# Patient Record
Sex: Female | Born: 1945
Health system: Southern US, Community
[De-identification: ages and names within clinical notes are randomized; demographics above are authoritative.]

## PROBLEM LIST (undated history)

## (undated) DIAGNOSIS — T7840XA Allergy, unspecified, initial encounter: Secondary | ICD-10-CM

## (undated) DIAGNOSIS — E785 Hyperlipidemia, unspecified: Secondary | ICD-10-CM

## (undated) DIAGNOSIS — I493 Ventricular premature depolarization: Secondary | ICD-10-CM

## (undated) DIAGNOSIS — E669 Obesity, unspecified: Secondary | ICD-10-CM

## (undated) DIAGNOSIS — I10 Essential (primary) hypertension: Secondary | ICD-10-CM

## (undated) DIAGNOSIS — M8580 Other specified disorders of bone density and structure, unspecified site: Secondary | ICD-10-CM

## (undated) HISTORY — PX: TMJ ARTHROPLASTY: SHX1066

## (undated) HISTORY — DX: Allergy, unspecified, initial encounter: T78.40XA

## (undated) HISTORY — DX: Other specified disorders of bone density and structure, unspecified site: M85.80

## (undated) HISTORY — DX: Essential (primary) hypertension: I10

## (undated) HISTORY — DX: Hyperlipidemia, unspecified: E78.5

## (undated) HISTORY — DX: Ventricular premature depolarization: I49.3

## (undated) HISTORY — PX: ABDOMINAL HYSTERECTOMY: SHX81

## (undated) HISTORY — PX: TEMPOROMANDIBULAR JOINT SURGERY: SHX35

## (undated) HISTORY — PX: FOOT SURGERY: SHX648

## (undated) HISTORY — DX: Obesity, unspecified: E66.9

## (undated) HISTORY — PX: APPENDECTOMY: SHX54

---

## 1998-10-03 ENCOUNTER — Other Ambulatory Visit: Admission: RE | Admit: 1998-10-03 | Discharge: 1998-10-03 | Payer: Self-pay | Admitting: Family Medicine

## 2002-12-11 ENCOUNTER — Other Ambulatory Visit: Admission: RE | Admit: 2002-12-11 | Discharge: 2002-12-11 | Payer: Self-pay | Admitting: Family Medicine

## 2012-02-25 LAB — HM DEXA SCAN: HM Dexa Scan: NORMAL

## 2012-10-28 ENCOUNTER — Ambulatory Visit (INDEPENDENT_AMBULATORY_CARE_PROVIDER_SITE_OTHER): Payer: Medicare HMO | Admitting: Nurse Practitioner

## 2012-10-28 ENCOUNTER — Encounter: Payer: Self-pay | Admitting: Nurse Practitioner

## 2012-10-28 VITALS — BP 130/61 | HR 62 | Temp 98.4°F | Ht 65.0 in | Wt 173.0 lb

## 2012-10-28 DIAGNOSIS — J309 Allergic rhinitis, unspecified: Secondary | ICD-10-CM | POA: Insufficient documentation

## 2012-10-28 DIAGNOSIS — E785 Hyperlipidemia, unspecified: Secondary | ICD-10-CM | POA: Insufficient documentation

## 2012-10-28 DIAGNOSIS — I10 Essential (primary) hypertension: Secondary | ICD-10-CM | POA: Insufficient documentation

## 2012-10-28 NOTE — Progress Notes (Signed)
Subjective:    Patient ID: Amanda Park, female    DOB: 1946-06-10, 67 y.o.   MRN: [id number]  HPI    Review of Systems     Objective:   Physical Exam        Assessment & Plan:   Subjective:     Amanda Park is a 67 y.o. female.  Chief Complaint: Dyslipidemia Patient presents for evaluation of lipids. Compliance with treatment thus far has been excellent. A repeat fasting lipid profile was done. The patient does use medications that may worsen dyslipidemias (corticosteroids, progestins, anabolic steroids, diuretics, beta-blockers, amiodarone, cyclosporine, olanzapine). The patient exercises frequently.  The patient is not known to have coexisting coronary artery disease.   Cardiac Risk Factors Age > 45-female, > 55-female:  YES  +1  Smoking:   NO  Sig. family hx of CHD*:  YES  +1  Hypertension:   YES  +1  Diabetes:   NO  HDL < 35:   NO  HDL > 59:   NO  Total: 3  *- Significant family history of Coronary Heart Disease per National Cholesterol Education Program = Myocardial Infarction or sudden death at less than 77 years old in  father or other 1st-degree female relative, or less than 34 years old in mother or  other 1st-degree female relative.  Hypertension Patient is here for follow-up of elevated blood pressure. She is exercising and is adherent to a low-salt diet. Blood pressure is well controlled at home. Cardiac symptoms: irregular heart beat. Patient denies chest pain, dyspnea, lower extremity edema, near-syncope and syncope. Cardiovascular risk factors: dyslipidemia and family history of premature cardiovascular disease. Use of agents associated with hypertension: none. History of target organ damage: none. Additional Complaints: Arrhythmia Patient presents for evaluation of fast heart rate. Onset was 3 years ago, and patient reports symptoms have stabilized since that time. Patient also complains of none. The patient denies chest pain, dizziness, fatigue, leg  swelling and palpitations. The patient has a past history of CAD. The patient denies a past history of atrial fibrillation.  The following portions of the patient's history were reviewed and updated as appropriate: allergies, current medications, past family history, past medical history, past social history, past surgical history and problem list.  Review of Systems Pertinent items are noted in HPI.    Objective:    BP 130/61  Pulse 62  Temp(Src) 98.4 F (36.9 C) (Oral)  Ht 5\' 5"  (1.651 m)  Wt 173 lb (78.472 kg)  BMI 28.79 kg/m2  General Appearance:    Alert, cooperative, no distress, appears stated age  Head:    Normocephalic, without obvious abnormality, atraumatic  Eyes:    PERRL, conjunctiva/corneas clear, EOM's intact, fundi    benign, both eyes  Ears:    Normal TM's and external ear canals, both ears  Nose:   Nares normal, septum midline, mucosa normal, no drainage    or sinus tenderness  Throat:   Lips, mucosa, and tongue normal; teeth and gums normal  Neck:   Supple, symmetrical, trachea midline, no adenopathy;    thyroid:  no enlargement/tenderness/nodules; no carotid   bruit or JVD  Back:     Symmetric, no curvature, ROM normal, no CVA tenderness  Lungs:     Clear to auscultation bilaterally, respirations unlabored  Chest Wall:    No tenderness or deformity   Heart:    Regular rate and rhythm, S1 and S2 normal, no murmur, rub   or gallop  Breast Exam:  No tenderness, masses, or nipple abnormality  Abdomen:     Soft, non-tender, bowel sounds active all four quadrants,    no masses, no organomegaly  Genitalia:    Normal female without lesion, discharge or tenderness  Rectal:    Normal tone, normal prostate, no masses or tenderness;   guaiac negative stool  Extremities:   Extremities normal, atraumatic, no cyanosis or edema  Pulses:   2+ and symmetric all extremities  Skin:   Skin color, texture, turgor normal, no rashes or lesions  Lymph nodes:   Cervical,  supraclavicular, and axillary nodes normal  Neurologic:   CNII-XII intact, normal strength, sensation and reflexes    throughout      Assessment:     1. Essential hypertension, benign   2. Other and unspecified hyperlipidemia   3. Allergic rhinitis    4. Sinus Tachycardia       Plan:    CONTINUE ALL MED    Reviewed labs Diet and exercise encouraged  Mary-Margaret Daphine Deutscher, FNP

## 2012-10-28 NOTE — Patient Instructions (Signed)

## 2012-12-29 ENCOUNTER — Other Ambulatory Visit: Payer: Self-pay | Admitting: Family Medicine

## 2013-01-02 MED ORDER — SIMVASTATIN 40 MG PO TABS: ORAL_TABLET | ORAL | Status: DC

## 2013-01-02 MED ORDER — PRAMIPEXOLE DIHYDROCHLORIDE 0.25 MG PO TABS: ORAL_TABLET | ORAL | Status: DC

## 2013-01-02 MED ORDER — ATENOLOL 25 MG PO TABS: 25.0000 mg | ORAL_TABLET | Freq: Every day | ORAL | Status: DC

## 2013-01-02 MED ORDER — OMEPRAZOLE 40 MG PO CPDR: 40.0000 mg | DELAYED_RELEASE_CAPSULE | Freq: Every day | ORAL | Status: DC

## 2013-01-02 NOTE — Telephone Encounter (Signed)
LAST OV 4/14. MAIL ORDER AND WE CAN SEND IN ELECTRONICALLY NOW.  CALLED PT AND VERIFIED MEDS WITH PATIENT. LAST LABS 3/14.

## 2013-03-09 ENCOUNTER — Telehealth: Payer: Self-pay | Admitting: Nurse Practitioner

## 2013-03-09 ENCOUNTER — Ambulatory Visit (INDEPENDENT_AMBULATORY_CARE_PROVIDER_SITE_OTHER): Payer: Medicare HMO | Admitting: Nurse Practitioner

## 2013-03-09 ENCOUNTER — Encounter: Payer: Self-pay | Admitting: Nurse Practitioner

## 2013-03-09 VITALS — BP 149/70 | HR 59 | Temp 97.4°F | Ht 65.0 in | Wt 173.0 lb

## 2013-03-09 DIAGNOSIS — L255 Unspecified contact dermatitis due to plants, except food: Secondary | ICD-10-CM

## 2013-03-09 DIAGNOSIS — E785 Hyperlipidemia, unspecified: Secondary | ICD-10-CM

## 2013-03-09 DIAGNOSIS — I10 Essential (primary) hypertension: Secondary | ICD-10-CM

## 2013-03-09 DIAGNOSIS — L237 Allergic contact dermatitis due to plants, except food: Secondary | ICD-10-CM

## 2013-03-09 MED ORDER — METHYLPREDNISOLONE ACETATE 80 MG/ML IJ SUSP
80.0000 mg | Freq: Once | INTRAMUSCULAR | Status: AC
  Administered 2013-03-09: 80 mg via INTRAMUSCULAR

## 2013-03-09 MED ORDER — PREDNISONE 20 MG PO TABS: ORAL_TABLET | ORAL | Status: DC

## 2013-03-09 NOTE — Telephone Encounter (Signed)
Appointment given for today at 11:45 with Amanda Park

## 2013-03-09 NOTE — Progress Notes (Signed)
  Subjective:    Patient ID: Amanda Park, female    DOB: Feb 05, 1946, 68 y.o.   MRN: [id number]  HPI  [place] in with a rash that started last Wednesday- She has been doing yard work but doesn't recall getting into anything- Rash is spreading and itching.    Review of Systems  All other systems reviewed and are negative.       Objective:   Physical Exam  Constitutional: She appears well-developed and well-nourished.  Cardiovascular: Normal rate and normal heart sounds.   Pulmonary/Chest: Effort normal and breath sounds normal.  Skin:  Erythematous maculopapular lesions in linear pattern on forearms and patchy areas on anterior and posterior trunk     BP 149/70  Pulse 59  Temp(Src) 97.4 F (36.3 C) (Oral)  Ht 5\' 5"  (1.651 m)  Wt 173 lb (78.472 kg)  BMI 28.79 kg/m2      Assessment & Plan:  1. Contact dermatitis due to poison oak Avoid scratching Calamine lotion if helps Cool compresses if helps RTO prn - methylPREDNISolone acetate (DEPO-MEDROL) injection 80 mg; Inject 1 mL (80 mg total) into the muscle once.   Mary-Margaret Daphine Deutscher, FNP  - predniSONE (DELTASONE) 20 MG tablet; 2 PO at same time daily for 5 days.-Do not start until 03/10/13  Dispense: 10 tablet; Refill: 0

## 2013-03-09 NOTE — Patient Instructions (Signed)
Poison Ivy Poison ivy is a inflammation of the skin (contact dermatitis) caused by touching the allergens on the leaves of the ivy plant following previous exposure to the plant. The rash usually appears 48 hours after exposure. The rash is usually bumps (papules) or blisters (vesicles) in a linear pattern. Depending on your own sensitivity, the rash may simply cause redness and itching, or it may also progress to blisters which may break open. These must be well cared for to prevent secondary bacterial (germ) infection, followed by scarring. Keep any open areas dry, clean, dressed, and covered with an antibacterial ointment if needed. The eyes may also get puffy. The puffiness is worst in the morning and gets better as the day progresses. This dermatitis usually heals without scarring, within 2 to 3 weeks without treatment. HOME CARE INSTRUCTIONS  Thoroughly wash with soap and water as soon as you have been exposed to poison ivy. You have about one half hour to remove the plant resin before it will cause the rash. This washing will destroy the oil or antigen on the skin that is causing, or will cause, the rash. Be sure to wash under your fingernails as any plant resin there will continue to spread the rash. Do not rub skin vigorously when washing affected area. Poison ivy cannot spread if no oil from the plant remains on your body. A rash that has progressed to weeping sores will not spread the rash unless you have not washed thoroughly. It is also important to wash any clothes you have been wearing as these may carry active allergens. The rash will return if you wear the unwashed clothing, even several days later. Avoidance of the plant in the future is the best measure. Poison ivy plant can be recognized by the number of leaves. Generally, poison ivy has three leaves with flowering branches on a single stem. Diphenhydramine may be purchased over the counter and used as needed for itching. Do not drive with  this medication if it makes you drowsy.Ask your caregiver about medication for children. SEEK MEDICAL CARE IF:  Open sores develop.  Redness spreads beyond area of rash.  You notice purulent (pus-like) discharge.  You have increased pain.  Other signs of infection develop (such as fever). Document Released: 07/10/2000 Document Revised: 10/05/2011 Document Reviewed: 05/29/2009 Exit[employer] Patient Information 2014 [employer].  

## 2013-03-10 LAB — NMR, LIPOPROFILE
HDL Particle Number: 39.5 umol/L (ref >=30.5)
LDLC SERPL CALC-MCNC: 83 mg/dL (ref <100)
LP-IR Score: 68 — ABNORMAL HIGH (ref <=45)
Small LDL Particle Number: 652 nmol/L — ABNORMAL HIGH (ref <=527)

## 2013-03-10 LAB — CMP14+EGFR
ALT: 15 IU/L (ref 0–32)
AST: 15 IU/L (ref 0–40)
Alkaline Phosphatase: 69 IU/L (ref 39–117)
BUN/Creatinine Ratio: 15 (ref 11–26)
CO2: 22 mmol/L (ref 18–29)
Creatinine, Ser: 0.99 mg/dL (ref 0.57–1.00)
Globulin, Total: 1.7 g/dL (ref 1.5–4.5)
Potassium: 4.4 mmol/L (ref 3.5–5.2)
Sodium: 142 mmol/L (ref 134–144)

## 2013-03-14 ENCOUNTER — Other Ambulatory Visit: Payer: Self-pay

## 2013-03-14 NOTE — Telephone Encounter (Signed)
Last seen 03/09/13 and last labs 03/09/13  MMM   If approved print and have nurse call patient to pick up for mail order

## 2013-03-15 MED ORDER — ATENOLOL 25 MG PO TABS: 25.0000 mg | ORAL_TABLET | Freq: Every day | ORAL | Status: DC

## 2013-03-15 MED ORDER — OMEPRAZOLE 40 MG PO CPDR: 40.0000 mg | DELAYED_RELEASE_CAPSULE | Freq: Every day | ORAL | Status: DC

## 2013-03-15 MED ORDER — SIMVASTATIN 40 MG PO TABS: ORAL_TABLET | ORAL | Status: DC

## 2013-03-15 MED ORDER — PRAMIPEXOLE DIHYDROCHLORIDE 0.25 MG PO TABS: ORAL_TABLET | ORAL | Status: DC

## 2013-03-21 ENCOUNTER — Encounter: Payer: Self-pay | Admitting: Nurse Practitioner

## 2013-03-21 ENCOUNTER — Ambulatory Visit (INDEPENDENT_AMBULATORY_CARE_PROVIDER_SITE_OTHER): Payer: Medicare HMO | Admitting: Nurse Practitioner

## 2013-03-21 VITALS — BP 140/68 | HR 61 | Temp 97.3°F | Ht 65.0 in | Wt 166.0 lb

## 2013-03-21 DIAGNOSIS — I10 Essential (primary) hypertension: Secondary | ICD-10-CM

## 2013-03-21 DIAGNOSIS — Z0000 Encounter for general adult medical examination without abnormal findings: Secondary | ICD-10-CM

## 2013-03-21 DIAGNOSIS — R300 Dysuria: Secondary | ICD-10-CM

## 2013-03-21 DIAGNOSIS — E785 Hyperlipidemia, unspecified: Secondary | ICD-10-CM

## 2013-03-21 LAB — POCT URINALYSIS DIPSTICK
Blood, UA: NEGATIVE
Glucose, UA: NEGATIVE
Leukocytes, UA: NEGATIVE
Nitrite, UA: NEGATIVE
Urobilinogen, UA: NEGATIVE
pH, UA: 5

## 2013-03-21 LAB — POCT UA - MICROSCOPIC ONLY: Crystals, Ur, HPF, POC: NEGATIVE

## 2013-03-21 NOTE — Patient Instructions (Signed)

## 2013-03-21 NOTE — Progress Notes (Signed)
Subjective:    Patient ID: Amanda Park, female    DOB: [birth year], 67 y.o.   MRN: [id number]  Urinary Tract Infection  This is a new problem. The current episode started in the past 7 days. The problem occurs intermittently. The problem has been resolved. The quality of the pain is described as aching. The pain is at a severity of 3/10. The pain is mild. There has been no fever. She is not sexually active. There is no history of pyelonephritis. Associated symptoms include urgency. She has tried nothing for the symptoms.  Hypertension This is a chronic problem. The current episode started more than 1 year ago. The problem is unchanged. The problem is controlled. Pertinent negatives include no chest pain, headaches, malaise/fatigue, palpitations, peripheral edema or shortness of breath. There are no associated agents to hypertension. Risk factors for coronary artery disease include dyslipidemia, family history and post-menopausal state. Past treatments include beta blockers. The current treatment provides moderate improvement. There are no compliance problems.   Hyperlipidemia This is a chronic problem. The current episode started more than 1 year ago. The problem is controlled. Recent lipid tests were reviewed and are normal. She has no history of diabetes, hypothyroidism, liver disease or obesity. Pertinent negatives include no chest pain or shortness of breath. Current antihyperlipidemic treatment includes statins. The current treatment provides moderate improvement of lipids. There are no compliance problems.  Risk factors for coronary artery disease include family history and hypertension.  GERD Omeprazole daily keeps symptoms under control RLS mirapex works great- If she forgets to take it she has to get up in the middle of the night and walk the floor.  Review of Systems  Constitutional: Negative for malaise/fatigue.  Respiratory: Negative for shortness of breath.   Cardiovascular: Negative  for chest pain and palpitations.  Genitourinary: Positive for urgency.  Neurological: Negative for headaches.  All other systems reviewed and are negative.       Objective:   Physical Exam  Constitutional: She is oriented to person, place, and time. She appears well-developed and well-nourished.  HENT:  Nose: Nose normal.  Mouth/Throat: Oropharynx is clear and moist.  Eyes: EOM are normal.  Neck: Trachea normal, normal range of motion and full passive range of motion without pain. Neck supple. No JVD present. Carotid bruit is not present. No thyromegaly present.  Cardiovascular: Normal rate, regular rhythm, normal heart sounds and intact distal pulses.  Exam reveals no gallop and no friction rub.   No murmur heard. Pulmonary/Chest: Effort normal and breath sounds normal.  Abdominal: Soft. Bowel sounds are normal. She exhibits no distension and no mass. There is no tenderness.  Musculoskeletal: Normal range of motion.  Lymphadenopathy:    She has no cervical adenopathy.  Neurological: She is alert and oriented to person, place, and time. She has normal reflexes.  Skin: Skin is warm and dry.  Psychiatric: She has a normal mood and affect. Her behavior is normal. Judgment and thought content normal.   BP 140/68  Pulse 61  Temp(Src) 97.3 F (36.3 C) (Oral)  Ht 5\' 5"  (1.651 m)  Wt 166 lb (75.297 kg)  BMI 27.62 kg/m2  Results for orders placed in visit on 03/21/13  POCT URINALYSIS DIPSTICK      Result Value Range   Color, UA yellow     Clarity, UA clear     Glucose, UA neg     Bilirubin, UA neg     Ketones, UA neg     Spec Grav,  UA 1.015     Blood, UA neg     pH, UA 5.0     Protein, UA neg     Urobilinogen, UA negative     Nitrite, UA neg     Leukocytes, UA Negative    POCT UA - MICROSCOPIC ONLY      Result Value Range   WBC, Ur, HPF, POC occ     RBC, urine, microscopic 2-10     Bacteria, U Microscopic few     Mucus, UA trace     Epithelial cells, urine per micros  occ     Crystals, Ur, HPF, POC neg     Casts, Ur, LPF, POC occ     Yeast, UA neg           Assessment & Plan:  1. Annual physical exam Discussed labs with patient at appointment Health maintenance reviewed  2. Dysuria Urine clear Force fluids - POCT urinalysis dipstick - POCT UA - Microscopic Only  3. Hypertension Low NA+ diet  4. Hyperlipidemia Low fat diet and exercise  Mary-Margaret Daphine Deutscher, FNP

## 2013-06-19 ENCOUNTER — Other Ambulatory Visit (INDEPENDENT_AMBULATORY_CARE_PROVIDER_SITE_OTHER): Payer: Medicare HMO

## 2013-06-19 DIAGNOSIS — I10 Essential (primary) hypertension: Secondary | ICD-10-CM

## 2013-06-19 DIAGNOSIS — E785 Hyperlipidemia, unspecified: Secondary | ICD-10-CM

## 2013-06-20 LAB — CMP14+EGFR
Albumin: 4.3 g/dL (ref 3.6–4.8)
BUN: 15 mg/dL (ref 8–27)
CO2: 23 mmol/L (ref 18–29)
Chloride: 107 mmol/L (ref 97–108)
Glucose: 105 mg/dL — ABNORMAL HIGH (ref 65–99)
Total Protein: 6 g/dL (ref 6.0–8.5)

## 2013-06-20 LAB — NMR, LIPOPROFILE
Cholesterol: 151 mg/dL (ref <200)
HDL Cholesterol by NMR: 46 mg/dL (ref >=40)
LDL Particle Number: 1242 nmol/L — ABNORMAL HIGH (ref <1000)
LDLC SERPL CALC-MCNC: 85 mg/dL (ref <100)
Triglycerides by NMR: 100 mg/dL (ref <150)

## 2013-06-26 ENCOUNTER — Other Ambulatory Visit: Payer: Self-pay | Admitting: Nurse Practitioner

## 2013-06-27 ENCOUNTER — Telehealth: Payer: Self-pay | Admitting: *Deleted

## 2013-06-27 MED ORDER — OMEPRAZOLE 40 MG PO CPDR: 40.0000 mg | DELAYED_RELEASE_CAPSULE | Freq: Every day | ORAL | Status: DC

## 2013-06-27 MED ORDER — SIMVASTATIN 40 MG PO TABS: ORAL_TABLET | ORAL | Status: DC

## 2013-06-27 MED ORDER — PRAMIPEXOLE DIHYDROCHLORIDE 0.25 MG PO TABS: ORAL_TABLET | ORAL | Status: DC

## 2013-06-27 MED ORDER — ATENOLOL 25 MG PO TABS: 25.0000 mg | ORAL_TABLET | Freq: Every day | ORAL | Status: DC

## 2013-06-27 NOTE — Telephone Encounter (Signed)
rx ready to pick up

## 2013-06-27 NOTE — Telephone Encounter (Signed)
Wants these printed for mail order, route to nurse to call her when ready

## 2013-06-27 NOTE — Telephone Encounter (Signed)
Message copied by Baltazar Apo on Tue Jun 27, 2013 11:44 AM ------      Message from: Bennie Pierini      Created: Wed Jun 21, 2013  2:19 PM       All labs look good- continue all meds and recheck in 3 months ------

## 2013-06-27 NOTE — Telephone Encounter (Signed)
Patient aware to pick up 

## 2013-06-27 NOTE — Telephone Encounter (Signed)
Pt notified of labs

## 2013-06-28 ENCOUNTER — Other Ambulatory Visit: Payer: Self-pay | Admitting: Nurse Practitioner

## 2013-09-18 ENCOUNTER — Other Ambulatory Visit: Payer: Self-pay | Admitting: Nurse Practitioner

## 2013-09-22 ENCOUNTER — Telehealth: Payer: Self-pay | Admitting: Nurse Practitioner

## 2013-12-13 ENCOUNTER — Ambulatory Visit (INDEPENDENT_AMBULATORY_CARE_PROVIDER_SITE_OTHER): Payer: Medicare HMO | Admitting: Nurse Practitioner

## 2013-12-13 ENCOUNTER — Encounter: Payer: Self-pay | Admitting: Nurse Practitioner

## 2013-12-13 VITALS — BP 136/72 | HR 61 | Temp 97.8°F | Ht 65.0 in | Wt 169.0 lb

## 2013-12-13 DIAGNOSIS — K219 Gastro-esophageal reflux disease without esophagitis: Secondary | ICD-10-CM | POA: Insufficient documentation

## 2013-12-13 DIAGNOSIS — J309 Allergic rhinitis, unspecified: Secondary | ICD-10-CM

## 2013-12-13 DIAGNOSIS — I10 Essential (primary) hypertension: Secondary | ICD-10-CM

## 2013-12-13 DIAGNOSIS — G2581 Restless legs syndrome: Secondary | ICD-10-CM

## 2013-12-13 DIAGNOSIS — E785 Hyperlipidemia, unspecified: Secondary | ICD-10-CM

## 2013-12-13 MED ORDER — ATENOLOL 25 MG PO TABS: 25.0000 mg | ORAL_TABLET | Freq: Every day | ORAL | Status: DC

## 2013-12-13 MED ORDER — OMEPRAZOLE 40 MG PO CPDR: DELAYED_RELEASE_CAPSULE | ORAL | Status: DC

## 2013-12-13 MED ORDER — CETIRIZINE HCL 10 MG PO TABS: 10.0000 mg | ORAL_TABLET | Freq: Every day | ORAL | Status: DC

## 2013-12-13 MED ORDER — PRAMIPEXOLE DIHYDROCHLORIDE 0.25 MG PO TABS: ORAL_TABLET | ORAL | Status: DC

## 2013-12-13 MED ORDER — SIMVASTATIN 40 MG PO TABS: ORAL_TABLET | ORAL | Status: DC

## 2013-12-13 NOTE — Progress Notes (Signed)
Subjective:    Patient ID: Amanda Park, female    DOB: 02/08/1946, 68 y.o.   MRN: [id number]  Hypertension This is a chronic problem. The current episode started more than 1 year ago. The problem has been resolved since onset. The problem is controlled. Pertinent negatives include no chest pain, headaches, neck pain, palpitations, peripheral edema or shortness of breath. There are no associated agents to hypertension. Risk factors for coronary artery disease include dyslipidemia, post-menopausal state and sedentary lifestyle. Past treatments include beta blockers. The current treatment provides mild improvement. Compliance problems include diet and exercise.   Hyperlipidemia This is a chronic problem. The current episode started more than 1 year ago. The problem is uncontrolled. Recent lipid tests were reviewed and are high. She has no history of diabetes, hypothyroidism or obesity. There are no known factors aggravating her hyperlipidemia. Pertinent negatives include no chest pain or shortness of breath. Current antihyperlipidemic treatment includes statins. The current treatment provides mild improvement of lipids. Compliance problems include adherence to diet and adherence to exercise.  Risk factors for coronary artery disease include dyslipidemia, hypertension and post-menopausal.  RLS Mirapex daily helps with symptoms GERD Omeprazole keeps symtoms under control  Review of Systems  Respiratory: Negative for shortness of breath.   Cardiovascular: Negative for chest pain and palpitations.  Musculoskeletal: Negative for neck pain.  Neurological: Negative for headaches.       Objective:   Physical Exam  Constitutional: She is oriented to person, place, and time. She appears well-developed and well-nourished.  HENT:  Nose: Nose normal.  Mouth/Throat: Oropharynx is clear and moist.  Eyes: EOM are normal.  Neck: Trachea normal, normal range of motion and full passive range of motion  without pain. Neck supple. No JVD present. Carotid bruit is not present. No thyromegaly present.  Cardiovascular: Normal rate, regular rhythm, normal heart sounds and intact distal pulses.  Exam reveals no gallop and no friction rub.   No murmur heard. Pulmonary/Chest: Effort normal and breath sounds normal.  Abdominal: Soft. Bowel sounds are normal. She exhibits no distension and no mass. There is no tenderness.  Musculoskeletal: Normal range of motion.  Lymphadenopathy:    She has no cervical adenopathy.  Neurological: She is alert and oriented to person, place, and time. She has normal reflexes.  Skin: Skin is warm and dry.  Psychiatric: She has a normal mood and affect. Her behavior is normal. Judgment and thought content normal.   BP 136/72  Pulse 61  Temp(Src) 97.8 F (36.6 C) (Oral)  Ht 5\' 5"  (1.651 m)  Wt 169 lb (76.658 kg)  BMI 28.12 kg/m2        Assessment & Plan:  1. Hyperlipidemia LDL goal < 100 Low fat diet and exercise - simvastatin (ZOCOR) 40 MG tablet; TAKE ONE DAILY  Dispense: 90 tablet; Refill: 1  2. Essential hypertension, benign Low NA+ diet - atenolol (TENORMIN) 25 MG tablet; Take 1 tablet (25 mg total) by mouth daily.  Dispense: 90 tablet; Refill: 1  3. RLS (restless legs syndrome) Keep legs warm at night - pramipexole (MIRAPEX) 0.25 MG tablet; TAKE ONE QHS  Dispense: 90 tablet; Refill: 1  4. GERD (gastroesophageal reflux disease) Avoid spicy and fatty foods Do not eat 2 hours prior to bedtime - omeprazole (PRILOSEC) 40 MG capsule; TAKE 1 CAPSULE EVERY DAY  Dispense: 90 capsule; Refill: 1  5. Allergic rhinitis Avoid allergens - cetirizine (ZYRTEC) 10 MG tablet; Take 1 tablet (10 mg total) by mouth daily.  Dispense:  90 tablet; Refill: 1  Health maintenance reviewed Labs discussed at appointment- had drawn at work Follow up in 3 months  [place], [place]

## 2013-12-13 NOTE — Patient Instructions (Signed)

## 2014-01-08 ENCOUNTER — Telehealth: Payer: Self-pay | Admitting: Nurse Practitioner

## 2014-01-08 ENCOUNTER — Encounter: Payer: Self-pay | Admitting: Nurse Practitioner

## 2014-01-08 ENCOUNTER — Ambulatory Visit (INDEPENDENT_AMBULATORY_CARE_PROVIDER_SITE_OTHER): Payer: Medicare HMO | Admitting: Nurse Practitioner

## 2014-01-08 VITALS — BP 133/62 | HR 64 | Temp 97.6°F | Ht 65.0 in | Wt 169.0 lb

## 2014-01-08 DIAGNOSIS — L500 Allergic urticaria: Secondary | ICD-10-CM

## 2014-01-08 MED ORDER — METHYLPREDNISOLONE ACETATE 80 MG/ML IJ SUSP
80.0000 mg | Freq: Once | INTRAMUSCULAR | Status: AC
  Administered 2014-01-08: 80 mg via INTRAMUSCULAR

## 2014-01-08 MED ORDER — PREDNISONE 20 MG PO TABS: ORAL_TABLET | ORAL | Status: DC

## 2014-01-08 NOTE — Progress Notes (Signed)
   Subjective:    Patient ID: Amanda Park, female    DOB: 09/25/1945, 68 y.o.   MRN: [id number]  HPI Patient in today with a fine red rash all over body that she noticed this morning. Only area itching is face. She says that face feels flushed. She had shell fiSh Saturday night. Can't think of anything else she has had other then some cherries that she has never been allergic to in the past. She took some zyrtec this morning which has helped.    Review of Systems  Constitutional: Negative.   Respiratory: Negative.   Cardiovascular: Negative.   Genitourinary: Negative.   Skin: Positive for rash.  All other systems reviewed and are negative.      Objective:   Physical Exam  Constitutional: She is oriented to person, place, and time. She appears well-developed and well-nourished.  Cardiovascular: Normal rate and normal heart sounds.   Pulmonary/Chest: Effort normal and breath sounds normal.  Neurological: She is alert and oriented to person, place, and time.  Skin: Skin is warm.  Fine small maculopapular rash all over body    BP 133/62  Pulse 64  Temp(Src) 97.6 F (36.4 C) (Oral)  Ht 5\' 5"  (1.651 m)  Wt 169 lb (76.658 kg)  BMI 28.12 kg/m2       Assessment & Plan:  1. Allergic urticaria Meds ordered this encounter  Medications  . methylPREDNISolone acetate (DEPO-MEDROL) injection 80 mg    Sig:   . predniSONE (DELTASONE) 20 MG tablet    Sig: 2 po qd X5 days- start tomorrow    Dispense:  10 tablet    Refill:  0    Order Specific Question:  Supervising Provider    Answer:  Chipper Herb [[id number]]   Avoid scratching Avoid Cherry RTO prn Zyrtec if needed - methylPREDNISolone acetate (DEPO-MEDROL) injection 80 mg; Inject 1 mL (80 mg total) into the muscle once.  Mary-Margaret Hassell Done, FNP

## 2014-01-08 NOTE — Patient Instructions (Signed)

## 2014-01-09 NOTE — Telephone Encounter (Signed)
Tried to call patient at work and at home. Her work says she has gone on vacation. Chart review shows that prednisone rx was received by [facility] 01/08/14.

## 2014-03-22 ENCOUNTER — Other Ambulatory Visit (INDEPENDENT_AMBULATORY_CARE_PROVIDER_SITE_OTHER): Payer: Medicare HMO

## 2014-03-22 ENCOUNTER — Other Ambulatory Visit: Payer: Self-pay | Admitting: *Deleted

## 2014-03-22 DIAGNOSIS — E785 Hyperlipidemia, unspecified: Secondary | ICD-10-CM

## 2014-03-22 DIAGNOSIS — I10 Essential (primary) hypertension: Secondary | ICD-10-CM

## 2014-03-23 LAB — CMP14+EGFR
ALK PHOS: 55 IU/L (ref 39–117)
ALT: 21 IU/L (ref 0–32)
AST: 18 IU/L (ref 0–40)
Albumin/Globulin Ratio: 2.2 (ref 1.1–2.5)
Albumin: 4.2 g/dL (ref 3.6–4.8)
BUN / CREAT RATIO: 16 (ref 11–26)
BUN: 16 mg/dL (ref 8–27)
CO2: 22 mmol/L (ref 18–29)
CREATININE: 1.01 mg/dL — AB (ref 0.57–1.00)
Calcium: 9.1 mg/dL (ref 8.7–10.3)
Chloride: 105 mmol/L (ref 97–108)
GFR calc Af Amer: 66 mL/min/{1.73_m2} (ref >59)
GFR calc non Af Amer: 57 mL/min/{1.73_m2} — ABNORMAL LOW (ref >59)
GLOBULIN, TOTAL: 1.9 g/dL (ref 1.5–4.5)
Glucose: 99 mg/dL (ref 65–99)
Potassium: 4.5 mmol/L (ref 3.5–5.2)
Sodium: 144 mmol/L (ref 134–144)
Total Bilirubin: 0.3 mg/dL (ref 0.0–1.2)
Total Protein: 6.1 g/dL (ref 6.0–8.5)

## 2014-03-23 LAB — NMR, LIPOPROFILE
Cholesterol: 163 mg/dL (ref 100–199)
HDL CHOLESTEROL BY NMR: 46 mg/dL (ref >39)
HDL PARTICLE NUMBER: 35.2 umol/L (ref >=30.5)
LDL Particle Number: 1016 nmol/L — ABNORMAL HIGH (ref <1000)
LDL Size: 20.8 nm (ref >20.5)
LDLC SERPL CALC-MCNC: 96 mg/dL (ref 0–99)
LP-IR Score: 69 — ABNORMAL HIGH (ref <=45)
Small LDL Particle Number: 296 nmol/L (ref <=527)
TRIGLYCERIDES BY NMR: 107 mg/dL (ref 0–149)

## 2014-03-26 ENCOUNTER — Ambulatory Visit (INDEPENDENT_AMBULATORY_CARE_PROVIDER_SITE_OTHER): Payer: Medicare HMO | Admitting: Nurse Practitioner

## 2014-03-26 ENCOUNTER — Encounter (INDEPENDENT_AMBULATORY_CARE_PROVIDER_SITE_OTHER): Payer: Self-pay

## 2014-03-26 ENCOUNTER — Encounter: Payer: Self-pay | Admitting: Nurse Practitioner

## 2014-03-26 VITALS — BP 123/69 | HR 72 | Temp 97.1°F | Ht 65.0 in | Wt 168.0 lb

## 2014-03-26 DIAGNOSIS — G2581 Restless legs syndrome: Secondary | ICD-10-CM

## 2014-03-26 DIAGNOSIS — I10 Essential (primary) hypertension: Secondary | ICD-10-CM

## 2014-03-26 DIAGNOSIS — E785 Hyperlipidemia, unspecified: Secondary | ICD-10-CM

## 2014-03-26 DIAGNOSIS — J302 Other seasonal allergic rhinitis: Secondary | ICD-10-CM

## 2014-03-26 DIAGNOSIS — Z0000 Encounter for general adult medical examination without abnormal findings: Secondary | ICD-10-CM

## 2014-03-26 DIAGNOSIS — K219 Gastro-esophageal reflux disease without esophagitis: Secondary | ICD-10-CM

## 2014-03-26 MED ORDER — SIMVASTATIN 40 MG PO TABS: ORAL_TABLET | ORAL | Status: DC

## 2014-03-26 MED ORDER — OMEPRAZOLE 40 MG PO CPDR: DELAYED_RELEASE_CAPSULE | ORAL | Status: DC

## 2014-03-26 MED ORDER — ATENOLOL 25 MG PO TABS: 25.0000 mg | ORAL_TABLET | Freq: Every day | ORAL | Status: DC

## 2014-03-26 NOTE — Addendum Note (Signed)
Addended by: [name] on: 03/26/2014 12:22 PM   Modules accepted: Level of Service

## 2014-03-26 NOTE — Patient Instructions (Signed)

## 2014-03-26 NOTE — Progress Notes (Signed)
Subjective:    Patient ID: Amanda Park, female    DOB: 1945/12/03, 68 y.o.   MRN: [id number]  HPI Patient for complete physical without pap.  Having no current issues.  [place] and hit head in July and went to ER at [facility] where she had a CT of head that was normal.  Is having periodic headaches in the frontal taking Aleve or Naproxyn with relief.  Denies any vision changes. Hypertension This is a chronic problem. The current episode started more than 1 year ago. The problem has been resolved since onset. The problem is controlled. Pertinent negatives include no chest pain, headaches, neck pain, palpitations, peripheral edema or shortness of breath. There are no associated agents to hypertension. Risk factors for coronary artery disease include dyslipidemia, post-menopausal state and sedentary lifestyle. Past treatments include beta blockers. The current treatment provides mild improvement. Compliance problems include diet and exercise.   Hyperlipidemia This is a chronic problem. The current episode started more than 1 year ago. The problem is uncontrolled. Recent lipid tests were reviewed and are high. She has no history of diabetes, hypothyroidism or obesity. There are no known factors aggravating her hyperlipidemia. Pertinent negatives include no chest pain or shortness of breath. Current antihyperlipidemic treatment includes statins. The current treatment provides mild improvement of lipids. Compliance problems include adherence to diet and adherence to exercise.  Risk factors for coronary artery disease include dyslipidemia, hypertension and post-menopausal.  RLS Mirapex daily helps with symptoms GERD Omeprazole keeps symtoms under control  Review of Systems  Respiratory: Negative for shortness of breath.   Cardiovascular: Negative for chest pain and palpitations.  Musculoskeletal: Negative for neck pain.  Neurological: Negative for headaches.       Objective:   Physical Exam    Constitutional: She is oriented to person, place, and time. She appears well-developed and well-nourished.  HENT:  Head: Normocephalic.  Right Ear: Hearing, tympanic membrane, external ear and ear canal normal.  Left Ear: Hearing, tympanic membrane, external ear and ear canal normal.  Nose: Nose normal.  Mouth/Throat: Uvula is midline, oropharynx is clear and moist and mucous membranes are normal.  Eyes: Conjunctivae and EOM are normal. Pupils are equal, round, and reactive to light.  Neck: Trachea normal, normal range of motion and full passive range of motion without pain. Neck supple. No JVD present. Carotid bruit is not present. No thyromegaly present.  Cardiovascular: Normal rate, regular rhythm, normal heart sounds and intact distal pulses.  Exam reveals no gallop and no friction rub.   No murmur heard. Pulmonary/Chest: Effort normal and breath sounds normal. She has no wheezes. She has no rales. Right breast exhibits no inverted nipple, no mass, no nipple discharge, no skin change and no tenderness. Left breast exhibits no inverted nipple, no mass, no nipple discharge, no skin change and no tenderness.  Abdominal: Soft. Bowel sounds are normal. She exhibits no distension and no mass. There is no tenderness.  Musculoskeletal: Normal range of motion.  Lymphadenopathy:    She has no cervical adenopathy.  Neurological: She is alert and oriented to person, place, and time. She has normal reflexes.  Skin: Skin is warm and dry.  Psychiatric: She has a normal mood and affect. Her behavior is normal. Judgment and thought content normal.   BP 123/69  Pulse 72  Temp(Src) 97.1 F (36.2 C) (Oral)  Ht 5\' 5"  (1.651 m)  Wt 168 lb (76.204 kg)  BMI 27.96 kg/m2        Assessment &  Plan:   1. RLS (restless legs syndrome)   2. Hyperlipidemia with target LDL less than 100   3. Gastroesophageal reflux disease without esophagitis   4. Essential hypertension, benign   5. Other seasonal allergic  rhinitis    No orders of the defined types were placed in this encounter.   Meds ordered this encounter  Medications  . atenolol (TENORMIN) 25 MG tablet    Sig: Take 1 tablet (25 mg total) by mouth daily.    Dispense:  90 tablet    Refill:  1    Order Specific Question:  Supervising Provider    Answer:  Chipper Herb [[id number]]  . simvastatin (ZOCOR) 40 MG tablet    Sig: TAKE ONE DAILY    Dispense:  90 tablet    Refill:  1    Order Specific Question:  Supervising Provider    Answer:  Chipper Herb [[id number]]  . omeprazole (PRILOSEC) 40 MG capsule    Sig: TAKE 1 CAPSULE EVERY DAY    Dispense:  90 capsule    Refill:  1    Order Specific Question:  Supervising Provider    Answer:  Chipper Herb [[id number]]    Labs discussed at appointment Health maintenance reviewed Diet and exercise encouraged Continue all meds Follow up  In 3 moanths   [place], FNP

## 2014-03-28 ENCOUNTER — Telehealth: Payer: Self-pay | Admitting: Family Medicine

## 2014-03-28 NOTE — Telephone Encounter (Signed)
Message copied by Waverly Ferrari on Wed Mar 28, 2014 11:41 AM ------      Message from: [name]      Created: Fri Mar 23, 2014  2:50 PM       Kidney and liver function stable      cholesterol looks great      Continue current meds- low fat diet and exercise and recheck in 3 months       ------

## 2014-06-08 ENCOUNTER — Encounter: Payer: Self-pay | Admitting: Family Medicine

## 2014-06-08 ENCOUNTER — Ambulatory Visit (INDEPENDENT_AMBULATORY_CARE_PROVIDER_SITE_OTHER): Payer: Medicare HMO | Admitting: Family Medicine

## 2014-06-08 ENCOUNTER — Encounter (INDEPENDENT_AMBULATORY_CARE_PROVIDER_SITE_OTHER): Payer: Self-pay

## 2014-06-08 VITALS — BP 125/55 | HR 59 | Temp 97.0°F | Ht 65.0 in | Wt 168.0 lb

## 2014-06-08 DIAGNOSIS — I10 Essential (primary) hypertension: Secondary | ICD-10-CM

## 2014-06-08 DIAGNOSIS — G2581 Restless legs syndrome: Secondary | ICD-10-CM

## 2014-06-08 DIAGNOSIS — K529 Noninfective gastroenteritis and colitis, unspecified: Secondary | ICD-10-CM

## 2014-06-08 DIAGNOSIS — E785 Hyperlipidemia, unspecified: Secondary | ICD-10-CM

## 2014-06-08 MED ORDER — PRAMIPEXOLE DIHYDROCHLORIDE 0.25 MG PO TABS: ORAL_TABLET | ORAL | Status: DC

## 2014-06-08 MED ORDER — SIMVASTATIN 40 MG PO TABS: ORAL_TABLET | ORAL | Status: DC

## 2014-06-08 MED ORDER — ONDANSETRON HCL 4 MG PO TABS: 4.0000 mg | ORAL_TABLET | Freq: Three times a day (TID) | ORAL | Status: DC | PRN

## 2014-06-08 NOTE — Progress Notes (Signed)
   Subjective:    Patient ID: Amanda Park, female    DOB: November 04, 1945, 68 y.o.   MRN: [id number]  HPI 68 year old female who presents with vomiting and diarrhea for the past 4 days. She ate at church last Sunday and she and several other people have developed these same symptoms. She has been drinking tomato juice with added salt. She seemed to get better about mid week but symptoms returned yesterday. She denies fever chills flank pain, or any other chronic gastrointestinal diseases    Review of Systems  Constitutional: Negative.   HENT: Negative.   Eyes: Negative.   Respiratory: Negative.   Cardiovascular: Negative.   Gastrointestinal: Positive for vomiting and diarrhea.  Endocrine: Negative.   Genitourinary: Negative.   Musculoskeletal:       Bilateral thumb pain  Skin: Rash: not true rash but healing intertrigo.  Hematological: Negative.   Psychiatric/Behavioral: Negative.        Objective:   Physical Exam  Constitutional: She is oriented to person, place, and time. She appears well-developed and well-nourished.  Eyes: Conjunctivae and EOM are normal.  Neck: Normal range of motion. Neck supple.  Cardiovascular: Normal rate, regular rhythm and normal heart sounds.   Pulmonary/Chest: Effort normal and breath sounds normal.  Abdominal: Soft. Bowel sounds are normal.  Musculoskeletal: Normal range of motion.  Neurological: She is alert and oriented to person, place, and time. She has normal reflexes.  Skin: Skin is warm and dry.  Psychiatric: She has a normal mood and affect. Her behavior is normal. Thought content normal.    BP 125/55 mmHg  Pulse 59  Temp(Src) 97 F (36.1 C) (Oral)  Ht 5\' 5"  (1.651 m)  Wt 168 lb (76.204 kg)  BMI 27.96 kg/m2      Assessment & Plan:  1. RLS (restless legs syndrome) Symptoms are greatly improved on Mirapex. We will continue - pramipexole (MIRAPEX) 0.25 MG tablet; TAKE ONE QHS  Dispense: 90 tablet; Refill: 1  2. Essential  hypertension, benign   3. Hyperlipidemia with target LDL less than 100  - simvastatin (ZOCOR) 40 MG tablet; TAKE ONE DAILY  Dispense: 90 tablet; Refill: 1  4. Noninfectious gastroenteritis, unspecified Clear liquids for 24 hours (like 7-Up, ginger ale, Sprite, Jello, frozen pops) Full liquids the second 24-hours (like potato soup, tomato soup, chicken noodle soup) Bland diet the third 24-hours (boiled and baked foods, no fried or greasy foods) Avoid milk, cheese, ice cream and dairy products for 72 hours. Avoid caffeine (cola drinks, coffee, tea, Mountain Dew, Mellow Yellow) Take in small amounts, but frequently. Tylenol and/or Advil as needed for aches pains and fever  Wardell Honour MD - ondansetron (ZOFRAN) 4 MG tablet; Take 1 tablet (4 mg total) by mouth every 8 (eight) hours as needed for nausea or vomiting.  Dispense: 12 tablet; Refill: 0

## 2014-09-07 ENCOUNTER — Other Ambulatory Visit (INDEPENDENT_AMBULATORY_CARE_PROVIDER_SITE_OTHER): Payer: Commercial Managed Care - HMO

## 2014-09-07 ENCOUNTER — Other Ambulatory Visit: Payer: Self-pay | Admitting: Nurse Practitioner

## 2014-09-07 DIAGNOSIS — E785 Hyperlipidemia, unspecified: Secondary | ICD-10-CM

## 2014-09-07 DIAGNOSIS — I10 Essential (primary) hypertension: Secondary | ICD-10-CM | POA: Diagnosis not present

## 2014-09-07 NOTE — Progress Notes (Signed)
Lab only 

## 2014-09-08 LAB — CMP14+EGFR
A/G RATIO: 2.1 (ref 1.1–2.5)
ALT: 18 IU/L (ref 0–32)
AST: 15 IU/L (ref 0–40)
Albumin: 4.2 g/dL (ref 3.6–4.8)
Alkaline Phosphatase: 61 IU/L (ref 39–117)
BUN/Creatinine Ratio: 15 (ref 11–26)
BUN: 15 mg/dL (ref 8–27)
Bilirubin Total: 0.3 mg/dL (ref 0.0–1.2)
CO2: 21 mmol/L (ref 18–29)
Calcium: 9 mg/dL (ref 8.7–10.3)
Chloride: 108 mmol/L (ref 97–108)
Creatinine, Ser: 1.02 mg/dL — ABNORMAL HIGH (ref 0.57–1.00)
GFR, EST AFRICAN AMERICAN: 65 mL/min/{1.73_m2} (ref >59)
GFR, EST NON AFRICAN AMERICAN: 56 mL/min/{1.73_m2} — AB (ref >59)
GLUCOSE: 101 mg/dL — AB (ref 65–99)
Globulin, Total: 2 g/dL (ref 1.5–4.5)
POTASSIUM: 4.5 mmol/L (ref 3.5–5.2)
Sodium: 141 mmol/L (ref 134–144)
TOTAL PROTEIN: 6.2 g/dL (ref 6.0–8.5)

## 2014-09-08 LAB — NMR, LIPOPROFILE
CHOLESTEROL: 161 mg/dL (ref 100–199)
HDL CHOLESTEROL BY NMR: 45 mg/dL (ref >39)
HDL PARTICLE NUMBER: 36.2 umol/L (ref >=30.5)
LDL Particle Number: 985 nmol/L (ref <1000)
LDL Size: 20.9 nm (ref >20.5)
LDL-C: 94 mg/dL (ref 0–99)
LP-IR SCORE: 63 — AB (ref <=45)
Small LDL Particle Number: 303 nmol/L (ref <=527)
Triglycerides by NMR: 110 mg/dL (ref 0–149)

## 2014-09-11 ENCOUNTER — Encounter: Payer: Self-pay | Admitting: Nurse Practitioner

## 2014-09-11 ENCOUNTER — Ambulatory Visit (INDEPENDENT_AMBULATORY_CARE_PROVIDER_SITE_OTHER): Payer: Commercial Managed Care - HMO | Admitting: Nurse Practitioner

## 2014-09-11 VITALS — BP 136/69 | HR 65 | Temp 97.0°F | Ht 65.0 in | Wt 174.0 lb

## 2014-09-11 DIAGNOSIS — E785 Hyperlipidemia, unspecified: Secondary | ICD-10-CM | POA: Diagnosis not present

## 2014-09-11 DIAGNOSIS — G2581 Restless legs syndrome: Secondary | ICD-10-CM | POA: Diagnosis not present

## 2014-09-11 DIAGNOSIS — I10 Essential (primary) hypertension: Secondary | ICD-10-CM | POA: Diagnosis not present

## 2014-09-11 DIAGNOSIS — K219 Gastro-esophageal reflux disease without esophagitis: Secondary | ICD-10-CM

## 2014-09-11 MED ORDER — PRAMIPEXOLE DIHYDROCHLORIDE 0.25 MG PO TABS: ORAL_TABLET | ORAL | Status: DC

## 2014-09-11 MED ORDER — ATENOLOL 25 MG PO TABS: 25.0000 mg | ORAL_TABLET | Freq: Every day | ORAL | Status: DC

## 2014-09-11 MED ORDER — OMEPRAZOLE 40 MG PO CPDR: DELAYED_RELEASE_CAPSULE | ORAL | Status: DC

## 2014-09-11 MED ORDER — SIMVASTATIN 40 MG PO TABS: ORAL_TABLET | ORAL | Status: DC

## 2014-09-11 NOTE — Addendum Note (Signed)
Addended by: [name] on: 09/11/2014 05:51 PM   Modules accepted: Orders

## 2014-09-11 NOTE — Progress Notes (Signed)
Subjective:    Patient ID: Amanda Park, female    DOB: [birth year], 69 y.o.   MRN: [id number]  Patient is here for chronic disease follow up. No acute complaints.   Hypertension This is a chronic problem. The current episode started more than 1 year ago. The problem is controlled. Pertinent negatives include no chest pain, headaches, neck pain, palpitations or shortness of breath. Risk factors for coronary artery disease include post-menopausal state and dyslipidemia. Past treatments include beta blockers. There are no compliance problems.   Hyperlipidemia This is a chronic problem. The current episode started more than 1 year ago. Recent lipid tests were reviewed and are normal. Pertinent negatives include no chest pain or shortness of breath. Current antihyperlipidemic treatment includes statins. The current treatment provides significant improvement of lipids. There are no compliance problems.  Risk factors for coronary artery disease include dyslipidemia, hypertension and post-menopausal.  RLS Mirapex daily helps with symptoms.  GERD Omeprazole keeps symtoms under control.   Review of Systems  Constitutional: Negative.   HENT: Negative.   Eyes: Negative.   Respiratory: Negative.  Negative for shortness of breath.   Cardiovascular: Negative for chest pain and palpitations.  Endocrine: Negative.   Genitourinary: Negative.   Musculoskeletal: Negative.  Negative for neck pain.  Allergic/Immunologic: Negative.   Neurological: Negative.  Negative for headaches.  Hematological: Negative.   Psychiatric/Behavioral: Negative.        Objective:   Physical Exam  Constitutional: She is oriented to person, place, and time. She appears well-developed and well-nourished.  HENT:  Nose: Nose normal.  Mouth/Throat: Oropharynx is clear and moist.  Eyes: EOM are normal.  Neck: Trachea normal, normal range of motion and full passive range of motion without pain. Neck supple. No JVD present.  Carotid bruit is not present. No thyromegaly present.  Cardiovascular: Normal rate, regular rhythm, normal heart sounds and intact distal pulses.  Exam reveals no gallop and no friction rub.   No murmur heard. Pulmonary/Chest: Effort normal and breath sounds normal. No respiratory distress.  Abdominal: Soft. Bowel sounds are normal. She exhibits no distension and no mass. There is no tenderness.  Musculoskeletal: Normal range of motion.  Lymphadenopathy:    She has no cervical adenopathy.  Neurological: She is alert and oriented to person, place, and time. She has normal reflexes.  Skin: Skin is warm and dry.  Psychiatric: She has a normal mood and affect. Her behavior is normal. Judgment and thought content normal.   BP 136/69 mmHg  Pulse 65  Temp(Src) 97 F (36.1 C) (Oral)  Ht 5' 5"  (1.651 m)  Wt 174 lb (78.926 kg)  BMI 28.96 kg/m2   Results for orders placed or performed in visit on 09/07/14  CMP14+EGFR  Result Value Ref Range   Glucose 101 (H) 65 - 99 mg/dL   BUN 15 8 - 27 mg/dL   Creatinine, Ser 1.02 (H) 0.57 - 1.00 mg/dL   GFR calc non Af Amer 56 (L) >59 mL/min/1.73   GFR calc Af Amer 65 >59 mL/min/1.73   BUN/Creatinine Ratio 15 11 - 26   Sodium 141 134 - 144 mmol/L   Potassium 4.5 3.5 - 5.2 mmol/L   Chloride 108 97 - 108 mmol/L   CO2 21 18 - 29 mmol/L   Calcium 9.0 8.7 - 10.3 mg/dL   Total Protein 6.2 6.0 - 8.5 g/dL   Albumin 4.2 3.6 - 4.8 g/dL   Globulin, Total 2.0 1.5 - 4.5 g/dL   Albumin/Globulin Ratio 2.1  1.1 - 2.5   Bilirubin Total 0.3 0.0 - 1.2 mg/dL   Alkaline Phosphatase 61 39 - 117 IU/L   AST 15 0 - 40 IU/L   ALT 18 0 - 32 IU/L  NMR, lipoprofile  Result Value Ref Range   LDL Particle Number [id number] <1000 nmol/L   LDL-C 94 0 - 99 mg/dL   HDL Cholesterol by NMR 45 >39 mg/dL   Triglycerides by NMR 110 0 - 149 mg/dL   Cholesterol 161 100 - 199 mg/dL   HDL Particle Number 36.2 >=30.5 umol/L   Small LDL Particle Number [id number] <=527 nmol/L   LDL Size 20.9 >20.5  nm   LP-IR Score 63 (H) <=45        Assessment & Plan:   1. Hyperlipidemia with target LDL less than 100 Low fat diet - simvastatin (ZOCOR) 40 MG tablet; TAKE ONE DAILY  Dispense: 90 tablet; Refill: 1  2. Gastroesophageal reflux disease without esophagitis Avoid spicy foods Do not eat 2 hours prior to bedtime - omeprazole (PRILOSEC) 40 MG capsule; TAKE 1 CAPSULE EVERY DAY  Dispense: 90 capsule; Refill: 1  3. RLS (restless legs syndrome) Keep legs warm - pramipexole (MIRAPEX) 0.25 MG tablet; TAKE ONE QHS  Dispense: 90 tablet; Refill: 1  4. Essential hypertension, benign Do not add salt  to diet - atenolol (TENORMIN) 25 MG tablet; Take 1 tablet (25 mg total) by mouth daily.  Dispense: 90 tablet; Refill: 1    Labs discussed at [place] maintenance reviewed Diet and exercise encouraged Continue all meds Follow up  In 6 months   [place], FNP

## 2014-09-11 NOTE — Patient Instructions (Signed)

## 2014-10-23 ENCOUNTER — Other Ambulatory Visit: Payer: Self-pay | Admitting: Family Medicine

## 2014-11-07 ENCOUNTER — Ambulatory Visit (INDEPENDENT_AMBULATORY_CARE_PROVIDER_SITE_OTHER): Payer: Commercial Managed Care - HMO | Admitting: Physician Assistant

## 2014-11-07 ENCOUNTER — Ambulatory Visit (INDEPENDENT_AMBULATORY_CARE_PROVIDER_SITE_OTHER): Payer: Commercial Managed Care - HMO

## 2014-11-07 ENCOUNTER — Encounter: Payer: Self-pay | Admitting: Physician Assistant

## 2014-11-07 VITALS — BP 131/65 | HR 60 | Temp 96.8°F | Ht 65.0 in | Wt 169.0 lb

## 2014-11-07 DIAGNOSIS — Y92009 Unspecified place in unspecified non-institutional (private) residence as the place of occurrence of the external cause: Principal | ICD-10-CM

## 2014-11-07 DIAGNOSIS — W19XXXA Unspecified fall, initial encounter: Secondary | ICD-10-CM

## 2014-11-07 DIAGNOSIS — M25532 Pain in left wrist: Secondary | ICD-10-CM | POA: Diagnosis not present

## 2014-11-07 NOTE — Patient Instructions (Signed)
*  TAKE ALEVE 1-2 TABLETS TWICE DAILY FOR PAIN AND INFLAMMATION. IF NO IMPROVEMENT AFTER 1-2 WEEKS, F/U FOR REASSESSMENT     Wrist Pain Wrist injuries are frequent in adults and children. A sprain is an injury to the ligaments that hold your bones together. A strain is an injury to muscle or muscle cord-like structures (tendons) from stretching or pulling. Generally, when wrists are moderately tender to touch following a fall or injury, a break in the bone (fracture) may be present. Most wrist sprains or strains are better in 3 to 5 days, but complete healing may take several weeks. HOME CARE INSTRUCTIONS   Put ice on the injured area.  Put ice in a plastic bag.  Place a towel between your skin and the bag.  Leave the ice on for 15-20 minutes, 3-4 times a day, for the first 2 days, or as directed by your health care provider.  Keep your arm raised above the level of your heart whenever possible to reduce swelling and pain.  Rest the injured area for at least 48 hours or as directed by your health care provider.  If a splint or elastic bandage has been applied, use it for as long as directed by your health care provider or until seen by a health care provider for a follow-up exam.  Only take over-the-counter or prescription medicines for pain, discomfort, or fever as directed by your health care provider.  Keep all follow-up appointments. You may need to follow up with a specialist or have follow-up X-rays. Improvement in pain level is not a guarantee that you did not fracture a bone in your wrist. The only way to determine whether or not you have a broken bone is by X-ray. SEEK IMMEDIATE MEDICAL CARE IF:   Your fingers are swollen, very red, white, or cold and blue.  Your fingers are numb or tingling.  You have increasing pain.  You have difficulty moving your fingers. MAKE SURE YOU:   Understand these instructions.  Will watch your condition.  Will get help right away if you  are not doing well or get worse. Document Released: 04/22/2005 Document Revised: 07/18/2013 Document Reviewed: 09/03/2010 [facility] Patient Information 2015 [place], [place]. This information is not intended to replace advice given to you by your health care provider. Make sure you discuss any questions you have with your health care provider.

## 2014-11-07 NOTE — Progress Notes (Signed)
   Subjective:    Patient ID: Amanda Park, female    DOB: 03-Jul-1946, 69 y.o.   MRN: [id number]  HPI 69 y/o female presents for left wrist pain after falling yesterday in her kitchen. She is unsure of position of fall.     Review of Systems  Musculoskeletal: Positive for myalgias and joint swelling (left wrist ).       Swelling and bruise on left hand/wrist   Skin: Positive for color change (bruising ). Negative for wound.       Objective:   Physical Exam  Constitutional: She is oriented to person, place, and time. She appears well-developed and well-nourished.  Musculoskeletal: She exhibits edema and tenderness.  Ecchymosis and erythema on left dorsal surface of wrist   Neurological: She is alert and oriented to person, place, and time.  Skin: There is erythema.  Nursing note and vitals reviewed.         Assessment & Plan:  1. Left wrist sprain: Rest, ice, compression with brace x 24 hrs a day for 1-2 weeks. Aleve 1-2 tablets BID. F/U in 2 weeks if no improvement. Instructions given.

## 2014-11-29 ENCOUNTER — Encounter: Payer: Self-pay | Admitting: Physician Assistant

## 2014-11-29 ENCOUNTER — Ambulatory Visit (INDEPENDENT_AMBULATORY_CARE_PROVIDER_SITE_OTHER): Payer: Commercial Managed Care - HMO | Admitting: Physician Assistant

## 2014-11-29 VITALS — BP 135/68 | HR 64 | Temp 97.0°F | Ht 65.0 in | Wt 171.0 lb

## 2014-11-29 DIAGNOSIS — T148 Other injury of unspecified body region: Secondary | ICD-10-CM | POA: Diagnosis not present

## 2014-11-29 DIAGNOSIS — W57XXXA Bitten or stung by nonvenomous insect and other nonvenomous arthropods, initial encounter: Secondary | ICD-10-CM | POA: Diagnosis not present

## 2014-11-29 MED ORDER — TRIAMCINOLONE ACETONIDE 0.1 % EX CREA: 1.0000 "application " | TOPICAL_CREAM | Freq: Two times a day (BID) | CUTANEOUS | Status: DC

## 2014-11-29 MED ORDER — DOXYCYCLINE HYCLATE 100 MG PO TABS: 100.0000 mg | ORAL_TABLET | Freq: Two times a day (BID) | ORAL | Status: DC

## 2014-11-29 NOTE — Patient Instructions (Signed)
Tick Bite Information Ticks are insects that attach themselves to the skin. There are many types of ticks. Common types include wood ticks and deer ticks. Sometimes, ticks carry diseases that can make a person very ill. The most common places for ticks to attach themselves are the scalp, neck, armpits, waist, and groin.  HOW CAN YOU PREVENT TICK BITES? Take these steps to help prevent tick bites when you are outdoors:  Wear long sleeves and long pants.  Wear white clothes so you can see ticks more easily.  Tuck your pant legs into your socks.  If walking on a trail, stay in the middle of the trail to avoid brushing against bushes.  Avoid walking through areas with long grass.  Put bug spray on all skin that is showing and along boot tops, pant legs, and sleeve cuffs.  Check clothes, hair, and skin often and before going inside.  Brush off any ticks that are not attached.  Take a shower or bath as soon as possible after being outdoors. HOW SHOULD YOU REMOVE A TICK? Ticks should be removed as soon as possible to help prevent diseases. 1. If latex gloves are available, put them on before trying to remove a tick. 2. Use tweezers to grasp the tick as close to the skin as possible. You may also use curved forceps or a tick removal tool. Grasp the tick as close to its head as possible. Avoid grasping the tick on its body. 3. Pull gently upward until the tick lets go. Do not twist the tick or jerk it suddenly. This may break off the tick's head or mouth parts. 4. Do not squeeze or crush the tick's body. This could force disease-carrying fluids from the tick into your body. 5. After the tick is removed, wash the bite area and your hands with soap and water or alcohol. 6. Apply a small amount of antiseptic cream or ointment to the bite site. 7. Wash any tools that were used. Do not try to remove a tick by applying a hot match, petroleum jelly, or fingernail polish to the tick. These methods do  not work. They may also increase the chances of disease being spread from the tick bite. WHEN SHOULD YOU SEEK HELP? Contact your health care provider if you are unable to remove a tick or if a part of the tick breaks off in the skin. After a tick bite, you need to watch for signs and symptoms of diseases that can be spread by ticks. Contact your health care provider if you develop any of the following:  Fever.  Rash.  Redness and puffiness (swelling) in the area of the tick bite.  Tender, puffy lymph glands.  Watery poop (diarrhea).  Weight loss.  Cough.  Feeling more tired than normal (fatigue).  Muscle, joint, or bone pain.  Belly (abdominal) pain.  Headache.  Change in your level of consciousness.  Trouble walking or moving your legs.  Loss of feeling (numbness) in the legs.  Loss of movement (paralysis).  Shortness of breath.  Confusion.  Throwing up (vomiting) many times. Document Released: 10/07/2009 Document Revised: 03/15/2013 Document Reviewed: 12/21/2012 [employer] Patient Information 2015 [employer]. This information is not intended to replace advice given to you by your health care provider. Make sure you discuss any questions you have with your health care provider.  

## 2014-11-29 NOTE — Progress Notes (Signed)
   Subjective:    Patient ID: Amanda Park, female    DOB: [birth year], 69 y.o.   MRN: [id number]  HPI 69 y/o female presents s/p tick bite on suprapubic area. She found the tick 5 days ago. She is unsure how long the tick was attached. She has continued to have significant itch and inflammation. Has been takng OTC Zyrtec and using neosporin.     Review of Systems  Skin:       Redness, inflammation, itch localized at site of tick bite on suprapubic area  All other systems reviewed and are negative.      Objective:   Physical Exam  Skin:  Erythema, ecchymosis and inflammation at site of tick bite. Right suprapubic area. Approximately 2cm x 2 cm in size   Nursing note and vitals reviewed.         Assessment & Plan:  1. Tick bite - Doxycycline 100mg  BID x 14 days  - triamcinolone cream (KENALOG) 0.1 %; Apply 1 application topically 2 (two) times daily.  Dispense: 80 g; Refill: 0  - Stop using Neosporin  Instructions given to patient regarding s/s of RMSF and Lyme Disease     Breelyn Icard A. Benjamin Stain PA-C

## 2014-12-03 ENCOUNTER — Telehealth: Payer: Self-pay | Admitting: Physician Assistant

## 2014-12-03 NOTE — Telephone Encounter (Signed)
Patient states that she is only itching where the tick bite was and the only thing that is helping to relieve is a ice pack. The cream is not helping with the itching. Patient is taking zyrtec nightly and is still not getting any relief. This has been bothering her since Thursday and not coming from the antibiotic.

## 2014-12-03 NOTE — Telephone Encounter (Signed)
Patient aware and verbalizes understanding. 

## 2014-12-03 NOTE — Telephone Encounter (Signed)
She is taking Doxycycline d/t tick bite for Lyme and RMSF prophylaxis. Continue to take antibiotic if she can and take Zyrtec 10mg  BID or Benadryl to help with itching. If she has throat swelling or hives, immediately stop medication

## 2014-12-03 NOTE — Telephone Encounter (Signed)
She will have some continued itching from the tick bite. She can increase the Zyrtec to twice daily or take Benadryl q 4-6 hours and use the Triamcinolone TID. Doxycycline will help with inflammation also. Make sure she is not still apply Neosporin and she should be using a mild soap such as Dove.

## 2015-03-29 ENCOUNTER — Ambulatory Visit (INDEPENDENT_AMBULATORY_CARE_PROVIDER_SITE_OTHER): Payer: Commercial Managed Care - HMO | Admitting: Nurse Practitioner

## 2015-03-29 ENCOUNTER — Ambulatory Visit (INDEPENDENT_AMBULATORY_CARE_PROVIDER_SITE_OTHER): Payer: Commercial Managed Care - HMO

## 2015-03-29 ENCOUNTER — Encounter: Payer: Self-pay | Admitting: Nurse Practitioner

## 2015-03-29 VITALS — BP 117/62 | HR 63 | Temp 96.8°F | Ht 65.0 in | Wt 168.0 lb

## 2015-03-29 DIAGNOSIS — Z23 Encounter for immunization: Secondary | ICD-10-CM | POA: Diagnosis not present

## 2015-03-29 DIAGNOSIS — I10 Essential (primary) hypertension: Secondary | ICD-10-CM | POA: Diagnosis not present

## 2015-03-29 DIAGNOSIS — Z0000 Encounter for general adult medical examination without abnormal findings: Secondary | ICD-10-CM | POA: Diagnosis not present

## 2015-03-29 DIAGNOSIS — Z780 Asymptomatic menopausal state: Secondary | ICD-10-CM | POA: Diagnosis not present

## 2015-03-29 DIAGNOSIS — K219 Gastro-esophageal reflux disease without esophagitis: Secondary | ICD-10-CM | POA: Diagnosis not present

## 2015-03-29 DIAGNOSIS — Z1211 Encounter for screening for malignant neoplasm of colon: Secondary | ICD-10-CM

## 2015-03-29 DIAGNOSIS — G2581 Restless legs syndrome: Secondary | ICD-10-CM | POA: Diagnosis not present

## 2015-03-29 DIAGNOSIS — N951 Menopausal and female climacteric states: Secondary | ICD-10-CM

## 2015-03-29 DIAGNOSIS — E785 Hyperlipidemia, unspecified: Secondary | ICD-10-CM | POA: Diagnosis not present

## 2015-03-29 DIAGNOSIS — Z01419 Encounter for gynecological examination (general) (routine) without abnormal findings: Secondary | ICD-10-CM | POA: Diagnosis not present

## 2015-03-29 DIAGNOSIS — Z6827 Body mass index (BMI) 27.0-27.9, adult: Secondary | ICD-10-CM | POA: Diagnosis not present

## 2015-03-29 LAB — POCT URINALYSIS DIPSTICK
Bilirubin, UA: NEGATIVE
Blood, UA: NEGATIVE
GLUCOSE UA: NEGATIVE
KETONES UA: NEGATIVE
Nitrite, UA: NEGATIVE
Protein, UA: NEGATIVE
SPEC GRAV UA: 1.025
UROBILINOGEN UA: NEGATIVE
pH, UA: 5

## 2015-03-29 LAB — POCT UA - MICROSCOPIC ONLY
BACTERIA, U MICROSCOPIC: NEGATIVE
CASTS, UR, LPF, POC: NEGATIVE
CRYSTALS, UR, HPF, POC: NEGATIVE
Mucus, UA: NEGATIVE
RBC, urine, microscopic: NEGATIVE
Yeast, UA: NEGATIVE

## 2015-03-29 MED ORDER — PRAMIPEXOLE DIHYDROCHLORIDE 0.25 MG PO TABS: ORAL_TABLET | ORAL | Status: DC

## 2015-03-29 MED ORDER — OMEPRAZOLE 40 MG PO CPDR: DELAYED_RELEASE_CAPSULE | ORAL | Status: DC

## 2015-03-29 MED ORDER — SIMVASTATIN 40 MG PO TABS: ORAL_TABLET | ORAL | Status: DC

## 2015-03-29 MED ORDER — ATENOLOL 25 MG PO TABS: 25.0000 mg | ORAL_TABLET | Freq: Every day | ORAL | Status: DC

## 2015-03-29 NOTE — Addendum Note (Signed)
Addended by: Earlene Plater on: 03/29/2015 12:11 PM   Modules accepted: Miquel Dunn

## 2015-03-29 NOTE — Addendum Note (Signed)
Addended by: Rolena Infante on: 03/29/2015 04:53 PM   Modules accepted: Orders

## 2015-03-29 NOTE — Patient Instructions (Signed)

## 2015-03-29 NOTE — Progress Notes (Signed)
Subjective:    Patient ID: Amanda Park, female    DOB: Jul 20, 1946, 69 y.o.   MRN: [id number]  Patient is here for annual physical exam, pap and chronic disease follow up. No acute complaints.   Hypertension This is a chronic problem. The current episode started more than 1 year ago. The problem is controlled. Pertinent negatives include no chest pain, headaches, neck pain, palpitations or shortness of breath. Risk factors for coronary artery disease include post-menopausal state and dyslipidemia. Past treatments include beta blockers. There are no compliance problems.   Hyperlipidemia This is a chronic problem. The current episode started more than 1 year ago. Recent lipid tests were reviewed and are normal. Pertinent negatives include no chest pain or shortness of breath. Current antihyperlipidemic treatment includes statins. The current treatment provides significant improvement of lipids. There are no compliance problems.  Risk factors for coronary artery disease include dyslipidemia, hypertension and post-menopausal.  RLS Mirapex daily helps with symptoms.  GERD Omeprazole keeps symtoms under control.   Review of Systems  Constitutional: Negative.   HENT: Negative.   Eyes: Negative.   Respiratory: Negative.  Negative for shortness of breath.   Cardiovascular: Negative for chest pain and palpitations.  Endocrine: Negative.   Genitourinary: Negative.   Musculoskeletal: Negative.  Negative for neck pain.  Allergic/Immunologic: Negative.   Neurological: Negative.  Negative for headaches.  Hematological: Negative.   Psychiatric/Behavioral: Negative.        Objective:   Physical Exam  Constitutional: She is oriented to person, place, and time. She appears well-developed and well-nourished.  HENT:  Head: Normocephalic.  Right Ear: Hearing, tympanic membrane, external ear and ear canal normal.  Left Ear: Hearing, tympanic membrane, external ear and ear canal normal.  Nose: Nose  normal.  Mouth/Throat: Uvula is midline and oropharynx is clear and moist.  Eyes: Conjunctivae and EOM are normal. Pupils are equal, round, and reactive to light.  Neck: Trachea normal, normal range of motion and full passive range of motion without pain. Neck supple. No JVD present. Carotid bruit is not present. No thyroid mass and no thyromegaly present.  Cardiovascular: Normal rate, regular rhythm, normal heart sounds and intact distal pulses.  Exam reveals no gallop and no friction rub.   No murmur heard. Pulmonary/Chest: Effort normal and breath sounds normal. No respiratory distress. Right breast exhibits no inverted nipple, no mass, no nipple discharge, no skin change and no tenderness. Left breast exhibits no inverted nipple, no mass, no nipple discharge, no skin change and no tenderness.  Abdominal: Soft. Bowel sounds are normal. She exhibits no distension and no mass. There is no tenderness.  Genitourinary: Vagina normal and uterus normal. No breast swelling, tenderness, discharge or bleeding.  bimanual exam-No adnexal masses or tenderness. Vaginal cuff intact  Musculoskeletal: Normal range of motion.  Lymphadenopathy:    She has no cervical adenopathy.  Neurological: She is alert and oriented to person, place, and time. She has normal reflexes.  Skin: Skin is warm and dry.  Flesh colored slightly raised 3cm annular lesion left lowe lateral leg  Psychiatric: She has a normal mood and affect. Her behavior is normal. Judgment and thought content normal.   BP 117/62 mmHg  Pulse 63  Temp(Src) 96.8 F (36 C) (Oral)  Ht _0  (1.651 m)  Wt 168 lb (76.204 kg)  BMI 27.96 kg/m2   Results for orders placed or performed in visit on 03/29/15  POCT UA - Microscopic Only  Result Value Ref Range  WBC, Ur, HPF, POC 10-12    RBC, urine, microscopic neg    Bacteria, U Microscopic neg    Mucus, UA neg    Epithelial cells, urine per micros occ    Crystals, Ur, HPF, POC neg    Casts, Ur,  LPF, POC neg    Yeast, UA neg   POCT urinalysis dipstick  Result Value Ref Range   Color, UA yellow    Clarity, UA clear    Glucose, UA neg    Bilirubin, UA neg    Ketones, UA neg    Spec Grav, UA 1.025    Blood, UA neg    pH, UA 5.0    Protein, UA neg    Urobilinogen, UA negative    Nitrite, UA neg    Leukocytes, UA small (1+) (A) Negative   Chest x ray- no cardiopulmonary disorders noted  EKG- sinus bradycardia-Mary-Margaret Hassell Done, FNP       Assessment & Plan:   1. Annual physical exam - POCT UA - Microscopic Only - POCT urinalysis dipstick - Urine culture - CBC with Differential/Platelet - Thyroid Panel With TSH  2. Encounter for routine gynecological examination - Pap IG (Image Guided)  3. Essential hypertension, benign Do not add salt to diet - atenolol (TENORMIN) 25 MG tablet; Take 1 tablet (25 mg total) by mouth daily.  Dispense: 90 tablet; Refill: 1 - CMP14+EGFR - DG Chest 2 View; Future - EKG 12-Lead  4. Gastroesophageal reflux disease without esophagitis Avoid spicy foods Do not eat 2 hours prior to bedtime - omeprazole (PRILOSEC) 40 MG capsule; TAKE 1 CAPSULE EVERY DAY  Dispense: 90 capsule; Refill: 1  5. Hyperlipidemia with target LDL less than 100 Low fat diet - Lipid panel - simvastatin (ZOCOR) 40 MG tablet; TAKE ONE DAILY  Dispense: 90 tablet; Refill: 1  6. RLS (restless legs syndrome) - pramipexole (MIRAPEX) 0.25 MG tablet; TAKE ONE QHS  Dispense: 90 tablet; Refill: 1  7. BMI 27.0-27.9,adult Discussed diet and exercise for person with BMI >25 Will recheck weight in 3-6 months    Referral made for colonoscopy Labs pending Health maintenance reviewed Diet and exercise encouraged Continue all meds Follow up  In 6 months   Mull[place], FNP

## 2015-03-30 LAB — THYROID PANEL WITH TSH
Free Thyroxine Index: 2.9 (ref 1.2–4.9)
T3 Uptake Ratio: 31 % (ref 24–39)
T4, Total: 9.4 ug/dL (ref 4.5–12.0)
TSH: 2.3 u[IU]/mL (ref 0.450–4.500)

## 2015-03-30 LAB — CBC WITH DIFFERENTIAL/PLATELET
BASOS ABS: 0 10*3/uL (ref 0.0–0.2)
Basos: 0 %
EOS (ABSOLUTE): 0.1 10*3/uL (ref 0.0–0.4)
Eos: 1 %
HEMATOCRIT: 40.1 % (ref 34.0–46.6)
Hemoglobin: 13.4 g/dL (ref 11.1–15.9)
Immature Grans (Abs): 0 10*3/uL (ref 0.0–0.1)
Immature Granulocytes: 0 %
LYMPHS ABS: 1.8 10*3/uL (ref 0.7–3.1)
Lymphs: 27 %
MCH: 29.3 pg (ref 26.6–33.0)
MCHC: 33.4 g/dL (ref 31.5–35.7)
MCV: 88 fL (ref 79–97)
MONOS ABS: 0.3 10*3/uL (ref 0.1–0.9)
Monocytes: 5 %
Neutrophils Absolute: 4.4 10*3/uL (ref 1.4–7.0)
Neutrophils: 67 %
Platelets: 198 10*3/uL (ref 150–379)
RBC: 4.57 x10E6/uL (ref 3.77–5.28)
RDW: 14.2 % (ref 12.3–15.4)
WBC: 6.7 10*3/uL (ref 3.4–10.8)

## 2015-03-30 LAB — CMP14+EGFR
A/G RATIO: 2.3 (ref 1.1–2.5)
ALT: 14 IU/L (ref 0–32)
AST: 13 IU/L (ref 0–40)
Albumin: 4.4 g/dL (ref 3.6–4.8)
Alkaline Phosphatase: 61 IU/L (ref 39–117)
BILIRUBIN TOTAL: 0.4 mg/dL (ref 0.0–1.2)
BUN/Creatinine Ratio: 16 (ref 11–26)
BUN: 16 mg/dL (ref 8–27)
CHLORIDE: 105 mmol/L (ref 97–108)
CO2: 22 mmol/L (ref 18–29)
Calcium: 9.4 mg/dL (ref 8.7–10.3)
Creatinine, Ser: 0.97 mg/dL (ref 0.57–1.00)
GFR calc Af Amer: 69 mL/min/{1.73_m2} (ref >59)
GFR, EST NON AFRICAN AMERICAN: 60 mL/min/{1.73_m2} (ref >59)
GLOBULIN, TOTAL: 1.9 g/dL (ref 1.5–4.5)
Glucose: 86 mg/dL (ref 65–99)
POTASSIUM: 4.3 mmol/L (ref 3.5–5.2)
SODIUM: 144 mmol/L (ref 134–144)
Total Protein: 6.3 g/dL (ref 6.0–8.5)

## 2015-03-30 LAB — LIPID PANEL
CHOL/HDL RATIO: 3.2 ratio (ref 0.0–4.4)
Cholesterol, Total: 154 mg/dL (ref 100–199)
HDL: 48 mg/dL (ref >39)
LDL Calculated: 81 mg/dL (ref 0–99)
Triglycerides: 123 mg/dL (ref 0–149)
VLDL Cholesterol Cal: 25 mg/dL (ref 5–40)

## 2015-03-31 LAB — URINE CULTURE

## 2015-04-03 LAB — PAP IG (IMAGE GUIDED): PAP Smear Comment: 0

## 2015-04-04 ENCOUNTER — Telehealth: Payer: Self-pay | Admitting: Nurse Practitioner

## 2015-04-04 DIAGNOSIS — I10 Essential (primary) hypertension: Secondary | ICD-10-CM

## 2015-04-04 DIAGNOSIS — K219 Gastro-esophageal reflux disease without esophagitis: Secondary | ICD-10-CM

## 2015-04-04 DIAGNOSIS — E785 Hyperlipidemia, unspecified: Secondary | ICD-10-CM

## 2015-04-04 DIAGNOSIS — G2581 Restless legs syndrome: Secondary | ICD-10-CM

## 2015-04-04 MED ORDER — OMEPRAZOLE 40 MG PO CPDR: DELAYED_RELEASE_CAPSULE | ORAL | Status: DC

## 2015-04-04 MED ORDER — ATENOLOL 25 MG PO TABS: 25.0000 mg | ORAL_TABLET | Freq: Every day | ORAL | Status: DC

## 2015-04-04 MED ORDER — PRAMIPEXOLE DIHYDROCHLORIDE 0.25 MG PO TABS: ORAL_TABLET | ORAL | Status: DC

## 2015-04-04 MED ORDER — SIMVASTATIN 40 MG PO TABS: ORAL_TABLET | ORAL | Status: DC

## 2015-04-04 NOTE — Telephone Encounter (Signed)
rx sent to mail order 

## 2015-04-04 NOTE — Telephone Encounter (Signed)
Patient aware and rx's at [facility] have been cancelled

## 2015-05-07 ENCOUNTER — Ambulatory Visit (INDEPENDENT_AMBULATORY_CARE_PROVIDER_SITE_OTHER): Payer: Commercial Managed Care - HMO

## 2015-05-07 DIAGNOSIS — Z23 Encounter for immunization: Secondary | ICD-10-CM | POA: Diagnosis not present

## 2015-05-10 DIAGNOSIS — H2513 Age-related nuclear cataract, bilateral: Secondary | ICD-10-CM | POA: Diagnosis not present

## 2015-05-10 DIAGNOSIS — H5210 Myopia, unspecified eye: Secondary | ICD-10-CM | POA: Diagnosis not present

## 2015-05-10 DIAGNOSIS — H5200 Hypermetropia, unspecified eye: Secondary | ICD-10-CM | POA: Diagnosis not present

## 2015-06-11 ENCOUNTER — Ambulatory Visit (INDEPENDENT_AMBULATORY_CARE_PROVIDER_SITE_OTHER): Payer: Commercial Managed Care - HMO | Admitting: Nurse Practitioner

## 2015-06-11 ENCOUNTER — Encounter: Payer: Self-pay | Admitting: Nurse Practitioner

## 2015-06-11 VITALS — BP 142/88 | HR 64 | Temp 97.0°F | Ht 65.0 in | Wt 173.0 lb

## 2015-06-11 DIAGNOSIS — H109 Unspecified conjunctivitis: Secondary | ICD-10-CM | POA: Diagnosis not present

## 2015-06-11 MED ORDER — OFLOXACIN 0.3 % OP SOLN: 1.0000 [drp] | Freq: Four times a day (QID) | OPHTHALMIC | Status: DC

## 2015-06-11 NOTE — Patient Instructions (Signed)
Bacterial Conjunctivitis Bacterial conjunctivitis (commonly called pink eye) is redness, soreness, or puffiness (inflammation) of the white part of your eye. It is caused by a germ called bacteria. These germs can easily spread from person to person (contagious). Your eye often will become red or pink. Your eye may also become irritated, watery, or have a thick discharge.  HOME CARE   Apply a cool, clean washcloth over closed eyelids. Do this for 10-20 minutes, 3-4 times a day while you have pain.  Gently wipe away any fluid coming from the eye with a warm, wet washcloth or cotton ball.  Wash your hands often with soap and water. Use paper towels to dry your hands.  Do not share towels or washcloths.  Change or wash your pillowcase every day.  Do not use eye makeup until the infection is gone.  Do not use machines or drive if your vision is blurry.  Stop using contact lenses. Do not use them again until your doctor says it is okay.  Do not touch the tip of the eye drop bottle or medicine tube with your fingers when you put medicine on the eye. GET HELP RIGHT AWAY IF:   Your eye is not better after 3 days of starting your medicine.  You have a yellowish fluid coming out of the eye.  You have more pain in the eye.  Your eye redness is spreading.  Your vision becomes blurry.  You have a fever or lasting symptoms for more than 2-3 days.  You have a fever and your symptoms suddenly get worse.  You have pain in the face.  Your face gets red or puffy (swollen). MAKE SURE YOU:   Understand these instructions.  Will watch this condition.  Will get help right away if you are not doing well or get worse.   This information is not intended to replace advice given to you by your health care provider. Make sure you discuss any questions you have with your health care provider.   Document Released: 04/21/2008 Document Revised: 06/29/2012 Document Reviewed: 03/18/2012 Elsevier  Interactive Patient Education 2016 Elsevier Inc.  

## 2015-06-11 NOTE — Progress Notes (Signed)
   Subjective:    Patient ID: Amanda Park, female    DOB: 08/05/1945, 69 y.o.   MRN: [id number]  HPI Patient in c/o red eyes with drainage- they have been matted together the last 2 mornings. Clear drainage currently. Very itchy.     Review of Systems  Constitutional: Negative.   HENT: Negative.   Eyes: Positive for discharge, redness and itching. Negative for visual disturbance.  Cardiovascular: Negative.   Genitourinary: Negative.   Neurological: Negative.   Hematological: Negative.   Psychiatric/Behavioral: Negative.   All other systems reviewed and are negative.      Objective:   Physical Exam  Constitutional: She is oriented to person, place, and time. She appears well-developed and well-nourished.  Eyes: Pupils are equal, round, and reactive to light. Right eye exhibits discharge (clear). Left eye exhibits discharge (clear).  Bilateral scleral injection with erythematous conjunctiva  Cardiovascular: Normal rate, regular rhythm and normal heart sounds.   Pulmonary/Chest: Effort normal and breath sounds normal.  Neurological: She is alert and oriented to person, place, and time.  Skin: Skin is warm.  Psychiatric: She has a normal mood and affect. Her behavior is normal. Judgment and thought content normal.    BP 142/88 mmHg  Pulse 64  Temp(Src) 97 F (36.1 C) (Oral)  Ht 5\' 5"  (1.651 m)  Wt 173 lb (78.472 kg)  BMI 28.79 kg/m2        Assessment & Plan:  1. Bilateral conjunctivitis Cool compresses Do not rub Good handwashing RTO prn  - ofloxacin (OCUFLOX) 0.3 % ophthalmic solution; Place 1 drop into both eyes 4 (four) times daily.  Dispense: 5 mL; Refill: 0   Mary-Margaret Hassell Done, FNP

## 2015-06-17 DIAGNOSIS — Z1231 Encounter for screening mammogram for malignant neoplasm of breast: Secondary | ICD-10-CM | POA: Diagnosis not present

## 2015-06-17 LAB — HM MAMMOGRAPHY: HM Mammogram: NEGATIVE

## 2015-06-25 ENCOUNTER — Encounter: Payer: Self-pay | Admitting: *Deleted

## 2015-08-12 ENCOUNTER — Telehealth: Payer: Self-pay | Admitting: Nurse Practitioner

## 2015-09-13 ENCOUNTER — Ambulatory Visit (INDEPENDENT_AMBULATORY_CARE_PROVIDER_SITE_OTHER): Payer: Commercial Managed Care - HMO | Admitting: Nurse Practitioner

## 2015-09-13 ENCOUNTER — Encounter: Payer: Self-pay | Admitting: Nurse Practitioner

## 2015-09-13 VITALS — BP 138/74 | HR 58 | Temp 96.9°F | Ht 65.0 in | Wt 176.0 lb

## 2015-09-13 DIAGNOSIS — Z6829 Body mass index (BMI) 29.0-29.9, adult: Secondary | ICD-10-CM

## 2015-09-13 DIAGNOSIS — G2581 Restless legs syndrome: Secondary | ICD-10-CM | POA: Diagnosis not present

## 2015-09-13 DIAGNOSIS — K219 Gastro-esophageal reflux disease without esophagitis: Secondary | ICD-10-CM | POA: Diagnosis not present

## 2015-09-13 DIAGNOSIS — E785 Hyperlipidemia, unspecified: Secondary | ICD-10-CM

## 2015-09-13 DIAGNOSIS — I10 Essential (primary) hypertension: Secondary | ICD-10-CM | POA: Diagnosis not present

## 2015-09-13 MED ORDER — OMEPRAZOLE 40 MG PO CPDR: DELAYED_RELEASE_CAPSULE | ORAL | Status: DC

## 2015-09-13 MED ORDER — SIMVASTATIN 40 MG PO TABS: ORAL_TABLET | ORAL | Status: DC

## 2015-09-13 MED ORDER — PRAMIPEXOLE DIHYDROCHLORIDE 0.25 MG PO TABS: ORAL_TABLET | ORAL | Status: DC

## 2015-09-13 MED ORDER — ATENOLOL 25 MG PO TABS: 25.0000 mg | ORAL_TABLET | Freq: Every day | ORAL | Status: DC

## 2015-09-13 NOTE — Progress Notes (Signed)
Subjective:    Patient ID: Amanda Park, female    DOB: 17-Jan-1946, 70 y.o.   MRN: [id number]   Patient here today for follow up of chronic medical problems.  Outpatient Encounter Prescriptions as of 09/13/2015  Medication Sig  . aspirin EC 81 MG tablet Take 81 mg by mouth daily.  Marland Kitchen atenolol (TENORMIN) 25 MG tablet Take 1 tablet (25 mg total) by mouth daily.  . cetirizine (ZYRTEC) 10 MG tablet Take 1 tablet (10 mg total) by mouth daily.  Marland Kitchen omeprazole (PRILOSEC) 40 MG capsule TAKE 1 CAPSULE EVERY DAY  . pramipexole (MIRAPEX) 0.25 MG tablet TAKE ONE QHS  . simvastatin (ZOCOR) 40 MG tablet TAKE ONE DAILY        Hypertension This is a chronic problem. The current episode started more than 1 year ago. The problem is controlled. Pertinent negatives include no chest pain, headaches, neck pain, palpitations or shortness of breath. Risk factors for coronary artery disease include post-menopausal state and dyslipidemia. Past treatments include beta blockers. There are no compliance problems.   Hyperlipidemia This is a chronic problem. The current episode started more than 1 year ago. Recent lipid tests were reviewed and are normal. Pertinent negatives include no chest pain or shortness of breath. Current antihyperlipidemic treatment includes statins. The current treatment provides significant improvement of lipids. There are no compliance problems.  Risk factors for coronary artery disease include dyslipidemia, hypertension and post-menopausal.  RLS Mirapex daily helps with symptoms.  GERD Omeprazole keeps symtoms under control.   Review of Systems  Constitutional: Negative.   HENT: Negative.   Eyes: Negative.   Respiratory: Negative.  Negative for shortness of breath.   Cardiovascular: Negative for chest pain and palpitations.  Endocrine: Negative.   Genitourinary: Negative.   Musculoskeletal: Negative.  Negative for neck pain.  Allergic/Immunologic: Negative.   Neurological: Negative.   Negative for headaches.  Hematological: Negative.   Psychiatric/Behavioral: Negative.        Objective:   Physical Exam  Constitutional: She is oriented to person, place, and time. She appears well-developed and well-nourished.  HENT:  Nose: Nose normal.  Mouth/Throat: Oropharynx is clear and moist.  Eyes: EOM are normal.  Neck: Trachea normal, normal range of motion and full passive range of motion without pain. Neck supple. No JVD present. Carotid bruit is not present. No thyromegaly present.  Cardiovascular: Normal rate, regular rhythm, normal heart sounds and intact distal pulses.  Exam reveals no gallop and no friction rub.   No murmur heard. Pulmonary/Chest: Effort normal and breath sounds normal. No respiratory distress.  Abdominal: Soft. Bowel sounds are normal. She exhibits no distension and no mass. There is no tenderness.  Musculoskeletal: Normal range of motion.  Lymphadenopathy:    She has no cervical adenopathy.  Neurological: She is alert and oriented to person, place, and time. She has normal reflexes.  Skin: Skin is warm and dry.  Psychiatric: She has a normal mood and affect. Her behavior is normal. Judgment and thought content normal.   BP 138/74 mmHg  Pulse 58  Temp(Src) 96.9 F (36.1 C) (Oral)  Ht 5' 5"  (1.651 m)  Wt 176 lb (79.833 kg)  BMI 29.29 kg/m2         Assessment & Plan:   1. Essential hypertension, benign Do not add salt todiet - atenolol (TENORMIN) 25 MG tablet; Take 1 tablet (25 mg total) by mouth daily.  Dispense: 90 tablet; Refill: 1 - CMP14+EGFR  2. Gastroesophageal reflux disease without esophagitis Avoid  spicy foods Do not eat 2 hours prior to bedtime - omeprazole (PRILOSEC) 40 MG capsule; TAKE 1 CAPSULE EVERY DAY  Dispense: 90 capsule; Refill: 1  3. Hyperlipidemia with target LDL less than 100 Low fat diet - simvastatin (ZOCOR) 40 MG tablet; TAKE ONE DAILY  Dispense: 90 tablet; Refill: 1 - Lipid panel  4. RLS (restless legs  syndrome) Keep legs warm at night - pramipexole (MIRAPEX) 0.25 MG tablet; TAKE ONE QHS  Dispense: 90 tablet; Refill: 1  5. BMI 29.0-29.9,adult Discussed diet and exercise for person with BMI >25 Will recheck weight in 3-6 months    Patient to schedule colonoscopy Labs pending Health maintenance reviewed Diet and exercise encouraged Continue all meds Follow up  In 6 months   [place], FNP

## 2015-09-13 NOTE — Patient Instructions (Signed)
Restless Legs Syndrome Restless legs syndrome is a condition that causes uncomfortable feelings or sensations in the legs, especially while sitting or lying down. The sensations usually cause an overwhelming urge to move the legs. The arms can also sometimes be affected. The condition can range from mild to severe. The symptoms often interfere with a person's ability to sleep. CAUSES The cause of this condition is not known. RISK FACTORS This condition is more likely to develop in:  People who are older than age 50.  Pregnant women. In general, restless legs syndrome is more common in women than in men.  People who have a family history of the condition.  People who have certain medical conditions, such as iron deficiency, kidney disease, Parkinson disease, or nerve damage.  People who take certain medicines, such as medicines for high blood pressure, nausea, colds, allergies, depression, and some heart conditions. SYMPTOMS The main symptom of this condition is uncomfortable sensations in the legs. These sensations may be:  Described as pulling, tingling, prickling, throbbing, crawling, or burning.  Worse while you are sitting or lying down.  Worse during periods of rest or inactivity.  Worse at night, often interfering with your sleep.  Accompanied by a very strong urge to move your legs.  Temporarily relieved by movement of your legs. The sensations usually affect both sides of the body. The arms can also be affected, but this is rare. People who have this condition often have tiredness during the day because of their lack of sleep at night. DIAGNOSIS This condition may be diagnosed based on your description of the symptoms. You may also have tests, including blood tests, to check for other conditions that may lead to your symptoms. In some cases, you may be asked to spend some time in a sleep lab so your sleeping can be monitored. TREATMENT Treatment for this condition is  focused on managing the symptoms. Treatment may include:  Self-help and lifestyle changes.  Medicines. HOME CARE INSTRUCTIONS  Take medicines only as directed by your health care provider.  Try these methods to get temporary relief from the uncomfortable sensations:  Massage your legs.  Walk or stretch.  Take a cold or hot bath.  Practice good sleep habits. For example, go to bed and get up at the same time every day.  Exercise regularly.  Practice ways of relaxing, such as yoga or meditation.  Avoid caffeine and alcohol.  Do not use any tobacco products, including cigarettes, chewing tobacco, or electronic cigarettes. If you need help quitting, ask your health care provider.  Keep all follow-up visits as directed by your health care provider. This is important. SEEK MEDICAL CARE IF: Your symptoms do not improve with treatment, or they get worse.   This information is not intended to replace advice given to you by your health care provider. Make sure you discuss any questions you have with your health care provider.   Document Released: 07/03/2002 Document Revised: 11/27/2014 Document Reviewed: 07/09/2014 Elsevier Interactive Patient Education 2016 Elsevier Inc.  

## 2015-09-14 LAB — CMP14+EGFR
ALT: 26 IU/L (ref 0–32)
AST: 19 IU/L (ref 0–40)
Albumin/Globulin Ratio: 2 (ref 1.1–2.5)
Albumin: 4.3 g/dL (ref 3.5–4.8)
Alkaline Phosphatase: 64 IU/L (ref 39–117)
BUN / CREAT RATIO: 19 (ref 11–26)
BUN: 19 mg/dL (ref 8–27)
Bilirubin Total: 0.5 mg/dL (ref 0.0–1.2)
CO2: 21 mmol/L (ref 18–29)
CREATININE: 1.02 mg/dL — AB (ref 0.57–1.00)
Calcium: 9.2 mg/dL (ref 8.7–10.3)
Chloride: 106 mmol/L (ref 96–106)
GFR calc Af Amer: 64 mL/min/{1.73_m2} (ref >59)
GFR, EST NON AFRICAN AMERICAN: 56 mL/min/{1.73_m2} — AB (ref >59)
GLOBULIN, TOTAL: 2.2 g/dL (ref 1.5–4.5)
Glucose: 99 mg/dL (ref 65–99)
Potassium: 4.3 mmol/L (ref 3.5–5.2)
SODIUM: 143 mmol/L (ref 134–144)
Total Protein: 6.5 g/dL (ref 6.0–8.5)

## 2015-09-14 LAB — LIPID PANEL
CHOL/HDL RATIO: 3.7 ratio (ref 0.0–4.4)
Cholesterol, Total: 176 mg/dL (ref 100–199)
HDL: 47 mg/dL (ref >39)
LDL CALC: 103 mg/dL — AB (ref 0–99)
Triglycerides: 131 mg/dL (ref 0–149)
VLDL CHOLESTEROL CAL: 26 mg/dL (ref 5–40)

## 2015-09-16 ENCOUNTER — Telehealth: Payer: Self-pay | Admitting: Nurse Practitioner

## 2015-09-16 DIAGNOSIS — Z1211 Encounter for screening for malignant neoplasm of colon: Secondary | ICD-10-CM

## 2015-09-16 NOTE — Telephone Encounter (Signed)
Referral order placed and pt aware.

## 2015-09-16 NOTE — Progress Notes (Signed)
Patient aware.

## 2015-09-17 ENCOUNTER — Telehealth: Payer: Self-pay | Admitting: Nurse Practitioner

## 2015-09-17 ENCOUNTER — Encounter: Payer: Self-pay | Admitting: Gastroenterology

## 2015-09-17 NOTE — Telephone Encounter (Signed)
Patient aware that referral has been made and to contact us with any questions or concerns

## 2015-10-31 ENCOUNTER — Ambulatory Visit (AMBULATORY_SURGERY_CENTER): Payer: Self-pay

## 2015-10-31 VITALS — Ht 65.0 in | Wt 174.4 lb

## 2015-10-31 DIAGNOSIS — Z1211 Encounter for screening for malignant neoplasm of colon: Secondary | ICD-10-CM

## 2015-10-31 MED ORDER — SUPREP BOWEL PREP KIT 17.5-3.13-1.6 GM/177ML PO SOLN: 1.0000 | Freq: Once | ORAL | Status: DC

## 2015-10-31 NOTE — Progress Notes (Signed)
No home oxygen No allergies to eggs or soy No diet/weight loss meds No past problems with anesthesia  Has email and internet; refused emmi

## 2015-11-15 ENCOUNTER — Ambulatory Visit (AMBULATORY_SURGERY_CENTER): Payer: Commercial Managed Care - HMO | Admitting: Gastroenterology

## 2015-11-15 ENCOUNTER — Encounter: Payer: Self-pay | Admitting: Gastroenterology

## 2015-11-15 VITALS — BP 129/59 | HR 68 | Temp 97.5°F | Resp 15 | Ht 65.0 in | Wt 174.0 lb

## 2015-11-15 DIAGNOSIS — D123 Benign neoplasm of transverse colon: Secondary | ICD-10-CM | POA: Diagnosis not present

## 2015-11-15 DIAGNOSIS — Z1211 Encounter for screening for malignant neoplasm of colon: Secondary | ICD-10-CM | POA: Diagnosis not present

## 2015-11-15 DIAGNOSIS — I10 Essential (primary) hypertension: Secondary | ICD-10-CM | POA: Diagnosis not present

## 2015-11-15 DIAGNOSIS — E669 Obesity, unspecified: Secondary | ICD-10-CM | POA: Diagnosis not present

## 2015-11-15 DIAGNOSIS — K219 Gastro-esophageal reflux disease without esophagitis: Secondary | ICD-10-CM | POA: Diagnosis not present

## 2015-11-15 MED ORDER — SODIUM CHLORIDE 0.9 % IV SOLN: 500.0000 mL | INTRAVENOUS | Status: DC

## 2015-11-15 NOTE — Progress Notes (Signed)
Report to PACU, RN, vss, BBS= Clear.  

## 2015-11-15 NOTE — Progress Notes (Signed)
Called to room to assist during endoscopic procedure.  Patient ID and intended procedure confirmed with present staff. Received instructions for my participation in the procedure from the performing physician.  

## 2015-11-15 NOTE — Patient Instructions (Signed)
Discharge instructions given. Handout on polyps. Resume previous medications. YOU HAD AN ENDOSCOPIC PROCEDURE TODAY AT THE [facility]:   Refer to the procedure report that was given to you for any specific questions about what was found during the examination.  If the procedure report does not answer your questions, please call your gastroenterologist to clarify.  If you requested that your care partner not be given the details of your procedure findings, then the procedure report has been included in a sealed envelope for you to review at your convenience later.  YOU SHOULD EXPECT: Some feelings of bloating in the abdomen. Passage of more gas than usual.  Walking can help get rid of the air that was put into your GI tract during the procedure and reduce the bloating. If you had a lower endoscopy (such as a colonoscopy or flexible sigmoidoscopy) you may notice spotting of blood in your stool or on the toilet paper. If you underwent a bowel prep for your procedure, you may not have a normal bowel movement for a few days.  Please Note:  You might notice some irritation and congestion in your nose or some drainage.  This is from the oxygen used during your procedure.  There is no need for concern and it should clear up in a day or so.  SYMPTOMS TO REPORT IMMEDIATELY:   Following lower endoscopy (colonoscopy or flexible sigmoidoscopy):  Excessive amounts of blood in the stool  Significant tenderness or worsening of abdominal pains  Swelling of the abdomen that is new, acute  Fever of 100F or higher   For urgent or emergent issues, a gastroenterologist can be reached at any hour by calling [phone].   DIET: Your first meal following the procedure should be a small meal and then it is ok to progress to your normal diet. Heavy or fried foods are harder to digest and may make you feel nauseous or bloated.  Likewise, meals heavy in dairy and vegetables can increase bloating.  Drink  plenty of fluids but you should avoid alcoholic beverages for 24 hours.  ACTIVITY:  You should plan to take it easy for the rest of today and you should NOT DRIVE or use heavy machinery until tomorrow (because of the sedation medicines used during the test).    FOLLOW UP: Our staff will call the number listed on your records the next business day following your procedure to check on you and address any questions or concerns that you may have regarding the information given to you following your procedure. If we do not reach you, we will leave a message.  However, if you are feeling well and you are not experiencing any problems, there is no need to return our call.  We will assume that you have returned to your regular daily activities without incident.  If any biopsies were taken you will be contacted by phone or by letter within the next 1-3 weeks.  Please call us at [phone] if you have not heard about the biopsies in 3 weeks.    SIGNATURES/CONFIDENTIALITY: You and/or your care partner have signed paperwork which will be entered into your electronic medical record.  These signatures attest to the fact that that the information above on your After Visit Summary has been reviewed and is understood.  Full responsibility of the confidentiality of this discharge information lies with you and/or your care-partner. 

## 2015-11-15 NOTE — Op Note (Signed)
[facility] Patient Name: Amanda Park Procedure Date: 11/15/2015 11:02 AM MRN: [id number] Endoscopist: Milus Banister , MD Age: 70 Date of Birth: Dec 15, 1945 Gender: Female Procedure:                Colonoscopy Indications:              Screening for colorectal malignant neoplasm Medicines:                Monitored Anesthesia Care Procedure:                Pre-Anesthesia Assessment:                           - Prior to the procedure, a History and Physical                            was performed, and patient medications and                            allergies were reviewed. The patient's tolerance of                            previous anesthesia was also reviewed. The risks                            and benefits of the procedure and the sedation                            options and risks were discussed with the patient.                            All questions were answered, and informed consent                            was obtained. Prior Anticoagulants: The patient has                            taken no previous anticoagulant or antiplatelet                            agents. ASA Grade Assessment: II - A patient with                            mild systemic disease. After reviewing the risks                            and benefits, the patient was deemed in                            satisfactory condition to undergo the procedure.                           After obtaining informed consent, the colonoscope  was passed under direct vision. Throughout the                            procedure, the patient's blood pressure, pulse, and                            oxygen saturations were monitored continuously. The                            Model CF-HQ190L [id number]) scope was introduced                            through the anus and advanced to the the cecum,                            identified by appendiceal orifice and ileocecal                        valve. The colonoscopy was performed without                            difficulty. The patient tolerated the procedure                            well. The quality of the bowel preparation was                            excellent. The ileocecal valve, appendiceal                            orifice, and rectum were photographed. Scope In: 11:05:40 AM Scope Out: 11:14:39 AM Scope Withdrawal Time: 0 hours 7 minutes 5 seconds  Total Procedure Duration: 0 hours 8 minutes 59 seconds  Findings:                 A 4 mm polyp was found in the transverse colon. The                            polyp was sessile. The polyp was removed with a                            cold snare. Resection and retrieval were complete.                           The exam was otherwise without abnormality on                            direct and retroflexion views. Complications:            No immediate complications. Estimated blood loss:                            None. Estimated Blood Loss:     Estimated blood loss: none. Impression:               -  One 4 mm polyp in the transverse colon, removed                            with a cold snare. Resected and retrieved.                           - The examination was otherwise normal on direct                            and retroflexion views. Recommendation:           - Patient has a contact number available for                            emergencies. The signs and symptoms of potential                            delayed complications were discussed with the                            patient. Return to normal activities tomorrow.                            Written discharge instructions were provided to the                            patient.                           - Resume previous diet.                           - Continue present medications.                           You will receive a letter within 2-3 weeks with the                             pathology results and my final recommendations.                           If the polyp(s) is proven to be 'pre-cancerous' on                            pathology, you will need repeat colonoscopy in 5                            years. Milus Banister, MD 11/15/2015 11:17:17 AM This report has been signed electronically.

## 2015-11-18 ENCOUNTER — Telehealth: Payer: Self-pay

## 2015-11-18 NOTE — Telephone Encounter (Signed)
  Follow up Call-  Call back number 11/15/2015  Post procedure Call Back phone  # [phone]  Permission to leave phone message Yes     Patient questions:  Do you have a fever, pain , or abdominal swelling? No. Pain Score  0 *  Have you tolerated food without any problems? Yes.    Have you been able to return to your normal activities? Yes.    Do you have any questions about your discharge instructions: Diet   No. Medications  No. Follow up visit  No.  Do you have questions or concerns about your Care? No.  Actions: * If pain score is 4 or above: No action needed, pain <4.

## 2015-11-21 ENCOUNTER — Encounter: Payer: Self-pay | Admitting: Gastroenterology

## 2015-12-06 ENCOUNTER — Encounter: Payer: Self-pay | Admitting: Family Medicine

## 2015-12-06 ENCOUNTER — Ambulatory Visit (INDEPENDENT_AMBULATORY_CARE_PROVIDER_SITE_OTHER): Payer: Commercial Managed Care - HMO | Admitting: Family Medicine

## 2015-12-06 VITALS — BP 102/64 | HR 86 | Temp 98.5°F | Ht 65.0 in | Wt 174.2 lb

## 2015-12-06 DIAGNOSIS — R300 Dysuria: Secondary | ICD-10-CM

## 2015-12-06 DIAGNOSIS — N3000 Acute cystitis without hematuria: Secondary | ICD-10-CM | POA: Diagnosis not present

## 2015-12-06 LAB — URINALYSIS
Bilirubin, UA: NEGATIVE
Glucose, UA: NEGATIVE
KETONES UA: NEGATIVE
NITRITE UA: NEGATIVE
Protein, UA: NEGATIVE
RBC, UA: NEGATIVE
Specific Gravity, UA: >1.03 — ABNORMAL HIGH (ref 1.005–1.030)
UUROB: 0.2 mg/dL (ref 0.2–1.0)
pH, UA: 5.5 (ref 5.0–7.5)

## 2015-12-06 MED ORDER — SULFAMETHOXAZOLE-TRIMETHOPRIM 800-160 MG PO TABS: 1.0000 | ORAL_TABLET | Freq: Two times a day (BID) | ORAL | Status: DC

## 2015-12-06 NOTE — Progress Notes (Signed)
BP 102/64 mmHg  Pulse 86  Temp(Src) 98.5 F (36.9 C) (Oral)  Ht 5\' 5"  (1.651 m)  Wt 174 lb 3.2 oz (79.017 kg)  BMI 28.99 kg/m2   Subjective:    Patient ID: Amanda Park, female    DOB: May 06, 1946, 70 y.o.   MRN: [id number]  HPI: Amanda Park is a 70 y.o. female presenting on 12/06/2015 for Urinary Tract Infection   HPI Dysuria and low back pain. Patient has been having dysuria and low back pain for the past couple days. She denies any fevers or chills. She denies any hematuria. She has had this similarly before when she's had a previous UTI but has been quite a few years since she had it. She said she did just get a colonoscopy and of the prep for it and since then she's been having these symptoms. She has been having some suprapubic pressure and urinary frequency as well.  Relevant past medical, surgical, family and social history reviewed and updated as indicated. Interim medical history since our last visit reviewed. Allergies and medications reviewed and updated.  Review of Systems  Constitutional: Negative for fever and chills.  HENT: Negative for congestion, ear discharge and ear pain.   Eyes: Negative for redness and visual disturbance.  Respiratory: Negative for chest tightness and shortness of breath.   Cardiovascular: Negative for chest pain and leg swelling.  Genitourinary: Positive for dysuria, frequency and flank pain. Negative for urgency, hematuria, decreased urine volume, vaginal bleeding, vaginal discharge, difficulty urinating and vaginal pain.  Musculoskeletal: Negative for back pain and gait problem.  Skin: Negative for rash.  Neurological: Negative for light-headedness and headaches.  Psychiatric/Behavioral: Negative for behavioral problems and agitation.  All other systems reviewed and are negative.   Per HPI unless specifically indicated above     Medication List       This list is accurate as of: 12/06/15  5:51 PM.  Always use your most recent med  list.               aspirin EC 81 MG tablet  Take 81 mg by mouth daily.     atenolol 25 MG tablet  Commonly known as:  TENORMIN  Take 1 tablet (25 mg total) by mouth daily.     cetirizine 10 MG tablet  Commonly known as:  ZYRTEC  Take 1 tablet (10 mg total) by mouth daily.     omeprazole 40 MG capsule  Commonly known as:  PRILOSEC  TAKE 1 CAPSULE EVERY DAY     pramipexole 0.25 MG tablet  Commonly known as:  MIRAPEX  TAKE ONE QHS     simvastatin 40 MG tablet  Commonly known as:  ZOCOR  TAKE ONE DAILY     sulfamethoxazole-trimethoprim 800-160 MG tablet  Commonly known as:  BACTRIM DS,SEPTRA DS  Take 1 tablet by mouth 2 (two) times daily.           Objective:    BP 102/64 mmHg  Pulse 86  Temp(Src) 98.5 F (36.9 C) (Oral)  Ht 5\' 5"  (1.651 m)  Wt 174 lb 3.2 oz (79.017 kg)  BMI 28.99 kg/m2  Wt Readings from Last 3 Encounters:  12/06/15 174 lb 3.2 oz (79.017 kg)  11/15/15 174 lb (78.926 kg)  10/31/15 174 lb 6.4 oz (79.107 kg)    Physical Exam  Constitutional: She is oriented to person, place, and time. She appears well-developed and well-nourished. No distress.  Eyes: Conjunctivae and EOM are normal. Pupils are equal,  round, and reactive to light.  Cardiovascular: Normal rate, regular rhythm, normal heart sounds and intact distal pulses.   No murmur heard. Pulmonary/Chest: Effort normal and breath sounds normal. No respiratory distress. She has no wheezes.  Abdominal: Soft. Bowel sounds are normal. She exhibits no distension. There is no hepatosplenomegaly. There is tenderness (Mild suprapubic tenderness) in the suprapubic area. There is no rebound, no guarding and no CVA tenderness.  Musculoskeletal: Normal range of motion. She exhibits no edema or tenderness.  Neurological: She is alert and oriented to person, place, and time. Coordination normal.  Skin: Skin is warm and dry. No rash noted. She is not diaphoretic.  Psychiatric: She has a normal mood and affect.  Her behavior is normal.  Nursing note and vitals reviewed.   UA, dip only showed leukocytes and trace amounts and a specific gravity greater than 1.030 but otherwise normal.    Assessment & Plan:       Problem List Items Addressed This Visit    None    Visit Diagnoses    Dysuria    -  Primary    Relevant Medications    sulfamethoxazole-trimethoprim (BACTRIM DS,SEPTRA DS) 800-160 MG tablet    Other Relevant Orders    Urinalysis    Acute cystitis without hematuria        Relevant Medications    sulfamethoxazole-trimethoprim (BACTRIM DS,SEPTRA DS) 800-160 MG tablet        Follow up plan: Return if symptoms worsen or fail to improve.  Counseling provided for all of the vaccine components Orders Placed This Encounter  Procedures  . Urinalysis    Caryl Pina, MD [facility] 12/06/2015, 5:51 PM

## 2015-12-07 LAB — URINE CULTURE

## 2016-03-27 ENCOUNTER — Telehealth: Payer: Self-pay | Admitting: Nurse Practitioner

## 2016-03-27 ENCOUNTER — Ambulatory Visit (INDEPENDENT_AMBULATORY_CARE_PROVIDER_SITE_OTHER): Payer: Commercial Managed Care - HMO | Admitting: Nurse Practitioner

## 2016-03-27 ENCOUNTER — Encounter: Payer: Self-pay | Admitting: Nurse Practitioner

## 2016-03-27 VITALS — BP 133/69 | HR 63 | Temp 96.8°F | Ht 65.0 in | Wt 173.0 lb

## 2016-03-27 DIAGNOSIS — I10 Essential (primary) hypertension: Secondary | ICD-10-CM | POA: Diagnosis not present

## 2016-03-27 DIAGNOSIS — Z1159 Encounter for screening for other viral diseases: Secondary | ICD-10-CM | POA: Diagnosis not present

## 2016-03-27 DIAGNOSIS — J302 Other seasonal allergic rhinitis: Secondary | ICD-10-CM

## 2016-03-27 DIAGNOSIS — Z0000 Encounter for general adult medical examination without abnormal findings: Secondary | ICD-10-CM

## 2016-03-27 DIAGNOSIS — Z6829 Body mass index (BMI) 29.0-29.9, adult: Secondary | ICD-10-CM

## 2016-03-27 DIAGNOSIS — G2581 Restless legs syndrome: Secondary | ICD-10-CM

## 2016-03-27 DIAGNOSIS — K219 Gastro-esophageal reflux disease without esophagitis: Secondary | ICD-10-CM

## 2016-03-27 DIAGNOSIS — E785 Hyperlipidemia, unspecified: Secondary | ICD-10-CM

## 2016-03-27 LAB — URINALYSIS, COMPLETE
BILIRUBIN UA: NEGATIVE
Glucose, UA: NEGATIVE
KETONES UA: NEGATIVE
NITRITE UA: NEGATIVE
Protein, UA: NEGATIVE
RBC UA: NEGATIVE
Urobilinogen, Ur: 0.2 mg/dL (ref 0.2–1.0)
pH, UA: 5 (ref 5.0–7.5)

## 2016-03-27 LAB — MICROSCOPIC EXAMINATION
BACTERIA UA: NONE SEEN
RBC, UA: NONE SEEN /hpf (ref 0–?)

## 2016-03-27 MED ORDER — ATENOLOL 25 MG PO TABS: 25.0000 mg | ORAL_TABLET | Freq: Every day | ORAL | 1 refills | Status: DC

## 2016-03-27 MED ORDER — SIMVASTATIN 40 MG PO TABS: ORAL_TABLET | ORAL | 1 refills | Status: DC

## 2016-03-27 MED ORDER — OMEPRAZOLE 40 MG PO CPDR: DELAYED_RELEASE_CAPSULE | ORAL | 1 refills | Status: DC

## 2016-03-27 MED ORDER — PRAMIPEXOLE DIHYDROCHLORIDE 0.25 MG PO TABS: ORAL_TABLET | ORAL | 1 refills | Status: DC

## 2016-03-27 NOTE — Patient Instructions (Signed)
Health Maintenance, Female Adopting a healthy lifestyle and getting preventive care can go a long way to promote health and wellness. Talk with your health care provider about what schedule of regular examinations is right for you. This is a good chance for you to check in with your provider about disease prevention and staying healthy. In between checkups, there are plenty of things you can do on your own. Experts have done a lot of research about which lifestyle changes and preventive measures are most likely to keep you healthy. Ask your health care provider for more information. WEIGHT AND DIET  Eat a healthy diet  Be sure to include plenty of vegetables, fruits, low-fat dairy products, and lean protein.  Do not eat a lot of foods high in solid fats, added sugars, or salt.  Get regular exercise. This is one of the most important things you can do for your health.  Most adults should exercise for at least 150 minutes each week. The exercise should increase your heart rate and make you sweat (moderate-intensity exercise).  Most adults should also do strengthening exercises at least twice a week. This is in addition to the moderate-intensity exercise.  Maintain a healthy weight  Body mass index (BMI) is a measurement that can be used to identify possible weight problems. It estimates body fat based on height and weight. Your health care provider can help determine your BMI and help you achieve or maintain a healthy weight.  For females 20 years of age and older:   A BMI below 18.5 is considered underweight.  A BMI of 18.5 to 24.9 is normal.  A BMI of 25 to 29.9 is considered overweight.  A BMI of 30 and above is considered obese.  Watch levels of cholesterol and blood lipids  You should start having your blood tested for lipids and cholesterol at 71 years of age, then have this test every 5 years.  You may need to have your cholesterol levels checked more often if:  Your lipid  or cholesterol levels are high.  You are older than 71 years of age.  You are at high risk for heart disease.  CANCER SCREENING   Lung Cancer  Lung cancer screening is recommended for adults 55-80 years old who are at high risk for lung cancer because of a history of smoking.  A yearly low-dose CT scan of the lungs is recommended for people who:  Currently smoke.  Have quit within the past 15 years.  Have at least a 30-pack-year history of smoking. A pack year is smoking an average of one pack of cigarettes a day for 1 year.  Yearly screening should continue until it has been 15 years since you quit.  Yearly screening should stop if you develop a health problem that would prevent you from having lung cancer treatment.  Breast Cancer  Practice breast self-awareness. This means understanding how your breasts normally appear and feel.  It also means doing regular breast self-exams. Let your health care provider know about any changes, no matter how small.  If you are in your 20s or 30s, you should have a clinical breast exam (CBE) by a health care provider every 1-3 years as part of a regular health exam.  If you are 40 or older, have a CBE every year. Also consider having a breast X-ray (mammogram) every year.  If you have a family history of breast cancer, talk to your health care provider about genetic screening.  If you   are at high risk for breast cancer, talk to your health care provider about having an MRI and a mammogram every year.  Breast cancer gene (BRCA) assessment is recommended for women who have family members with BRCA-related cancers. BRCA-related cancers include:  Breast.  Ovarian.  Tubal.  Peritoneal cancers.  Results of the assessment will determine the need for genetic counseling and BRCA1 and BRCA2 testing. Cervical Cancer Your health care provider may recommend that you be screened regularly for cancer of the pelvic organs (ovaries, uterus, and  vagina). This screening involves a pelvic examination, including checking for microscopic changes to the surface of your cervix (Pap test). You may be encouraged to have this screening done every 3 years, beginning at age 21.  For women ages 30-65, health care providers may recommend pelvic exams and Pap testing every 3 years, or they may recommend the Pap and pelvic exam, combined with testing for human papilloma virus (HPV), every 5 years. Some types of HPV increase your risk of cervical cancer. Testing for HPV may also be done on women of any age with unclear Pap test results.  Other health care providers may not recommend any screening for nonpregnant women who are considered low risk for pelvic cancer and who do not have symptoms. Ask your health care provider if a screening pelvic exam is right for you.  If you have had past treatment for cervical cancer or a condition that could lead to cancer, you need Pap tests and screening for cancer for at least 20 years after your treatment. If Pap tests have been discontinued, your risk factors (such as having a new sexual partner) need to be reassessed to determine if screening should resume. Some women have medical problems that increase the chance of getting cervical cancer. In these cases, your health care provider may recommend more frequent screening and Pap tests. Colorectal Cancer  This type of cancer can be detected and often prevented.  Routine colorectal cancer screening usually begins at 71 years of age and continues through 71 years of age.  Your health care provider may recommend screening at an earlier age if you have risk factors for colon cancer.  Your health care provider may also recommend using home test kits to check for hidden blood in the stool.  A small camera at the end of a tube can be used to examine your colon directly (sigmoidoscopy or colonoscopy). This is done to check for the earliest forms of colorectal  cancer.  Routine screening usually begins at age 50.  Direct examination of the colon should be repeated every 5-10 years through 71 years of age. However, you may need to be screened more often if early forms of precancerous polyps or small growths are found. Skin Cancer  Check your skin from head to toe regularly.  Tell your health care provider about any new moles or changes in moles, especially if there is a change in a mole's shape or color.  Also tell your health care provider if you have a mole that is larger than the size of a pencil eraser.  Always use sunscreen. Apply sunscreen liberally and repeatedly throughout the day.  Protect yourself by wearing long sleeves, pants, a wide-brimmed hat, and sunglasses whenever you are outside. HEART DISEASE, DIABETES, AND HIGH BLOOD PRESSURE   High blood pressure causes heart disease and increases the risk of stroke. High blood pressure is more likely to develop in:  People who have blood pressure in the high end   of the normal range (130-139/85-89 mm Hg).  People who are overweight or obese.  People who are African American.  If you are 38-23 years of age, have your blood pressure checked every 3-5 years. If you are 61 years of age or older, have your blood pressure checked every year. You should have your blood pressure measured twice--once when you are at a hospital or clinic, and once when you are not at a hospital or clinic. Record the average of the two measurements. To check your blood pressure when you are not at a hospital or clinic, you can use:  An automated blood pressure machine at a pharmacy.  A home blood pressure monitor.  If you are between 45 years and 39 years old, ask your health care provider if you should take aspirin to prevent strokes.  Have regular diabetes screenings. This involves taking a blood sample to check your fasting blood sugar level.  If you are at a normal weight and have a low risk for diabetes,  have this test once every three years after 71 years of age.  If you are overweight and have a high risk for diabetes, consider being tested at a younger age or more often. PREVENTING INFECTION  Hepatitis B  If you have a higher risk for hepatitis B, you should be screened for this virus. You are considered at high risk for hepatitis B if:  You were born in a country where hepatitis B is common. Ask your health care provider which countries are considered high risk.  Your parents were born in a high-risk country, and you have not been immunized against hepatitis B (hepatitis B vaccine).  You have HIV or AIDS.  You use needles to inject street drugs.  You live with someone who has hepatitis B.  You have had sex with someone who has hepatitis B.  You get hemodialysis treatment.  You take certain medicines for conditions, including cancer, organ transplantation, and autoimmune conditions. Hepatitis C  Blood testing is recommended for:  Everyone born from [birth year] through 1966.  Anyone with known risk factors for hepatitis C. Sexually transmitted infections (STIs)  You should be screened for sexually transmitted infections (STIs) including gonorrhea and chlamydia if:  You are sexually active and are younger than 71 years of age.  You are older than 71 years of age and your health care provider tells you that you are at risk for this type of infection.  Your sexual activity has changed since you were last screened and you are at an increased risk for chlamydia or gonorrhea. Ask your health care provider if you are at risk.  If you do not have HIV, but are at risk, it may be recommended that you take a prescription medicine daily to prevent HIV infection. This is called pre-exposure prophylaxis (PrEP). You are considered at risk if:  You are sexually active and do not regularly use condoms or know the HIV status of your partner(s).  You take drugs by injection.  You are sexually  active with a partner who has HIV. Talk with your health care provider about whether you are at high risk of being infected with HIV. If you choose to begin PrEP, you should first be tested for HIV. You should then be tested every 3 months for as long as you are taking PrEP.  PREGNANCY   If you are premenopausal and you may become pregnant, ask your health care provider about preconception counseling.  If you may  become pregnant, take 400 to 800 micrograms (mcg) of folic acid every day.  If you want to prevent pregnancy, talk to your health care provider about birth control (contraception). OSTEOPOROSIS AND MENOPAUSE   Osteoporosis is a disease in which the bones lose minerals and strength with aging. This can result in serious bone fractures. Your risk for osteoporosis can be identified using a bone density scan.  If you are 61 years of age or older, or if you are at risk for osteoporosis and fractures, ask your health care provider if you should be screened.  Ask your health care provider whether you should take a calcium or vitamin D supplement to lower your risk for osteoporosis.  Menopause may have certain physical symptoms and risks.  Hormone replacement therapy may reduce some of these symptoms and risks. Talk to your health care provider about whether hormone replacement therapy is right for you.  HOME CARE INSTRUCTIONS   Schedule regular health, dental, and eye exams.  Stay current with your immunizations.   Do not use any tobacco products including cigarettes, chewing tobacco, or electronic cigarettes.  If you are pregnant, do not drink alcohol.  If you are breastfeeding, limit how much and how often you drink alcohol.  Limit alcohol intake to no more than 1 drink per day for nonpregnant women. One drink equals 12 ounces of beer, 5 ounces of wine, or 1 ounces of hard liquor.  Do not use street drugs.  Do not share needles.  Ask your health care provider for help if  you need support or information about quitting drugs.  Tell your health care provider if you often feel depressed.  Tell your health care provider if you have ever been abused or do not feel safe at home.   This information is not intended to replace advice given to you by your health care provider. Make sure you discuss any questions you have with your health care provider.   Document Released: 01/26/2011 Document Revised: 08/03/2014 Document Reviewed: 06/14/2013 Elsevier Interactive Patient Education [employer].

## 2016-03-27 NOTE — Progress Notes (Signed)
Subjective:    Patient ID: Amanda Park, female    DOB: August 04, 1945, 70 y.o.   MRN: [id number]   Patient here today for annual physical exam and follow up of chronic medical problems. No changes since last visit.   Outpatient Encounter Prescriptions as of 09/13/2015  Medication Sig  . aspirin EC 81 MG tablet Take 81 mg by mouth daily.  Marland Kitchen atenolol (TENORMIN) 25 MG tablet Take 1 tablet (25 mg total) by mouth daily.  . cetirizine (ZYRTEC) 10 MG tablet Take 1 tablet (10 mg total) by mouth daily.  Marland Kitchen omeprazole (PRILOSEC) 40 MG capsule TAKE 1 CAPSULE EVERY DAY  . pramipexole (MIRAPEX) 0.25 MG tablet TAKE ONE QHS  . simvastatin (ZOCOR) 40 MG tablet TAKE ONE DAILY        Hypertension  This is a chronic problem. The current episode started more than 1 year ago. The problem is controlled. Pertinent negatives include no chest pain, headaches, neck pain, palpitations or shortness of breath. Risk factors for coronary artery disease include post-menopausal state and dyslipidemia. Past treatments include beta blockers. There are no compliance problems.   Hyperlipidemia  This is a chronic problem. The current episode started more than 1 year ago. Recent lipid tests were reviewed and are normal. Pertinent negatives include no chest pain or shortness of breath. Current antihyperlipidemic treatment includes statins. The current treatment provides significant improvement of lipids. There are no compliance problems.  Risk factors for coronary artery disease include dyslipidemia, hypertension and post-menopausal.  RLS Mirapex daily helps with symptoms.  GERD Omeprazole keeps symtoms under control.    * Had colonoscopy in April 2017 and had some benign polyps found. Wants repeated in 5 years.  Review of Systems  Constitutional: Negative.   HENT: Negative.   Eyes: Negative.   Respiratory: Negative.  Negative for shortness of breath.   Cardiovascular: Negative for chest pain and palpitations.  Endocrine:  Negative.   Genitourinary: Negative.   Musculoskeletal: Negative.  Negative for neck pain.  Allergic/Immunologic: Negative.   Neurological: Negative.  Negative for headaches.  Hematological: Negative.   Psychiatric/Behavioral: Negative.        Objective:   Physical Exam  Constitutional: She is oriented to person, place, and time. She appears well-developed and well-nourished.  HENT:  Nose: Nose normal.  Mouth/Throat: Oropharynx is clear and moist.  Eyes: EOM are normal.  Neck: Trachea normal, normal range of motion and full passive range of motion without pain. Neck supple. No JVD present. Carotid bruit is not present. No thyromegaly present.  Cardiovascular: Normal rate, regular rhythm, normal heart sounds and intact distal pulses.  Exam reveals no gallop and no friction rub.   No murmur heard. Pulmonary/Chest: Effort normal and breath sounds normal. No respiratory distress.  Abdominal: Soft. Bowel sounds are normal. She exhibits no distension and no mass. There is no tenderness.  Musculoskeletal: Normal range of motion.  Lymphadenopathy:    She has no cervical adenopathy.  Neurological: She is alert and oriented to person, place, and time. She has normal reflexes.  Skin: Skin is warm and dry.  Psychiatric: She has a normal mood and affect. Her behavior is normal. Judgment and thought content normal.   BP 133/69   Pulse 63   Temp (!) 96.8 F (36 C) (Oral)   Ht 5' 5"  (1.651 m)   Wt 173 lb (78.5 kg)   BMI 28.79 kg/m  EKG sinus bradycardia-.mmmms      Assessment & Plan:  1. Annual physical exam -  Urinalysis, Complete  2. Essential hypertension, benign Do not add salt o diet - CMP14+EGFR - atenolol (TENORMIN) 25 MG tablet; Take 1 tablet (25 mg total) by mouth daily.  Dispense: 90 tablet; Refill: 1  3. Other seasonal allergic rhinitis  4. Gastroesophageal reflux disease without esophagitis Avoid spicy foods Do not eat 2 hours prior to bedtime - omeprazole (PRILOSEC)  40 MG capsule; TAKE 1 CAPSULE EVERY DAY  Dispense: 90 capsule; Refill: 1  5. BMI 29.0-29.9,adult Discussed diet and exercise for person with BMI >25 Will recheck weight in 3-6 months  6. Hyperlipidemia with target LDL less than 100 Low fat diet - Lipid panel - simvastatin (ZOCOR) 40 MG tablet; TAKE ONE DAILY  Dispense: 90 tablet; Refill: 1  7. RLS (restless legs syndrome) - pramipexole (MIRAPEX) 0.25 MG tablet; TAKE ONE QHS  Dispense: 90 tablet; Refill: 1    Labs pending Health maintenance reviewed Diet and exercise encouraged Continue all meds Follow up  In 6 months   [place], FNP

## 2016-03-28 LAB — CMP14+EGFR
ALT: 38 IU/L — AB (ref 0–32)
AST: 26 IU/L (ref 0–40)
Albumin/Globulin Ratio: 2.3 — ABNORMAL HIGH (ref 1.2–2.2)
Albumin: 4.9 g/dL — ABNORMAL HIGH (ref 3.5–4.8)
Alkaline Phosphatase: 67 IU/L (ref 39–117)
BUN/Creatinine Ratio: 17 (ref 12–28)
BUN: 19 mg/dL (ref 8–27)
Bilirubin Total: 0.5 mg/dL (ref 0.0–1.2)
CALCIUM: 9.7 mg/dL (ref 8.7–10.3)
CO2: 23 mmol/L (ref 18–29)
CREATININE: 1.09 mg/dL — AB (ref 0.57–1.00)
Chloride: 102 mmol/L (ref 96–106)
GFR calc Af Amer: 59 mL/min/{1.73_m2} — ABNORMAL LOW (ref >59)
GFR, EST NON AFRICAN AMERICAN: 52 mL/min/{1.73_m2} — AB (ref >59)
Globulin, Total: 2.1 g/dL (ref 1.5–4.5)
Glucose: 101 mg/dL — ABNORMAL HIGH (ref 65–99)
Potassium: 4.5 mmol/L (ref 3.5–5.2)
Sodium: 142 mmol/L (ref 134–144)
Total Protein: 7 g/dL (ref 6.0–8.5)

## 2016-03-28 LAB — LIPID PANEL
CHOL/HDL RATIO: 3.6 ratio (ref 0.0–4.4)
Cholesterol, Total: 181 mg/dL (ref 100–199)
HDL: 50 mg/dL (ref >39)
LDL CALC: 99 mg/dL (ref 0–99)
TRIGLYCERIDES: 159 mg/dL — AB (ref 0–149)
VLDL Cholesterol Cal: 32 mg/dL (ref 5–40)

## 2016-03-28 LAB — HEPATITIS C ANTIBODY

## 2016-04-01 ENCOUNTER — Telehealth: Payer: Self-pay | Admitting: Nurse Practitioner

## 2016-04-01 NOTE — Telephone Encounter (Signed)
Patient states that [facility] called and her Atenolol is not in stock and they do not know when they will get it in. Pharmacy would like to know if there is another medication that patient can be on instead? Patient aware you are out of the office today and will be be back tomorrow. Patient was fine with that. Please advise.

## 2016-04-02 MED ORDER — METOPROLOL SUCCINATE ER 25 MG PO TB24: 25.0000 mg | ORAL_TABLET | Freq: Every day | ORAL | 1 refills | Status: DC

## 2016-04-02 NOTE — Telephone Encounter (Signed)
Pt aware of change.

## 2016-04-08 NOTE — Telephone Encounter (Signed)
No appts available through Dec at the mobile [facility]. 2018 schedule not available yet. Letter mailed with this information. Can call to schedule at another facility if she prefers.

## 2016-04-30 ENCOUNTER — Ambulatory Visit (INDEPENDENT_AMBULATORY_CARE_PROVIDER_SITE_OTHER): Payer: Commercial Managed Care - HMO

## 2016-04-30 DIAGNOSIS — Z23 Encounter for immunization: Secondary | ICD-10-CM

## 2016-06-16 ENCOUNTER — Encounter: Payer: Self-pay | Admitting: Pediatrics

## 2016-06-16 ENCOUNTER — Ambulatory Visit (INDEPENDENT_AMBULATORY_CARE_PROVIDER_SITE_OTHER): Payer: Commercial Managed Care - HMO | Admitting: Pediatrics

## 2016-06-16 VITALS — BP 138/66 | HR 65 | Temp 97.0°F | Ht 65.0 in | Wt 177.0 lb

## 2016-06-16 DIAGNOSIS — M7062 Trochanteric bursitis, left hip: Secondary | ICD-10-CM | POA: Diagnosis not present

## 2016-06-16 DIAGNOSIS — M79652 Pain in left thigh: Secondary | ICD-10-CM | POA: Diagnosis not present

## 2016-06-16 NOTE — Patient Instructions (Addendum)

## 2016-06-16 NOTE — Progress Notes (Signed)
  Subjective:   Patient ID: Amanda Park, female    DOB: [birth year], 70 y.o.   MRN: [id number] CC: left thigh pain (outer/ upper thigh pain x 1 month)  HPI: [name] is a 70 y.o. female presenting for left thigh pain (outer/ upper thigh pain x 1 month)  Hurt with certain movements at first Has been getting worse Pain on outside of L hip Walking makes it worse Being active makes it worse Has tried OTC tylenol/ibuprofen Minimal help No injury to hip that she knows of Has not had pain before No groin pain  Relevant past medical, surgical, family and social history reviewed. Allergies and medications reviewed and updated. History  Smoking Status  . Never Smoker  Smokeless Tobacco  . Never Used   ROS: Per HPI   Objective:    BP 138/66   Pulse 65   Temp 97 F (36.1 C) (Oral)   Ht 5\' 5"  (1.651 m)   Wt 177 lb (80.3 kg)   BMI 29.45 kg/m   Wt Readings from Last 3 Encounters:  06/16/16 177 lb (80.3 kg)  03/27/16 173 lb (78.5 kg)  12/06/15 174 lb 3.2 oz (79 kg)    Gen: NAD, alert, cooperative with exam, NCAT EYES: EOMI, no conjunctival injection, or no icterus CV: NRRR, normal S1/S2, no murmur Resp: CTABL, no wheezes, normal WOB Abd: +BS, soft, NTND. no guarding or organomegaly Ext: No edema, warm Neuro: Alert and oriented MSK: normal int/ext ROM of b/l hips, flex/ext at knee equal b/l TTP over L greater [place]  Assessment & Plan:  Anavel was seen today for left thigh pain, no groin pain, no pain with hip ROM.  Diagnoses and all orders for this visit:  Trochanteric bursitis of left hip Discussed options Gave exercises, will do injection If no improvement will refer to PT   Procedure: Trochanteric bursa injection Verbal consent was obtained from the patient. Risks including infection, bleeding explained and contrasted with benefits and alternatives. Patient prepped with alcohol. L Trochanteric bursa was injected. The patient tolerated the procedure well.  No complications. After standing up following the procedure said knee felt much better. Injection: 43mL bupivacaine and  Kenalog 40 mg. Needle: 25 gauge  Follow up plan: Return if symptoms worsen or fail to improve. Assunta Found, MD [place]

## 2016-06-23 DIAGNOSIS — Z1231 Encounter for screening mammogram for malignant neoplasm of breast: Secondary | ICD-10-CM | POA: Diagnosis not present

## 2016-07-27 DIAGNOSIS — K635 Polyp of colon: Secondary | ICD-10-CM

## 2016-07-27 HISTORY — DX: Polyp of colon: K63.5

## 2016-09-15 ENCOUNTER — Ambulatory Visit (INDEPENDENT_AMBULATORY_CARE_PROVIDER_SITE_OTHER): Payer: Medicare HMO | Admitting: Nurse Practitioner

## 2016-09-15 ENCOUNTER — Encounter: Payer: Self-pay | Admitting: Nurse Practitioner

## 2016-09-15 VITALS — BP 122/72 | HR 88 | Temp 97.0°F | Ht 65.0 in | Wt 174.0 lb

## 2016-09-15 DIAGNOSIS — R110 Nausea: Secondary | ICD-10-CM

## 2016-09-15 MED ORDER — ONDANSETRON HCL 4 MG PO TABS: 4.0000 mg | ORAL_TABLET | Freq: Three times a day (TID) | ORAL | 0 refills | Status: DC | PRN

## 2016-09-15 NOTE — Progress Notes (Signed)
   Subjective:    Patient ID: Amanda Park, female    DOB: [birth year], 71 y.o.   MRN: [id number]  HPI Patient comes in today c/o nausea and vomiting that started all of the sudden Saturday morning. SHe was not able to keep anything down over weekend- ate [place] Friday night and felt fine. The vomiting has stopped- the last time she was vomiting was Yesterday evening. SHe is still nauseated- no diarrhea.   Review of Systems  Constitutional: Positive for appetite change. Negative for chills and fever.  HENT: Negative.   Respiratory: Negative.   Cardiovascular: Negative.   Gastrointestinal: Positive for nausea and vomiting (which resolved yesterday). Negative for abdominal distention, abdominal pain and diarrhea.  Genitourinary: Negative.   Neurological: Negative.   Psychiatric/Behavioral: Negative.   All other systems reviewed and are negative.      Objective:   Physical Exam  Constitutional: She appears well-developed and well-nourished. No distress.  Cardiovascular: Normal rate and regular rhythm.   Pulmonary/Chest: Effort normal and breath sounds normal.  Abdominal: Soft. Bowel sounds are normal. She exhibits no distension. There is no tenderness. There is no rebound.  Neurological: She is alert.  Skin: Skin is warm.  Psychiatric: She has a normal mood and affect. Her behavior is normal. Judgment and thought content normal.   BP 122/72   Pulse 88   Temp 97 F (36.1 C) (Oral)   Ht 5\' 5"  (1.651 m)   Wt 174 lb (78.9 kg)   BMI 28.96 kg/m        Assessment & Plan:  1. Nausea First 24 Hours-Clear liquids  popsicles  Jello  gatorade  Sprite Second 24 hours-Add Full liquids ( Liquids you cant see through) Third 24 hours- Bland diet ( foods that are baked or broiled)  *avoiding fried foods and highly spiced foods* During these 3 days  Avoid milk, cheese, ice cream or any other dairy products  Avoid caffeine- REMEMBER Mt. Dew and Mello Yellow contain lots of  caffeine You should eat and drink in  Frequent small volumes If no improvement in symptoms or worsen in 2-3 days should RETRUN TO OFFICE or go to ER!    Meds ordered this encounter  Medications  . ondansetron (ZOFRAN) 4 MG tablet    Sig: Take 1 tablet (4 mg total) by mouth every 8 (eight) hours as needed for nausea or vomiting.    Dispense:  20 tablet    Refill:  0    Order Specific Question:   Supervising Provider    Answer:   Eustaquio Maize [[id number]]   Mary-Margaret Hassell Done, FNP

## 2016-09-15 NOTE — Patient Instructions (Signed)

## 2016-09-21 ENCOUNTER — Telehealth: Payer: Self-pay | Admitting: Nurse Practitioner

## 2016-09-21 DIAGNOSIS — R300 Dysuria: Secondary | ICD-10-CM

## 2016-09-21 NOTE — Telephone Encounter (Signed)
Can she come by and leave a urine sample?

## 2016-09-21 NOTE — Telephone Encounter (Signed)
lmtcb

## 2016-09-21 NOTE — Telephone Encounter (Signed)
Nausea from the 20th is better. Now has urinary urgency, abd pain and dysuria. Taking AZO. Has appt on Friday w/ MMM for regular ckup was wondering if she could get med called in so she did not have to come in twice this week

## 2016-09-21 NOTE — Telephone Encounter (Signed)
Per pt, she is unable to come in today for urine specimen but will come by tomorrow Informed pt we will call sith results

## 2016-09-23 ENCOUNTER — Ambulatory Visit (INDEPENDENT_AMBULATORY_CARE_PROVIDER_SITE_OTHER): Payer: Medicare HMO | Admitting: Pediatrics

## 2016-09-23 ENCOUNTER — Encounter: Payer: Self-pay | Admitting: Pediatrics

## 2016-09-23 VITALS — BP 140/63 | HR 65 | Temp 96.7°F | Ht 65.0 in | Wt 178.2 lb

## 2016-09-23 DIAGNOSIS — B379 Candidiasis, unspecified: Secondary | ICD-10-CM

## 2016-09-23 DIAGNOSIS — R399 Unspecified symptoms and signs involving the genitourinary system: Secondary | ICD-10-CM | POA: Diagnosis not present

## 2016-09-23 DIAGNOSIS — N3090 Cystitis, unspecified without hematuria: Secondary | ICD-10-CM

## 2016-09-23 DIAGNOSIS — N390 Urinary tract infection, site not specified: Secondary | ICD-10-CM

## 2016-09-23 DIAGNOSIS — R8281 Pyuria: Secondary | ICD-10-CM

## 2016-09-23 LAB — URINALYSIS, COMPLETE
Bilirubin, UA: NEGATIVE
Glucose, UA: NEGATIVE
KETONES UA: NEGATIVE
NITRITE UA: POSITIVE — AB
PH UA: 6.5 (ref 5.0–7.5)
Protein, UA: NEGATIVE
SPEC GRAV UA: 1.015 (ref 1.005–1.030)
Urobilinogen, Ur: 0.2 mg/dL (ref 0.2–1.0)

## 2016-09-23 LAB — MICROSCOPIC EXAMINATION

## 2016-09-23 MED ORDER — SULFAMETHOXAZOLE-TRIMETHOPRIM 800-160 MG PO TABS: 1.0000 | ORAL_TABLET | Freq: Two times a day (BID) | ORAL | 0 refills | Status: DC

## 2016-09-23 MED ORDER — FLUCONAZOLE 150 MG PO TABS: 150.0000 mg | ORAL_TABLET | ORAL | 0 refills | Status: DC | PRN

## 2016-09-23 NOTE — Progress Notes (Signed)
  Subjective:   Patient ID: Amanda Park, female    DOB: 1946-04-04, 71 y.o.   MRN: [id number] CC: Painful Urination and Urinary Urgency  HPI: Amanda Park is a 71 y.o. female presenting for Painful Urination and Urinary Urgency  No fevers Lower abd pain starting three days ago Taking azo Dysuria with each void Episode last week of nausea/vomiting Got better Appetite normal now No diarrhea No recent UTIs Last was years ago   Relevant past medical, surgical, family and social history reviewed. Allergies and medications reviewed and updated. History  Smoking Status  . Never Smoker  Smokeless Tobacco  . Never Used   ROS: Per HPI   Objective:    BP (!) 143/71   Pulse 73   Temp (!) 96.7 F (35.9 C) (Oral)   Ht 5\' 5"  (1.651 m)   Wt 178 lb 3.2 oz (80.8 kg)   BMI 29.65 kg/m   Wt Readings from Last 3 Encounters:  09/23/16 178 lb 3.2 oz (80.8 kg)  09/15/16 174 lb (78.9 kg)  06/16/16 177 lb (80.3 kg)    Gen: NAD, alert, cooperative with exam, NCAT EYES: EOMI, no conjunctival injection, or no icterus CV: NRRR, normal S1/S2, no murmur Resp: CTABL, no wheezes, normal WOB Abd: +BS, soft, NTND. no guarding or organomegaly, no CVA tenderness Ext: No edema, warm Neuro: Alert and oriented MSK: normal muscle bulk  Assessment & Plan:  Amanda Park was seen today for painful urination and urinary urgency. No fevers.  Diagnoses and all orders for this visit:  UTI symptoms -     Urinalysis, Complete  Pyuria -     Urine culture  Cystitis UA +LE, nit, trace blood Will treat with below -     sulfamethoxazole-trimethoprim (BACTRIM DS,SEPTRA DS) 800-160 MG tablet; Take 1 tablet by mouth 2 (two) times daily.  Yeast infection -     fluconazole (DIFLUCAN) 150 MG tablet; Take 1 tablet (150 mg total) by mouth every three (3) days as needed.  Follow up plan: As scheduled on [place], MD [place]

## 2016-09-25 ENCOUNTER — Encounter: Payer: Self-pay | Admitting: Nurse Practitioner

## 2016-09-25 ENCOUNTER — Ambulatory Visit (INDEPENDENT_AMBULATORY_CARE_PROVIDER_SITE_OTHER): Payer: Medicare HMO | Admitting: Nurse Practitioner

## 2016-09-25 VITALS — BP 118/61 | HR 56 | Temp 96.7°F | Ht 65.0 in | Wt 175.0 lb

## 2016-09-25 DIAGNOSIS — K219 Gastro-esophageal reflux disease without esophagitis: Secondary | ICD-10-CM | POA: Diagnosis not present

## 2016-09-25 DIAGNOSIS — G2581 Restless legs syndrome: Secondary | ICD-10-CM

## 2016-09-25 DIAGNOSIS — I10 Essential (primary) hypertension: Secondary | ICD-10-CM

## 2016-09-25 DIAGNOSIS — E785 Hyperlipidemia, unspecified: Secondary | ICD-10-CM | POA: Diagnosis not present

## 2016-09-25 DIAGNOSIS — Z6829 Body mass index (BMI) 29.0-29.9, adult: Secondary | ICD-10-CM

## 2016-09-25 LAB — URINE CULTURE

## 2016-09-25 MED ORDER — METOPROLOL SUCCINATE ER 25 MG PO TB24: 25.0000 mg | ORAL_TABLET | Freq: Every day | ORAL | 1 refills | Status: DC

## 2016-09-25 MED ORDER — OMEPRAZOLE 40 MG PO CPDR: DELAYED_RELEASE_CAPSULE | ORAL | 1 refills | Status: DC

## 2016-09-25 MED ORDER — PRAMIPEXOLE DIHYDROCHLORIDE 0.25 MG PO TABS: ORAL_TABLET | ORAL | 1 refills | Status: DC

## 2016-09-25 MED ORDER — SIMVASTATIN 40 MG PO TABS: ORAL_TABLET | ORAL | 1 refills | Status: DC

## 2016-09-25 NOTE — Progress Notes (Signed)
Subjective:    Patient ID: Amanda Park, female    DOB: January 30, 1946, 71 y.o.   MRN: [id number]   Patient here today for follow up of chronic medical problems. Patient has not been seen for follow up in over a year. THere have ben n changes since last visit. No complaints today.  Outpatient Encounter Prescriptions as of 09/13/2015  Medication Sig  . aspirin EC 81 MG tablet Take 81 mg by mouth daily.  Marland Kitchen atenolol (TENORMIN) 25 MG tablet Take 1 tablet (25 mg total) by mouth daily.  . cetirizine (ZYRTEC) 10 MG tablet Take 1 tablet (10 mg total) by mouth daily.  Marland Kitchen omeprazole (PRILOSEC) 40 MG capsule TAKE 1 CAPSULE EVERY DAY  . pramipexole (MIRAPEX) 0.25 MG tablet TAKE ONE QHS  . simvastatin (ZOCOR) 40 MG tablet TAKE ONE DAILY        Hypertension  This is a chronic problem. The current episode started more than 1 year ago. The problem is controlled. Pertinent negatives include no chest pain, headaches, neck pain, palpitations or shortness of breath. Risk factors for coronary artery disease include post-menopausal state and dyslipidemia. Past treatments include beta blockers. There are no compliance problems.   Hyperlipidemia  This is a chronic problem. The current episode started more than 1 year ago. Recent lipid tests were reviewed and are normal. Pertinent negatives include no chest pain or shortness of breath. Current antihyperlipidemic treatment includes statins. The current treatment provides significant improvement of lipids. There are no compliance problems.  Risk factors for coronary artery disease include dyslipidemia, hypertension and post-menopausal.  RLS Mirapex daily helps with symptoms.  GERD Omeprazole keeps symtoms under control.   Review of Systems  Constitutional: Negative.   HENT: Negative.   Eyes: Negative.   Respiratory: Negative.  Negative for shortness of breath.   Cardiovascular: Negative for chest pain and palpitations.  Endocrine: Negative.   Genitourinary:  Negative.   Musculoskeletal: Negative.  Negative for neck pain.  Allergic/Immunologic: Negative.   Neurological: Negative.  Negative for headaches.  Hematological: Negative.   Psychiatric/Behavioral: Negative.        Objective:   Physical Exam  Constitutional: She is oriented to person, place, and time. She appears well-developed and well-nourished.  HENT:  Nose: Nose normal.  Mouth/Throat: Oropharynx is clear and moist.  Eyes: EOM are normal.  Neck: Trachea normal, normal range of motion and full passive range of motion without pain. Neck supple. No JVD present. Carotid bruit is not present. No thyromegaly present.  Cardiovascular: Normal rate, regular rhythm, normal heart sounds and intact distal pulses.  Exam reveals no gallop and no friction rub.   No murmur heard. Pulmonary/Chest: Effort normal and breath sounds normal. No respiratory distress.  Abdominal: Soft. Bowel sounds are normal. She exhibits no distension and no mass. There is no tenderness.  Musculoskeletal: Normal range of motion.  Lymphadenopathy:    She has no cervical adenopathy.  Neurological: She is alert and oriented to person, place, and time. She has normal reflexes.  Skin: Skin is warm and dry.  Psychiatric: She has a normal mood and affect. Her behavior is normal. Judgment and thought content normal.    BP 118/61   Pulse (!) 56   Temp (!) 96.7 F (35.9 C) (Oral)   Ht _0  (1.651 m)   Wt 175 lb (79.4 kg)   BMI 29.12 kg/m       Assessment & Plan:   1. Essential hypertension, benign Low sodium diet - CMP14+EGFR -  metoprolol succinate (TOPROL-XL) 25 MG 24 hr tablet; Take 1 tablet (25 mg total) by mouth daily.  Dispense: 90 tablet; Refill: 1  2. Hyperlipidemia with target LDL less than 100 Low fat diet - Lipid panel - simvastatin (ZOCOR) 40 MG tablet; TAKE ONE DAILY  Dispense: 90 tablet; Refill: 1  3. RLS (restless legs syndrome) Keep legs warm at noght - pramipexole (MIRAPEX) 0.25 MG tablet;  TAKE ONE QHS  Dispense: 90 tablet; Refill: 1  4. BMI 29.0-29.9,adult Discussed diet and exercise for person with BMI >25 Will recheck weight in 3-6 months  5. Gastroesophageal reflux disease without esophagitis Avoid spicy foods Do not eat 2 hours prior to bedtime - omeprazole (PRILOSEC) 40 MG capsule; TAKE 1 CAPSULE EVERY DAY  Dispense: 90 capsule; Refill: 1    Labs pending Health maintenance reviewed Diet and exercise encouraged Continue all meds Follow up  In 6 months   [place], FNP

## 2016-09-25 NOTE — Patient Instructions (Signed)

## 2016-09-26 LAB — CMP14+EGFR
ALK PHOS: 51 IU/L (ref 39–117)
ALT: 20 IU/L (ref 0–32)
AST: 16 IU/L (ref 0–40)
Albumin/Globulin Ratio: 2.3 — ABNORMAL HIGH (ref 1.2–2.2)
Albumin: 4.5 g/dL (ref 3.5–4.8)
BILIRUBIN TOTAL: 0.4 mg/dL (ref 0.0–1.2)
BUN / CREAT RATIO: 11 — AB (ref 12–28)
BUN: 15 mg/dL (ref 8–27)
CHLORIDE: 103 mmol/L (ref 96–106)
CO2: 21 mmol/L (ref 18–29)
Calcium: 9 mg/dL (ref 8.7–10.3)
Creatinine, Ser: 1.37 mg/dL — ABNORMAL HIGH (ref 0.57–1.00)
GFR calc Af Amer: 45 mL/min/{1.73_m2} — ABNORMAL LOW (ref >59)
GFR calc non Af Amer: 39 mL/min/{1.73_m2} — ABNORMAL LOW (ref >59)
GLUCOSE: 97 mg/dL (ref 65–99)
Globulin, Total: 2 g/dL (ref 1.5–4.5)
POTASSIUM: 4.4 mmol/L (ref 3.5–5.2)
Sodium: 140 mmol/L (ref 134–144)
Total Protein: 6.5 g/dL (ref 6.0–8.5)

## 2016-09-26 LAB — LIPID PANEL
CHOLESTEROL TOTAL: 162 mg/dL (ref 100–199)
Chol/HDL Ratio: 3.4 ratio units (ref 0.0–4.4)
HDL: 47 mg/dL (ref >39)
LDL Calculated: 90 mg/dL (ref 0–99)
Triglycerides: 124 mg/dL (ref 0–149)
VLDL CHOLESTEROL CAL: 25 mg/dL (ref 5–40)

## 2016-11-09 ENCOUNTER — Ambulatory Visit (INDEPENDENT_AMBULATORY_CARE_PROVIDER_SITE_OTHER): Payer: Medicare HMO | Admitting: Nurse Practitioner

## 2016-11-09 ENCOUNTER — Encounter: Payer: Self-pay | Admitting: Nurse Practitioner

## 2016-11-09 VITALS — BP 139/64 | HR 70 | Temp 97.0°F | Ht 65.0 in | Wt 176.0 lb

## 2016-11-09 DIAGNOSIS — L255 Unspecified contact dermatitis due to plants, except food: Secondary | ICD-10-CM | POA: Diagnosis not present

## 2016-11-09 DIAGNOSIS — W57XXXA Bitten or stung by nonvenomous insect and other nonvenomous arthropods, initial encounter: Secondary | ICD-10-CM

## 2016-11-09 MED ORDER — PREDNISONE 20 MG PO TABS: ORAL_TABLET | ORAL | 0 refills | Status: DC

## 2016-11-09 MED ORDER — METHYLPREDNISOLONE ACETATE 80 MG/ML IJ SUSP
80.0000 mg | Freq: Once | INTRAMUSCULAR | Status: AC
  Administered 2016-11-09: 80 mg via INTRAMUSCULAR

## 2016-11-09 MED ORDER — DOXYCYCLINE HYCLATE 100 MG PO TABS: 100.0000 mg | ORAL_TABLET | Freq: Two times a day (BID) | ORAL | 0 refills | Status: DC

## 2016-11-09 NOTE — Progress Notes (Addendum)
   Subjective:    Patient ID: Amanda Park, female    DOB: 06/08/1946, 71 y.o.   MRN: [id number]  HPI  Patient comes in today stating she was dong yard work over the weekend and got into some poison oak. Now has rash all over.  * tick bite right groin area-  Not sure how long tick was there  Review of Systems  Constitutional: Negative.   Respiratory: Negative.   Cardiovascular: Negative.   Skin: Positive for rash.  Neurological: Negative.   Psychiatric/Behavioral: Negative.   All other systems reviewed and are negative.      Objective:   Physical Exam  Constitutional: She is oriented to person, place, and time. She appears well-developed and well-nourished. She appears distressed (moderate).  Cardiovascular: Normal rate.   Pulmonary/Chest: Effort normal and breath sounds normal.  Neurological: She is alert and oriented to person, place, and time.  Skin: Skin is warm. Rash (erythenatous maculopapular vesicular rash in linear patterns on face, right sidde of neck, bil axillia, forearms and  thighs) noted.  2cm macular erythematous spot right groin  Psychiatric: She has a normal mood and affect. Her behavior is normal. Judgment and thought content normal.   BP 139/64   Pulse 70   Temp 97 F (36.1 C) (Oral)   Ht 5\' 5"  (1.651 m)   Wt 176 lb (79.8 kg)   BMI 29.29 kg/m         Assessment & Plan:  1. Contact dermatitis due to plant Avoid scratching Benadryl OTC for itching Cool compresses clalmine lotion OTC RTO prn - methylPREDNISolone acetate (DEPO-MEDROL) injection 80 mg; Inject 1 mL (80 mg total) into the muscle once. - predniSONE (DELTASONE) 20 MG tablet; 2 po at sametime daily for 5 days- start tomorrow  Dispense: 10 tablet; Refill: 0  2. Tick bite - doxycyclline 100mg  1 po BID x14 days  Mary-Margaret Hassell Done, FNP

## 2016-11-09 NOTE — Patient Instructions (Signed)
Poison Oak Dermatitis Poison oak dermatitis is redness and soreness (inflammation) of the skin. It is caused by a chemical that is on the leaves of the poison oak plant. You may also have itching, a rash, and blisters. Symptoms often clear up in 1-2 weeks. You may get this condition by touching a poison oak plant. You can also get it by touching something that has the chemical on it. This may include animals or objects that have come in contact with the plant. Follow these instructions at home: General instructions   Take or apply over-the-counter and prescription medicines only as told by your doctor.  If you touch poison oak, wash your skin with soap and cold water right away.  Use hydrocortisone creams or calamine lotion as needed to help with itching.  Take oatmeal baths as needed. Use colloidal oatmeal. You can get this at a pharmacy or grocery store. Follow the instructions on the package.  Do not scratch or rub your skin.  While you have the rash, wash your clothes right after you wear them. Prevention    Know what poison oak looks like so you can avoid it. This plant has three leaves with flowering branches on a single stem. The leaves are fuzzy. They have a toothlike edge.  If you have touched poison oak, wash with soap and water right away. Be sure to wash under your fingernails.  When hiking or camping, wear long pants, a long-sleeved shirt, tall socks, and hiking boots. You can also use a lotion on your skin that helps to prevent contact with the chemical on the plant.  If you think that your clothes or outdoor gear came in contact with poison oak, rinse them off with a garden hose before you bring them inside your house. Contact a doctor if:  You have open sores in the rash area.  You have more redness, swelling, or pain in the affected area.  You have redness that spreads beyond the rash area.  You have fluid, blood, or pus coming from the affected area.  You have a  fever.  You have a rash over a large area of your body.  You have a rash on your eyes, mouth, or genitals.  Your rash does not improve after a few days. Get help right away if:  Your face swells or your eyes swell shut.  You have trouble breathing.  You have trouble swallowing. This information is not intended to replace advice given to you by your health care provider. Make sure you discuss any questions you have with your health care provider. Document Released: 08/15/2010 Document Revised: 12/19/2015 Document Reviewed: 12/19/2014 Elsevier Interactive Patient Education  2017 [employer].

## 2016-11-09 NOTE — Addendum Note (Signed)
Addended by: [name] on: 11/09/2016 03:42 PM   Modules accepted: Orders

## 2017-01-14 DIAGNOSIS — L814 Other melanin hyperpigmentation: Secondary | ICD-10-CM | POA: Diagnosis not present

## 2017-01-14 DIAGNOSIS — L820 Inflamed seborrheic keratosis: Secondary | ICD-10-CM | POA: Diagnosis not present

## 2017-01-14 DIAGNOSIS — L821 Other seborrheic keratosis: Secondary | ICD-10-CM | POA: Diagnosis not present

## 2017-01-14 DIAGNOSIS — L579 Skin changes due to chronic exposure to nonionizing radiation, unspecified: Secondary | ICD-10-CM | POA: Diagnosis not present

## 2017-01-14 DIAGNOSIS — D1722 Benign lipomatous neoplasm of skin and subcutaneous tissue of left arm: Secondary | ICD-10-CM | POA: Diagnosis not present

## 2017-01-14 DIAGNOSIS — B079 Viral wart, unspecified: Secondary | ICD-10-CM | POA: Diagnosis not present

## 2017-01-14 DIAGNOSIS — D1801 Hemangioma of skin and subcutaneous tissue: Secondary | ICD-10-CM | POA: Diagnosis not present

## 2017-02-04 DIAGNOSIS — B079 Viral wart, unspecified: Secondary | ICD-10-CM | POA: Diagnosis not present

## 2017-03-08 DIAGNOSIS — D1722 Benign lipomatous neoplasm of skin and subcutaneous tissue of left arm: Secondary | ICD-10-CM | POA: Diagnosis not present

## 2017-03-15 DIAGNOSIS — D1722 Benign lipomatous neoplasm of skin and subcutaneous tissue of left arm: Secondary | ICD-10-CM | POA: Diagnosis not present

## 2017-03-15 HISTORY — PX: OTHER SURGICAL HISTORY: SHX169

## 2017-03-30 ENCOUNTER — Ambulatory Visit (INDEPENDENT_AMBULATORY_CARE_PROVIDER_SITE_OTHER): Payer: Medicare HMO | Admitting: Nurse Practitioner

## 2017-03-30 ENCOUNTER — Encounter: Payer: Self-pay | Admitting: Nurse Practitioner

## 2017-03-30 ENCOUNTER — Ambulatory Visit (INDEPENDENT_AMBULATORY_CARE_PROVIDER_SITE_OTHER): Payer: Medicare HMO

## 2017-03-30 ENCOUNTER — Encounter (INDEPENDENT_AMBULATORY_CARE_PROVIDER_SITE_OTHER): Payer: Self-pay

## 2017-03-30 VITALS — BP 135/77 | HR 62 | Temp 96.8°F | Ht 65.0 in | Wt 174.0 lb

## 2017-03-30 DIAGNOSIS — Z13820 Encounter for screening for osteoporosis: Secondary | ICD-10-CM

## 2017-03-30 DIAGNOSIS — Z780 Asymptomatic menopausal state: Secondary | ICD-10-CM | POA: Diagnosis not present

## 2017-03-30 DIAGNOSIS — E785 Hyperlipidemia, unspecified: Secondary | ICD-10-CM | POA: Diagnosis not present

## 2017-03-30 DIAGNOSIS — I10 Essential (primary) hypertension: Secondary | ICD-10-CM | POA: Diagnosis not present

## 2017-03-30 DIAGNOSIS — Z6829 Body mass index (BMI) 29.0-29.9, adult: Secondary | ICD-10-CM | POA: Diagnosis not present

## 2017-03-30 DIAGNOSIS — K219 Gastro-esophageal reflux disease without esophagitis: Secondary | ICD-10-CM

## 2017-03-30 DIAGNOSIS — Z0000 Encounter for general adult medical examination without abnormal findings: Secondary | ICD-10-CM

## 2017-03-30 DIAGNOSIS — G2581 Restless legs syndrome: Secondary | ICD-10-CM

## 2017-03-30 DIAGNOSIS — J301 Allergic rhinitis due to pollen: Secondary | ICD-10-CM

## 2017-03-30 MED ORDER — METOPROLOL SUCCINATE ER 25 MG PO TB24: 25.0000 mg | ORAL_TABLET | Freq: Every day | ORAL | 1 refills | Status: DC

## 2017-03-30 MED ORDER — OMEPRAZOLE 40 MG PO CPDR: DELAYED_RELEASE_CAPSULE | ORAL | 1 refills | Status: DC

## 2017-03-30 MED ORDER — PRAMIPEXOLE DIHYDROCHLORIDE 0.25 MG PO TABS: ORAL_TABLET | ORAL | 1 refills | Status: DC

## 2017-03-30 MED ORDER — SIMVASTATIN 40 MG PO TABS: ORAL_TABLET | ORAL | 1 refills | Status: DC

## 2017-03-30 MED ORDER — CETIRIZINE HCL 10 MG PO TABS: 10.0000 mg | ORAL_TABLET | Freq: Every day | ORAL | 1 refills | Status: DC

## 2017-03-30 NOTE — Progress Notes (Addendum)
Subjective:    Patient ID: Amanda Park, female    DOB: 29-Aug-1945, 71 y.o.   MRN: [id number]  HPI  Amanda Park is here today for annual physical andfollow up of chronic medical problem.  Outpatient Encounter Prescriptions as of 03/30/2017  Medication Sig  . aspirin EC 81 MG tablet Take 81 mg by mouth daily.  . cetirizine (ZYRTEC) 10 MG tablet Take 1 tablet (10 mg total) by mouth daily.  . metoprolol succinate (TOPROL-XL) 25 MG 24 hr tablet Take 1 tablet (25 mg total) by mouth daily.  Marland Kitchen omeprazole (PRILOSEC) 40 MG capsule TAKE 1 CAPSULE EVERY DAY  . pramipexole (MIRAPEX) 0.25 MG tablet TAKE ONE QHS  . simvastatin (ZOCOR) 40 MG tablet TAKE ONE DAILY     1. Essential hypertension, benign  No c/o chest pain, SOB or headache. Checks blood pressure occasionally at home and systolic is always in 324'4  2. Seasonal allergic rhinitis due to pollen  Uses zyrtec OTC which works well for her  3. Gastroesophageal reflux disease without esophagitis  Takes omeprazole OTC daly- keeps symptoms under control  4. RLS (restless legs syndrome)  mirapex at night keeps symptoms under control  5. Hyperlipidemia with target LDL less than 100  Tries to watch diet  6. BMI 29.0-29.9,adult  No recent weight changes    New complaints: None today      Review of Systems  Constitutional: Negative for activity change and appetite change.  HENT: Negative.   Eyes: Negative for pain.  Respiratory: Negative for shortness of breath.   Cardiovascular: Negative for chest pain, palpitations and leg swelling.  Gastrointestinal: Negative for abdominal pain.  Endocrine: Negative for polydipsia.  Genitourinary: Negative.   Skin: Negative for rash.  Neurological: Negative for dizziness, weakness and headaches.  Hematological: Does not bruise/bleed easily.  Psychiatric/Behavioral: Negative.   All other systems reviewed and are negative.      Objective:   Physical Exam  Constitutional: She is oriented  to person, place, and time. She appears well-developed and well-nourished.  HENT:  Nose: Nose normal.  Mouth/Throat: Oropharynx is clear and moist.  Eyes: EOM are normal.  Neck: Trachea normal, normal range of motion and full passive range of motion without pain. Neck supple. No JVD present. Carotid bruit is not present. No thyromegaly present.  Cardiovascular: Normal rate, regular rhythm, normal heart sounds and intact distal pulses.  Exam reveals no gallop and no friction rub.   No murmur heard. Pulmonary/Chest: Effort normal and breath sounds normal.  Abdominal: Soft. Bowel sounds are normal. She exhibits no distension and no mass. There is no tenderness.  Musculoskeletal: Normal range of motion.  Lymphadenopathy:    She has no cervical adenopathy.  Neurological: She is alert and oriented to person, place, and time. She has normal reflexes.  Skin: Skin is warm and dry.  Psychiatric: She has a normal mood and affect. Her behavior is normal. Judgment and thought content normal.   BP 135/77   Pulse 62   Temp (!) 96.8 F (36 C) (Oral)   Ht 5' 5"  (1.651 m)   Wt 174 lb (78.9 kg)   BMI 28.96 kg/m       Assessment & Plan:  1. Essential hypertension, benign Low sodium diet - metoprolol succinate (TOPROL-XL) 25 MG 24 hr tablet; Take 1 tablet (25 mg total) by mouth daily.  Dispense: 90 tablet; Refill: 1 - CMP14+EGFR  2. Seasonal allergic rhinitis due to pollen - cetirizine (ZYRTEC) 10 MG tablet; Take 1  tablet (10 mg total) by mouth daily.  Dispense: 90 tablet; Refill: 1  3. Gastroesophageal reflux disease without esophagitis Avoid spicy foods Do not eat 2 hours prior to bedtime - omeprazole (PRILOSEC) 40 MG capsule; TAKE 1 CAPSULE EVERY DAY  Dispense: 90 capsule; Refill: 1  4. RLS (restless legs syndrome) Keep legs warm at night - pramipexole (MIRAPEX) 0.25 MG tablet; TAKE ONE QHS  Dispense: 90 tablet; Refill: 1  5. Hyperlipidemia with target LDL less than 100 Low fat diet -  simvastatin (ZOCOR) 40 MG tablet; TAKE ONE DAILY  Dispense: 90 tablet; Refill: 1 - Lipid panel  6. BMI 29.0-29.9,adult Discussed diet and exercise for person with BMI >25 Will recheck weight in 3-6 months  7. Annual physical exam - CBC with Differential/Platelet - Thyroid Panel With TSH    Labs pending Health maintenance reviewed Diet and exercise encouraged Continue all meds Follow up  In 3 months   [place], FNP

## 2017-03-30 NOTE — Patient Instructions (Signed)
Food Choices for Gastroesophageal Reflux Disease, Adult When you have gastroesophageal reflux disease (GERD), the foods you eat and your eating habits are very important. Choosing the right foods can help ease your discomfort. What guidelines do I need to follow?  Choose fruits, vegetables, whole grains, and low-fat dairy products.  Choose low-fat meat, fish, and poultry.  Limit fats such as oils, salad dressings, butter, nuts, and avocado.  Keep a food diary. This helps you identify foods that cause symptoms.  Avoid foods that cause symptoms. These may be different for everyone.  Eat small meals often instead of 3 large meals a day.  Eat your meals slowly, in a place where you are relaxed.  Limit fried foods.  Cook foods using methods other than frying.  Avoid drinking alcohol.  Avoid drinking large amounts of liquids with your meals.  Avoid bending over or lying down until 2-3 hours after eating. What foods are not recommended? These are some foods and drinks that may make your symptoms worse: Vegetables  Tomatoes. Tomato juice. Tomato and spaghetti sauce. Chili peppers. Onion and garlic. Horseradish. Fruits  Oranges, grapefruit, and lemon (fruit and juice). Meats  High-fat meats, fish, and poultry. This includes hot dogs, ribs, ham, sausage, salami, and bacon. Dairy  Whole milk and chocolate milk. Sour cream. Cream. Butter. Ice cream. Cream cheese. Drinks  Coffee and tea. Bubbly (carbonated) drinks or energy drinks. Condiments  Hot sauce. Barbecue sauce. Sweets/Desserts  Chocolate and cocoa. Donuts. Peppermint and spearmint. Fats and Oils  High-fat foods. This includes French fries and potato chips. Other  Vinegar. Strong spices. This includes black pepper, white pepper, red pepper, cayenne, curry powder, cloves, ginger, and chili powder. The items listed above may not be a complete list of foods and drinks to avoid. Contact your dietitian for more information.    This information is not intended to replace advice given to you by your health care provider. Make sure you discuss any questions you have with your health care provider. Document Released: 01/12/2012 Document Revised: 12/19/2015 Document Reviewed: 05/17/2013 Elsevier Interactive Patient Education  2017 [employer]  

## 2017-03-31 LAB — CBC WITH DIFFERENTIAL/PLATELET
Basophils Absolute: 0 10*3/uL (ref 0.0–0.2)
Basos: 0 %
EOS (ABSOLUTE): 0.1 10*3/uL (ref 0.0–0.4)
EOS: 1 %
Hematocrit: 40 % (ref 34.0–46.6)
Hemoglobin: 13 g/dL (ref 11.1–15.9)
IMMATURE GRANULOCYTES: 0 %
Immature Grans (Abs): 0 10*3/uL (ref 0.0–0.1)
Lymphocytes Absolute: 1.9 10*3/uL (ref 0.7–3.1)
Lymphs: 35 %
MCH: 29.3 pg (ref 26.6–33.0)
MCHC: 32.5 g/dL (ref 31.5–35.7)
MCV: 90 fL (ref 79–97)
MONOS ABS: 0.3 10*3/uL (ref 0.1–0.9)
Monocytes: 5 %
NEUTROS PCT: 59 %
Neutrophils Absolute: 3.2 10*3/uL (ref 1.4–7.0)
Platelets: 212 10*3/uL (ref 150–379)
RBC: 4.43 x10E6/uL (ref 3.77–5.28)
RDW: 13.6 % (ref 12.3–15.4)
WBC: 5.4 10*3/uL (ref 3.4–10.8)

## 2017-03-31 LAB — THYROID PANEL WITH TSH
Free Thyroxine Index: 2.6 (ref 1.2–4.9)
T3 Uptake Ratio: 29 % (ref 24–39)
T4, Total: 8.9 ug/dL (ref 4.5–12.0)
TSH: 2.94 u[IU]/mL (ref 0.450–4.500)

## 2017-03-31 LAB — LIPID PANEL
Chol/HDL Ratio: 3.6 ratio (ref 0.0–4.4)
Cholesterol, Total: 153 mg/dL (ref 100–199)
HDL: 42 mg/dL (ref >39)
LDL CALC: 77 mg/dL (ref 0–99)
Triglycerides: 170 mg/dL — ABNORMAL HIGH (ref 0–149)
VLDL CHOLESTEROL CAL: 34 mg/dL (ref 5–40)

## 2017-03-31 LAB — CMP14+EGFR
ALBUMIN: 4.6 g/dL (ref 3.5–4.8)
ALT: 21 IU/L (ref 0–32)
AST: 18 IU/L (ref 0–40)
Albumin/Globulin Ratio: 2.1 (ref 1.2–2.2)
Alkaline Phosphatase: 59 IU/L (ref 39–117)
BILIRUBIN TOTAL: 0.4 mg/dL (ref 0.0–1.2)
BUN / CREAT RATIO: 13 (ref 12–28)
BUN: 15 mg/dL (ref 8–27)
CHLORIDE: 104 mmol/L (ref 96–106)
CO2: 23 mmol/L (ref 20–29)
Calcium: 9.4 mg/dL (ref 8.7–10.3)
Creatinine, Ser: 1.18 mg/dL — ABNORMAL HIGH (ref 0.57–1.00)
GFR calc non Af Amer: 47 mL/min/{1.73_m2} — ABNORMAL LOW (ref >59)
GFR, EST AFRICAN AMERICAN: 54 mL/min/{1.73_m2} — AB (ref >59)
GLOBULIN, TOTAL: 2.2 g/dL (ref 1.5–4.5)
GLUCOSE: 95 mg/dL (ref 65–99)
Potassium: 4.3 mmol/L (ref 3.5–5.2)
SODIUM: 142 mmol/L (ref 134–144)
TOTAL PROTEIN: 6.8 g/dL (ref 6.0–8.5)

## 2017-04-12 ENCOUNTER — Ambulatory Visit (INDEPENDENT_AMBULATORY_CARE_PROVIDER_SITE_OTHER): Payer: Medicare HMO | Admitting: Nurse Practitioner

## 2017-04-12 ENCOUNTER — Encounter: Payer: Self-pay | Admitting: Nurse Practitioner

## 2017-04-12 VITALS — BP 141/86 | HR 58 | Temp 96.9°F | Ht 65.0 in | Wt 171.0 lb

## 2017-04-12 DIAGNOSIS — H1033 Unspecified acute conjunctivitis, bilateral: Secondary | ICD-10-CM | POA: Diagnosis not present

## 2017-04-12 MED ORDER — POLYMYXIN B-TRIMETHOPRIM 10000-0.1 UNIT/ML-% OP SOLN: 2.0000 [drp] | Freq: Four times a day (QID) | OPHTHALMIC | 0 refills | Status: DC

## 2017-04-12 NOTE — Patient Instructions (Signed)

## 2017-04-12 NOTE — Progress Notes (Signed)
   Subjective:    Patient ID: Amanda Park, female    DOB: 27-Apr-1946, 71 y.o.   MRN: [id number]  HPI Noelly comes in today c/o both eyes red and itching with drainage bil. Both eyes were matted together this morning. Started about 4 days ago.    Review of Systems  Constitutional: Negative.   HENT: Negative.   Eyes: Positive for discharge, redness and itching.  Respiratory: Negative.   Cardiovascular: Negative.   Neurological: Negative.   Psychiatric/Behavioral: Negative.   All other systems reviewed and are negative.      Objective:   Physical Exam  Constitutional: She is oriented to person, place, and time. She appears well-developed and well-nourished. No distress.  HENT:  Right Ear: External ear normal.  Left Ear: External ear normal.  Mouth/Throat: Oropharynx is clear and moist.  Eyes: Pupils are equal, round, and reactive to light. Right eye exhibits discharge. Left eye exhibits discharge.  bil scleral injection with erythematous conjunctiva bil  Cardiovascular: Normal rate and regular rhythm.   Pulmonary/Chest: Effort normal and breath sounds normal.  Neurological: She is alert and oriented to person, place, and time.  Skin: Skin is warm.  Psychiatric: She has a normal mood and affect. Her behavior is normal. Judgment and thought content normal.   BP (!) 141/86   Pulse (!) 58   Temp (!) 96.9 F (36.1 C) (Oral)   Ht 5\' 5"  (1.651 m)   Wt 171 lb (77.6 kg)   BMI 28.46 kg/m        Assessment & Plan:   1. Acute bacterial conjunctivitis of both eyes    Meds ordered this encounter  Medications  . trimethoprim-polymyxin b (POLYTRIM) ophthalmic solution    Sig: Place 2 drops into both eyes every 6 (six) hours.    Dispense:  10 mL    Refill:  0    Order Specific Question:   Supervising Provider    Answer:   Eustaquio Maize [[id number]]   Use drops every 2 hours today then 4x a day for 6 days Cool compresses Avoid rubbing eyes Good handwashing RTO  prn  Mary-Margaret Hassell Done, FNP

## 2017-04-16 ENCOUNTER — Encounter: Payer: Self-pay | Admitting: *Deleted

## 2017-04-21 ENCOUNTER — Ambulatory Visit (INDEPENDENT_AMBULATORY_CARE_PROVIDER_SITE_OTHER): Payer: Medicare HMO

## 2017-04-21 DIAGNOSIS — Z23 Encounter for immunization: Secondary | ICD-10-CM

## 2017-06-07 ENCOUNTER — Ambulatory Visit: Payer: Medicare HMO | Admitting: Family Medicine

## 2017-06-07 ENCOUNTER — Encounter: Payer: Self-pay | Admitting: Family Medicine

## 2017-06-07 VITALS — BP 150/87 | HR 75 | Temp 96.6°F | Ht 65.0 in | Wt 173.0 lb

## 2017-06-07 DIAGNOSIS — H1013 Acute atopic conjunctivitis, bilateral: Secondary | ICD-10-CM

## 2017-06-07 DIAGNOSIS — N3001 Acute cystitis with hematuria: Secondary | ICD-10-CM

## 2017-06-07 DIAGNOSIS — R300 Dysuria: Secondary | ICD-10-CM | POA: Diagnosis not present

## 2017-06-07 LAB — MICROSCOPIC EXAMINATION

## 2017-06-07 LAB — URINALYSIS, COMPLETE
BILIRUBIN UA: NEGATIVE
GLUCOSE, UA: NEGATIVE
KETONES UA: NEGATIVE
NITRITE UA: NEGATIVE
PROTEIN UA: NEGATIVE
SPEC GRAV UA: 1.025 (ref 1.005–1.030)
UUROB: 0.2 mg/dL (ref 0.2–1.0)
pH, UA: 6 (ref 5.0–7.5)

## 2017-06-07 MED ORDER — FLUCONAZOLE 150 MG PO TABS: 150.0000 mg | ORAL_TABLET | Freq: Once | ORAL | 0 refills | Status: AC

## 2017-06-07 MED ORDER — OLOPATADINE HCL 0.1 % OP SOLN: 1.0000 [drp] | Freq: Two times a day (BID) | OPHTHALMIC | 1 refills | Status: DC

## 2017-06-07 MED ORDER — SULFAMETHOXAZOLE-TRIMETHOPRIM 800-160 MG PO TABS: 1.0000 | ORAL_TABLET | Freq: Two times a day (BID) | ORAL | 0 refills | Status: DC

## 2017-06-07 NOTE — Progress Notes (Signed)
Subjective: CC: Urinary symptoms PCP: [name], FNP Amanda Park is a 71 y.o. female presenting to clinic today for:  1. Urinary symptoms Patient reports a 3 day h/o dysuria, urinary frequency and urgency.  She notes associated vaginal odor but no discharge or bleeding.  No pelvic pain, nausea, vomiting, fevers.  Denies abdominal pain, new back pain, vaginal discharge.  Patient has used AZO for symptoms.  Patient denies a h/o frequent or recurrent UTIs.  She notes that she does get a yeast infection with antibiotic use.  2.  Conjunctivitis Patient reports that she frequently has conjunctival erythema, particularly when she wakes up in the morning.  She denies purulence, fevers, chills, ocular pain or visual disturbance.  She was previously prescribed Polytrim eyedrops for symptoms.  She is wondering if she can get a refill on this today.  No Known Allergies Past Medical History:  Diagnosis Date  . Allergy   . Hyperlipidemia   . Hypertension   . Osteopenia   . PVC (premature ventricular contraction)    Family History  Problem Relation Age of Onset  . Cancer Mother 63       cancer  . Hypertension Mother   . Heart disease Paternal Grandfather   . Prostate cancer Father   . Prostate cancer Brother   . Stomach cancer Paternal Uncle   . Colon cancer Neg Hx     Current Outpatient Medications:  .  aspirin EC 81 MG tablet, Take 81 mg by mouth daily., Disp: , Rfl:  .  cetirizine (ZYRTEC) 10 MG tablet, Take 1 tablet (10 mg total) by mouth daily., Disp: 90 tablet, Rfl: 1 .  metoprolol succinate (TOPROL-XL) 25 MG 24 hr tablet, Take 1 tablet (25 mg total) by mouth daily., Disp: 90 tablet, Rfl: 1 .  omeprazole (PRILOSEC) 40 MG capsule, TAKE 1 CAPSULE EVERY DAY, Disp: 90 capsule, Rfl: 1 .  pramipexole (MIRAPEX) 0.25 MG tablet, TAKE ONE QHS, Disp: 90 tablet, Rfl: 1 .  simvastatin (ZOCOR) 40 MG tablet, TAKE ONE DAILY, Disp: 90 tablet, Rfl: 1 .  trimethoprim-polymyxin b  (POLYTRIM) ophthalmic solution, Place 2 drops into both eyes every 6 (six) hours., Disp: 10 mL, Rfl: 0  Social Hx: non smoker.  Health Maintenance:Flu   ROS: Per HPI  Objective: Office vital signs reviewed. BP (!) 150/87   Pulse 75   Temp (!) 96.6 F (35.9 C) (Oral)   Ht 5\' 5"  (1.651 m)   Wt 173 lb (78.5 kg)   BMI 28.79 kg/m   Physical Examination:  General: Awake, alert, well nourished, well appearing female, No acute distress HEENT: sclera white, EOMI, no ocular discharge Cardio: regular rate, +2 DP Pulm: no wheeze, normal work of breathing on room air GU: No suprapubic tenderness to palpation.  No CVA tenderness to palpation.  Results for orders placed or performed in visit on 06/07/17 (from the past 24 hour(s))  Urinalysis, Complete     Status: Abnormal   Collection Time: 06/07/17  9:12 AM  Result Value Ref Range   Specific Gravity, UA 1.025 1.005 - 1.030   pH, UA 6.0 5.0 - 7.5   Color, UA Yellow Yellow   Appearance Ur Clear Clear   Leukocytes, UA 2+ (A) Negative   Protein, UA Negative Negative/Trace   Glucose, UA Negative Negative   Ketones, UA Negative Negative   RBC, UA Trace (A) Negative   Bilirubin, UA Negative Negative   Urobilinogen, Ur 0.2 0.2 - 1.0 mg/dL   Nitrite, UA Negative  Negative   Microscopic Examination See below:    Narrative   Performed at:  [address], [place], [place]  [id number] Lab Director: Colletta [place] [facility], Phone:  [phone]  Microscopic Examination     Status: Abnormal   Collection Time: 06/07/17  9:12 AM  Result Value Ref Range   WBC, UA >30 (A) 0 - 5 /hpf   RBC, UA 3-10 (A) 0 - 2 /hpf   Epithelial Cells (non renal) 0-10 0 - 10 /hpf   Renal Epithel, UA 0-10 (A) None seen /hpf   Mucus, UA Present Not Estab.   Bacteria, UA Few (A) None seen/Few   Narrative   Performed at:  [address] [place], [place]  [id number] Lab Director: Colletta [facility], Phone:  [phone]     Assessment/ Plan: 71 y.o. female   1. Acute cystitis with hematuria Patient is relatively well-appearing on exam.  No fever.  Blood pressure is somewhat elevated.  Patient is asymptomatic.  Urinalysis with trace red blood cells and 2+ leukocytes.  Urine microscopy remarkable for greater than 30 white blood cells, 3-10 red blood cells, few bacteria and mucus present.  Urine culture sent.  Her February 2018 urine culture was reviewed.  This grew E. coli that was sensitive to cephalosporins and Septra.  I have prescribed Septra DS to take twice a day for 5 days.  Will contact patient with results of urine culture once they are available.  She may continue the AZO for the next 48 hours.  I did instruct her to discontinue use after 48 hours so as not to mask possible persistent symptoms.  Encourage p.o. Hydration.  Additionally, Diflucan 150 mg p.o. x1 as needed yeast infection symptoms was sent in.  Strict return precautions and reasons for emergent evaluation in the emergency department review with patient.  They voiced understanding and will follow-up as needed. - Urinalysis, Complete - Urine Culture  2. Allergic conjunctivitis of both eyes Patient actually does not have conjunctivitis on today's exam.  However, her story is convincing for allergic conjunctivitis.  Discontinue Polytrim.  Patanol eyedrops each eye twice daily as needed allergic conjunctivitis symptoms sent into pharmacy.   Orders Placed This Encounter  Procedures  . Urine Culture  . Microscopic Examination  . Urinalysis, Complete   Meds ordered this encounter  Medications  . sulfamethoxazole-trimethoprim (BACTRIM DS) 800-160 MG tablet    Sig: Take 1 tablet 2 (two) times daily by mouth. x5 days    Dispense:  10 tablet    Refill:  0  . fluconazole (DIFLUCAN) 150 MG tablet    Sig: Take 1 tablet (150 mg total) once for 1 dose by mouth.    Dispense:  1 tablet    Refill:  0  . olopatadine (PATANOL) 0.1 % ophthalmic solution     Sig: Place 1 drop 2 (two) times daily into both eyes. (if needed for allergic conjunctivitis)    Dispense:  5 mL    Refill:  [place], DO [place] [phone]

## 2017-06-07 NOTE — Patient Instructions (Signed)
Your urine does suggest that you have a urinary tract infection.  I have sent in an antibiotic for you to take twice a day for the next 5 days.  You may continue the AZO as needed as directed for the next 2 days.  Beyond that, I do not want to using this medication as it may mask persistent symptoms.  Continue to hydrate well with plenty of water.  I have also added a urine culture.  You will be contacted with the results once they are available.  I have sent in a new eyedrop for allergic pinkeye.  You may use this twice a day as needed for allergic conjunctivitis.   Urinary Tract Infection, Adult A urinary tract infection (UTI) is an infection of any part of the urinary tract. The urinary tract includes the:  Kidneys.  Ureters.  Bladder.  Urethra.  These organs make, store, and get rid of pee (urine) in the body. Follow these instructions at home:  Take over-the-counter and prescription medicines only as told by your doctor.  If you were prescribed an antibiotic medicine, take it as told by your doctor. Do not stop taking the antibiotic even if you start to feel better.  Avoid the following drinks: ? Alcohol. ? Caffeine. ? Tea. ? Carbonated drinks.  Drink enough fluid to keep your pee clear or pale yellow.  Keep all follow-up visits as told by your doctor. This is important.  Make sure to: ? Empty your bladder often and completely. Do not to hold pee for long periods of time. ? Empty your bladder before and after sex. ? Wipe from front to back after a bowel movement if you are female. Use each tissue one time when you wipe. Contact a doctor if:  You have back pain.  You have a fever.  You feel sick to your stomach (nauseous).  You throw up (vomit).  Your symptoms do not get better after 3 days.  Your symptoms go away and then come back. Get help right away if:  You have very bad back pain.  You have very bad lower belly (abdominal) pain.  You are throwing up  and cannot keep down any medicines or water. This information is not intended to replace advice given to you by your health care provider. Make sure you discuss any questions you have with your health care provider. Document Released: 12/30/2007 Document Revised: 12/19/2015 Document Reviewed: 06/03/2015 Elsevier Interactive Patient Education  2018 [employer].   Allergic Conjunctivitis A clear membrane (conjunctiva) covers the white part of your eye and the inner surface of your eyelid. Allergic conjunctivitis happens when this membrane has inflammation. This is caused by allergies. Common causes of allergic reactions (allergens)include:  Outdoor allergens, such as: ? Pollen. ? Grass and weeds. ? Mold spores.  Indoor allergens, such as: ? Dust. ? Smoke. ? Mold. ? Pet dander. ? Animal hair.  This condition can make your eye red or pink. It can also make your eye feel itchy. This condition cannot be spread from one person to another person (is not contagious). Follow these instructions at home:  Try not to be around things that you are allergic to.  Take or apply over-the-counter and prescription medicines only as told by your doctor. These include any eye drops.  Place a cool, clean washcloth on your eye for 10-20 minutes. Do this 3-4 times a day.  Do not touch or rub your eyes.  Do not wear contact lenses until the inflammation  is gone. Wear glasses instead.  Do not wear eye makeup until the inflammation is gone.  Keep all follow-up visits as told by your doctor. This is important. Contact a doctor if:  Your symptoms get worse.  Your symptoms do not get better with treatment.  You have mild eye pain.  You are sensitive to light,  You have spots or blisters on your eyes.  You have pus coming from your eye.  You have a fever. Get help right away if:  You have redness, swelling, or other symptoms in only one eye.  Your vision is blurry.  You have vision  changes.  You have very bad eye pain. Summary  Allergic conjunctivitis is caused by allergies. It can make your eye red or pink, and it can make your eye feel itchy.  This condition cannot be spread from one person to another person (is not contagious).  Try not to be around things that you are allergic to.  Take or apply over-the-counter and prescription medicines only as told by your doctor. These include any eye drops.  Contact your doctor if your symptoms get worse or they do not get better with treatment. This information is not intended to replace advice given to you by your health care provider. Make sure you discuss any questions you have with your health care provider. Document Released: 12/31/2009 Document Revised: 03/06/2016 Document Reviewed: 03/06/2016 Elsevier Interactive Patient Education  2017 [employer].

## 2017-06-09 LAB — URINE CULTURE

## 2017-07-01 ENCOUNTER — Ambulatory Visit: Payer: Medicare HMO | Admitting: Nurse Practitioner

## 2017-07-26 ENCOUNTER — Encounter: Payer: Medicare HMO | Admitting: *Deleted

## 2017-08-02 ENCOUNTER — Encounter: Payer: Self-pay | Admitting: Nurse Practitioner

## 2017-08-02 DIAGNOSIS — Z1231 Encounter for screening mammogram for malignant neoplasm of breast: Secondary | ICD-10-CM | POA: Diagnosis not present

## 2017-09-23 ENCOUNTER — Encounter: Payer: Self-pay | Admitting: Nurse Practitioner

## 2017-09-23 ENCOUNTER — Ambulatory Visit (INDEPENDENT_AMBULATORY_CARE_PROVIDER_SITE_OTHER): Payer: Medicare HMO | Admitting: Nurse Practitioner

## 2017-09-23 VITALS — BP 131/77 | HR 57 | Temp 96.7°F | Ht 65.0 in | Wt 177.0 lb

## 2017-09-23 DIAGNOSIS — E785 Hyperlipidemia, unspecified: Secondary | ICD-10-CM | POA: Diagnosis not present

## 2017-09-23 DIAGNOSIS — K219 Gastro-esophageal reflux disease without esophagitis: Secondary | ICD-10-CM

## 2017-09-23 DIAGNOSIS — G2581 Restless legs syndrome: Secondary | ICD-10-CM

## 2017-09-23 DIAGNOSIS — I10 Essential (primary) hypertension: Secondary | ICD-10-CM | POA: Diagnosis not present

## 2017-09-23 DIAGNOSIS — N898 Other specified noninflammatory disorders of vagina: Secondary | ICD-10-CM

## 2017-09-23 DIAGNOSIS — Z6829 Body mass index (BMI) 29.0-29.9, adult: Secondary | ICD-10-CM

## 2017-09-23 MED ORDER — PRAMIPEXOLE DIHYDROCHLORIDE 0.25 MG PO TABS: ORAL_TABLET | ORAL | 1 refills | Status: DC

## 2017-09-23 MED ORDER — OMEPRAZOLE 40 MG PO CPDR: DELAYED_RELEASE_CAPSULE | ORAL | 1 refills | Status: DC

## 2017-09-23 MED ORDER — METOPROLOL SUCCINATE ER 25 MG PO TB24: 25.0000 mg | ORAL_TABLET | Freq: Every day | ORAL | 1 refills | Status: DC

## 2017-09-23 MED ORDER — SIMVASTATIN 40 MG PO TABS: ORAL_TABLET | ORAL | 1 refills | Status: DC

## 2017-09-23 NOTE — Patient Instructions (Signed)

## 2017-09-23 NOTE — Progress Notes (Signed)
Subjective:    Patient ID: Amanda Park, female    DOB: November 07, 1945, 72 y.o.   MRN: [id number]  HPI   Amanda Park is here today for follow up of chronic medical problem.  Outpatient Encounter Medications as of 09/23/2017  Medication Sig  . aspirin EC 81 MG tablet Take 81 mg by mouth daily.  . cetirizine (ZYRTEC) 10 MG tablet Take 1 tablet (10 mg total) by mouth daily.  . metoprolol succinate (TOPROL-XL) 25 MG 24 hr tablet Take 1 tablet (25 mg total) by mouth daily.  Marland Kitchen olopatadine (PATANOL) 0.1 % ophthalmic solution Place 1 drop 2 (two) times daily into both eyes. (if needed for allergic conjunctivitis)  . omeprazole (PRILOSEC) 40 MG capsule TAKE 1 CAPSULE EVERY DAY  . pramipexole (MIRAPEX) 0.25 MG tablet TAKE ONE QHS  . simvastatin (ZOCOR) 40 MG tablet TAKE ONE DAILY    1. Essential hypertension, benign  Patient denies chest pain, sob or headaches. Checks blood pressure occasionally and is always below 546 systolic.  BP Readings from Last 3 Encounters:  06/07/17 (!) 150/87  04/12/17 (!) 141/86  03/30/17 135/77     2. Gastroesophageal reflux disease without esophagitis  Takes omeprazole daily  3. BMI 29.0-29.9,adult  No recent weight changes  4. Hyperlipidemia with target LDL less than 100  Watches diet most of the time she says  5. RLS (restless legs syndrome)  mirapex is really helping - is able to sleep better and not feel like she is running a race all night.    New complaints: She has had a vaginal discharge that is foul smelling for over a month. She was told her has a "hernia" down there and it feels like it is hanging out.  Social history: Lives with husband   Review of Systems  Constitutional: Negative for activity change and appetite change.  HENT: Negative.   Eyes: Negative for pain.  Respiratory: Negative for shortness of breath.   Cardiovascular: Negative for chest pain, palpitations and leg swelling.  Gastrointestinal: Negative for abdominal pain.    Endocrine: Negative for polydipsia.  Genitourinary: Positive for vaginal discharge. Negative for dysuria, frequency, urgency and vaginal pain.  Skin: Negative for rash.  Neurological: Negative for dizziness, weakness and headaches.  Hematological: Does not bruise/bleed easily.  Psychiatric/Behavioral: Negative.   All other systems reviewed and are negative.      Objective:   Physical Exam  Constitutional: She is oriented to person, place, and time. She appears well-developed and well-nourished.  HENT:  Nose: Nose normal.  Mouth/Throat: Oropharynx is clear and moist.  Eyes: EOM are normal.  Neck: Trachea normal, normal range of motion and full passive range of motion without pain. Neck supple. No JVD present. Carotid bruit is not present. No thyromegaly present.  Cardiovascular: Normal rate, regular rhythm, normal heart sounds and intact distal pulses. Exam reveals no gallop and no friction rub.  No murmur heard. Pulmonary/Chest: Effort normal and breath sounds normal.  Abdominal: Soft. Bowel sounds are normal. She exhibits no distension and no mass. There is no tenderness.  Genitourinary:  Genitourinary Comments: No vaginal discharge Large rectocele Vaginal cuff intact  Musculoskeletal: Normal range of motion.  Lymphadenopathy:    She has no cervical adenopathy.  Neurological: She is alert and oriented to person, place, and time. She has normal reflexes.  Skin: Skin is warm and dry.  Psychiatric: She has a normal mood and affect. Her behavior is normal. Judgment and thought content normal.   BP 131/77  Pulse (!) 57   Temp (!) 96.7 F (35.9 C) (Oral)   Ht _0  (1.651 m)   Wt 177 lb (80.3 kg)   BMI 29.45 kg/m         Assessment & Plan:  1. Essential hypertension, benign Low sodium diet - metoprolol succinate (TOPROL-XL) 25 MG 24 hr tablet; Take 1 tablet (25 mg total) by mouth daily.  Dispense: 90 tablet; Refill: 1 - [id number]+EGFR  2. Gastroesophageal reflux disease  without esophagitis Avoid spicy foods Do not eat 2 hours prior to bedtime - omeprazole (PRILOSEC) 40 MG capsule; TAKE 1 CAPSULE EVERY DAY  Dispense: 90 capsule; Refill: 1  3. BMI 29.0-29.9,adult Discussed diet and exercise for person with BMI >25 Will recheck weight in 3-6 months  4. Hyperlipidemia with target LDL less than 100 Low fat diet - simvastatin (ZOCOR) 40 MG tablet; TAKE ONE DAILY  Dispense: 90 tablet; Refill: 1 - Lipid panel  5. RLS (restless legs syndrome) Keep legs warm at noght - pramipexole (MIRAPEX) 0.25 MG tablet; TAKE ONE QHS  Dispense: 90 tablet; Refill: 1  6. Vaginal discharge No vaginal  discharge noted - WET PREP FOR [place], YEAST, CLUE    Labs pending Health maintenance reviewed Diet and exercise encouraged Continue all meds Follow up  In 3 months   [place], FNP

## 2017-09-24 LAB — LIPID PANEL
CHOLESTEROL TOTAL: 144 mg/dL (ref 100–199)
Chol/HDL Ratio: 3.4 ratio (ref 0.0–4.4)
HDL: 42 mg/dL (ref >39)
LDL CALC: 82 mg/dL (ref 0–99)
TRIGLYCERIDES: 102 mg/dL (ref 0–149)
VLDL Cholesterol Cal: 20 mg/dL (ref 5–40)

## 2017-09-24 LAB — CMP14+EGFR
ALBUMIN: 4.3 g/dL (ref 3.5–4.8)
ALK PHOS: 57 IU/L (ref 39–117)
ALT: 22 IU/L (ref 0–32)
AST: 16 IU/L (ref 0–40)
Albumin/Globulin Ratio: 2 (ref 1.2–2.2)
BILIRUBIN TOTAL: 0.4 mg/dL (ref 0.0–1.2)
BUN / CREAT RATIO: 18 (ref 12–28)
BUN: 16 mg/dL (ref 8–27)
CHLORIDE: 111 mmol/L — AB (ref 96–106)
CO2: 20 mmol/L (ref 20–29)
CREATININE: 0.9 mg/dL (ref 0.57–1.00)
Calcium: 8.9 mg/dL (ref 8.7–10.3)
GFR calc Af Amer: 74 mL/min/{1.73_m2} (ref >59)
GFR calc non Af Amer: 64 mL/min/{1.73_m2} (ref >59)
GLOBULIN, TOTAL: 2.1 g/dL (ref 1.5–4.5)
GLUCOSE: 97 mg/dL (ref 65–99)
Potassium: 4.3 mmol/L (ref 3.5–5.2)
SODIUM: 147 mmol/L — AB (ref 134–144)
Total Protein: 6.4 g/dL (ref 6.0–8.5)

## 2017-09-30 ENCOUNTER — Ambulatory Visit: Payer: Medicare HMO | Admitting: Nurse Practitioner

## 2017-10-06 ENCOUNTER — Telehealth: Payer: Self-pay | Admitting: Nurse Practitioner

## 2017-10-20 NOTE — Telephone Encounter (Signed)
Refer to lab note °

## 2018-02-02 ENCOUNTER — Ambulatory Visit (INDEPENDENT_AMBULATORY_CARE_PROVIDER_SITE_OTHER): Payer: Medicare HMO | Admitting: Family Medicine

## 2018-02-02 ENCOUNTER — Encounter: Payer: Self-pay | Admitting: Family Medicine

## 2018-02-02 ENCOUNTER — Other Ambulatory Visit: Payer: Self-pay | Admitting: Family Medicine

## 2018-02-02 VITALS — BP 119/62 | HR 62 | Temp 96.8°F | Ht 65.0 in | Wt 174.0 lb

## 2018-02-02 DIAGNOSIS — M545 Low back pain, unspecified: Secondary | ICD-10-CM

## 2018-02-02 DIAGNOSIS — N898 Other specified noninflammatory disorders of vagina: Secondary | ICD-10-CM | POA: Diagnosis not present

## 2018-02-02 DIAGNOSIS — R300 Dysuria: Secondary | ICD-10-CM

## 2018-02-02 DIAGNOSIS — G8929 Other chronic pain: Secondary | ICD-10-CM

## 2018-02-02 LAB — MICROSCOPIC EXAMINATION: RENAL EPITHEL UA: NONE SEEN /HPF

## 2018-02-02 LAB — URINALYSIS, COMPLETE
Bilirubin, UA: NEGATIVE
Glucose, UA: NEGATIVE
Ketones, UA: NEGATIVE
NITRITE UA: NEGATIVE
PH UA: 5 (ref 5.0–7.5)
RBC UA: NEGATIVE
Specific Gravity, UA: >1.03 — ABNORMAL HIGH (ref 1.005–1.030)
UUROB: 0.2 mg/dL (ref 0.2–1.0)

## 2018-02-02 MED ORDER — CEPHALEXIN 500 MG PO CAPS: 500.0000 mg | ORAL_CAPSULE | Freq: Two times a day (BID) | ORAL | 0 refills | Status: AC

## 2018-02-02 MED ORDER — PHENAZOPYRIDINE HCL 200 MG PO TABS: 200.0000 mg | ORAL_TABLET | Freq: Three times a day (TID) | ORAL | 0 refills | Status: DC | PRN

## 2018-02-02 MED ORDER — METRONIDAZOLE 0.75 % VA GEL: 1.0000 | Freq: Two times a day (BID) | VAGINAL | 0 refills | Status: AC

## 2018-02-02 MED ORDER — FLUCONAZOLE 150 MG PO TABS: 150.0000 mg | ORAL_TABLET | Freq: Once | ORAL | 0 refills | Status: AC

## 2018-02-02 NOTE — Patient Instructions (Signed)
Urinalysis demonstrated white blood cells.  This may be related to urinary tract infection or it may be related to the vaginal concern that you had today.  I have prescribed you metronidazole vaginal gel to apply twice daily for the next 5 days.  If this is bacterial vaginosis, it should improve.  I have also prescribed you cephalexin to take twice a day for the next 5 days for possible urinary tract infection.  Pyridium has been sent to your pharmacy to use up to 3 times daily if needed for burning with urination.   Bacterial Vaginosis Bacterial vaginosis is an infection of the vagina. It happens when too many germs (bacteria) grow in the vagina. This infection puts you at risk for infections from sex (STIs). Treating this infection can lower your risk for some STIs. You should also treat this if you are pregnant. It can cause your baby to be born early. Follow these instructions at home: Medicines  Take over-the-counter and prescription medicines only as told by your doctor.  Take or use your antibiotic medicine as told by your doctor. Do not stop taking or using it even if you start to feel better. General instructions  If you your sexual partner is a woman, tell her that you have this infection. She needs to get treatment if she has symptoms. If you have a female partner, he does not need to be treated.  During treatment: ? Avoid sex. ? Do not douche. ? Avoid alcohol as told. ? Avoid breastfeeding as told.  Drink enough fluid to keep your pee (urine) clear or pale yellow.  Keep your vagina and butt (rectum) clean. ? Wash the area with warm water every day. ? Wipe from front to back after you use the toilet.  Keep all follow-up visits as told by your doctor. This is important. Preventing this condition  Do not douche.  Use only warm water to wash around your vagina.  Use protection when you have sex. This includes: ? Latex condoms. ? Dental dams.  Limit how many people you have  sex with. It is best to only have sex with the same person (be monogamous).  Get tested for STIs. Have your partner get tested.  Wear underwear that is cotton or lined with cotton.  Avoid tight pants and pantyhose. This is most important in summer.  Do not use any products that have nicotine or tobacco in them. These include cigarettes and e-cigarettes. If you need help quitting, ask your doctor.  Do not use illegal drugs.  Limit how much alcohol you drink. Contact a doctor if:  Your symptoms do not get better, even after you are treated.  You have more discharge or pain when you pee (urinate).  You have a fever.  You have pain in your belly (abdomen).  You have pain with sex.  Your bleed from your vagina between periods. Summary  This infection happens when too many germs (bacteria) grow in the vagina.  Treating this condition can lower your risk for some infections from sex (STIs).  You should also treat this if you are pregnant. It can cause early (premature) birth.  Do not stop taking or using your antibiotic medicine even if you start to feel better. This information is not intended to replace advice given to you by your health care provider. Make sure you discuss any questions you have with your health care provider. Document Released: 04/21/2008 Document Revised: 03/28/2016 Document Reviewed: 03/28/2016 Elsevier Interactive Patient Education  2017 [employer]  Urinary Tract Infection, Adult A urinary tract infection (UTI) is an infection of any part of the urinary tract. The urinary tract includes the:  Kidneys.  Ureters.  Bladder.  Urethra.  These organs make, store, and get rid of pee (urine) in the body. Follow these instructions at home:  Take over-the-counter and prescription medicines only as told by your doctor.  If you were prescribed an antibiotic medicine, take it as told by your doctor. Do not stop taking the antibiotic even if you start to  feel better.  Avoid the following drinks: ? Alcohol. ? Caffeine. ? Tea. ? Carbonated drinks.  Drink enough fluid to keep your pee clear or pale yellow.  Keep all follow-up visits as told by your doctor. This is important.  Make sure to: ? Empty your bladder often and completely. Do not to hold pee for long periods of time. ? Empty your bladder before and after sex. ? Wipe from front to back after a bowel movement if you are female. Use each tissue one time when you wipe. Contact a doctor if:  You have back pain.  You have a fever.  You feel sick to your stomach (nauseous).  You throw up (vomit).  Your symptoms do not get better after 3 days.  Your symptoms go away and then come back. Get help right away if:  You have very bad back pain.  You have very bad lower belly (abdominal) pain.  You are throwing up and cannot keep down any medicines or water. This information is not intended to replace advice given to you by your health care provider. Make sure you discuss any questions you have with your health care provider. Document Released: 12/30/2007 Document Revised: 12/19/2015 Document Reviewed: 06/03/2015 Elsevier Interactive Patient Education  [employer].

## 2018-02-02 NOTE — Progress Notes (Addendum)
Subjective: CC: UTI PCP: [name], FNP PYP:[id number] Boulos is a 72 y.o. female presenting to clinic today for:  1. Urinary symptoms/ abnormal vaginal discharge Patient reports a 1 day h/o dysuria, hematuria .  Denies urinary frequency, urgency, fevers, chills, abdominal pain, vomiting.  She reports that she has had intermittent low back pain for "a while now".  She also reports yellow, thin, fishy smelling vaginal discharge that is been ongoing since her last checkup with her PCP.  She notes that she was checked for infection but was told that her work-up was normal.  She denies any vaginal itching, recent coitus.  She is hydrating well.  Patient has used nothing for symptoms.  Patient denies a h/o frequent or recurrent UTIs.  She was treated for urinary tract infection 06/07/2017.  Her urine culture at that time grew pansensitive E. Coli.    ROS: Per HPI  No Known Allergies Past Medical History:  Diagnosis Date  . Allergy   . Hyperlipidemia   . Hypertension   . Osteopenia   . PVC (premature ventricular contraction)     Current Outpatient Medications:  .  aspirin EC 81 MG tablet, Take 81 mg by mouth daily., Disp: , Rfl:  .  cetirizine (ZYRTEC) 10 MG tablet, Take 1 tablet (10 mg total) by mouth daily., Disp: 90 tablet, Rfl: 1 .  metoprolol succinate (TOPROL-XL) 25 MG 24 hr tablet, Take 1 tablet (25 mg total) by mouth daily., Disp: 90 tablet, Rfl: 1 .  olopatadine (PATANOL) 0.1 % ophthalmic solution, Place 1 drop 2 (two) times daily into both eyes. (if needed for allergic conjunctivitis), Disp: 5 mL, Rfl: 1 .  omeprazole (PRILOSEC) 40 MG capsule, TAKE 1 CAPSULE EVERY DAY, Disp: 90 capsule, Rfl: 1 .  pramipexole (MIRAPEX) 0.25 MG tablet, TAKE ONE QHS, Disp: 90 tablet, Rfl: 1 .  simvastatin (ZOCOR) 40 MG tablet, TAKE ONE DAILY, Disp: 90 tablet, Rfl: 1 Social History   Socioeconomic History  . Marital status: Married    Spouse name: Not on file  . Number of children: Not  on file  . Years of education: Not on file  . Highest education level: Not on file  Occupational History  . Not on file  Social Needs  . Financial resource strain: Not on file  . Food insecurity:    Worry: Not on file    Inability: Not on file  . Transportation needs:    Medical: Not on file    Non-medical: Not on file  Tobacco Use  . Smoking status: Never Smoker  . Smokeless tobacco: Never Used  Substance and Sexual Activity  . Alcohol use: No    Alcohol/week: 0.0 oz  . Drug use: No  . Sexual activity: Not on file  Lifestyle  . Physical activity:    Days per week: Not on file    Minutes per session: Not on file  . Stress: Not on file  Relationships  . Social connections:    Talks on phone: Not on file    Gets together: Not on file    Attends religious service: Not on file    Active member of club or organization: Not on file    Attends meetings of clubs or organizations: Not on file    Relationship status: Not on file  . Intimate partner violence:    Fear of current or ex partner: Not on file    Emotionally abused: Not on file    Physically abused: Not on file  Forced sexual activity: Not on file  Other Topics Concern  . Not on file  Social History Narrative  . Not on file   Family History  Problem Relation Age of Onset  . Cancer Mother 39       cancer  . Hypertension Mother   . Heart disease Paternal Grandfather   . Prostate cancer Father   . Prostate cancer Brother   . Stomach cancer Paternal Uncle   . Colon cancer Neg Hx     Objective: Office vital signs reviewed. BP 119/62   Pulse 62   Temp (!) 96.8 F (36 C) (Oral)   Ht 5\' 5"  (1.651 m)   Wt 174 lb (78.9 kg)   BMI 28.96 kg/m   Physical Examination:  General: Awake, alert, well nourished, nontoxic, No acute distress GU: vaginal exam was declined by patient.  She had no suprapubic TTP. MSK: normal gait and normal station; No CVA tenderness to palpation.  Lumbar spine: Patient has tenderness  to palpation along the entire lumbosacral junction.  Skin: dry; intact; no rashes or lesions  Assessment/ Plan: 72 y.o. female   1. Dysuria Urinalysis with trace proteins and 1+ leukocytes.  Urine microscopy with 11-30 white blood cells, few bacteria and yeast present.  Given symptoms, will empirically treat with Keflex 500 mg p.o. twice daily for the next 5 days.  Pyridium sent into pharmacy to use 3 times daily as needed dysuria.  Home care instructions were reviewed.  Reasons for return discussed.  Follow-up as needed. - Urinalysis, Complete  2. Vaginal discharge I offered her a vaginal exam versus self swab but she declined both.  Yeast as above.  Diflucan 150 mill grams p.o. x1 sent.  Her symptoms seem consistent with bacterial vaginosis.  For this reason, I have prescribed her empiric treatment with topical metronidazole vaginally for the next 5 days.  Follow-up with PCP as scheduled  3. Vaginal odor As above  4. Chronic bilateral low back pain without sciatica I do not think that this is pyelonephritis.  This is more likely musculoskeletal in nature.  Follow-up with PCP as needed.   Orders Placed This Encounter  Procedures  . Urinalysis, Complete   Meds ordered this encounter  Medications  . cephALEXin (KEFLEX) 500 MG capsule    Sig: Take 1 capsule (500 mg total) by mouth 2 (two) times daily for 5 days.    Dispense:  10 capsule    Refill:  0  . metroNIDAZOLE (METROGEL VAGINAL) 0.75 % vaginal gel    Sig: Place 1 Applicatorful vaginally 2 (two) times daily for 5 days.    Dispense:  70 g    Refill:  0  . phenazopyridine (PYRIDIUM) 200 MG tablet    Sig: Take 1 tablet (200 mg total) by mouth 3 (three) times daily as needed for pain.    Dispense:  10 tablet    Refill:  0     Shyheim Tanney Windell Moulding, DO [place] [phone]

## 2018-03-31 ENCOUNTER — Encounter: Payer: Medicare HMO | Admitting: Nurse Practitioner

## 2018-04-04 DIAGNOSIS — D485 Neoplasm of uncertain behavior of skin: Secondary | ICD-10-CM | POA: Diagnosis not present

## 2018-04-04 DIAGNOSIS — L570 Actinic keratosis: Secondary | ICD-10-CM | POA: Diagnosis not present

## 2018-04-04 DIAGNOSIS — L814 Other melanin hyperpigmentation: Secondary | ICD-10-CM | POA: Diagnosis not present

## 2018-04-04 DIAGNOSIS — D2239 Melanocytic nevi of other parts of face: Secondary | ICD-10-CM | POA: Diagnosis not present

## 2018-04-04 DIAGNOSIS — B079 Viral wart, unspecified: Secondary | ICD-10-CM | POA: Diagnosis not present

## 2018-04-04 DIAGNOSIS — L579 Skin changes due to chronic exposure to nonionizing radiation, unspecified: Secondary | ICD-10-CM | POA: Diagnosis not present

## 2018-04-04 DIAGNOSIS — L821 Other seborrheic keratosis: Secondary | ICD-10-CM | POA: Diagnosis not present

## 2018-04-06 DIAGNOSIS — L570 Actinic keratosis: Secondary | ICD-10-CM | POA: Diagnosis not present

## 2018-04-08 DIAGNOSIS — H524 Presbyopia: Secondary | ICD-10-CM | POA: Diagnosis not present

## 2018-04-14 ENCOUNTER — Ambulatory Visit (INDEPENDENT_AMBULATORY_CARE_PROVIDER_SITE_OTHER): Payer: Medicare HMO

## 2018-04-14 ENCOUNTER — Encounter: Payer: Self-pay | Admitting: Nurse Practitioner

## 2018-04-14 ENCOUNTER — Ambulatory Visit (INDEPENDENT_AMBULATORY_CARE_PROVIDER_SITE_OTHER): Payer: Medicare HMO | Admitting: Nurse Practitioner

## 2018-04-14 VITALS — BP 130/66 | HR 53 | Temp 97.0°F | Ht 65.0 in | Wt 174.0 lb

## 2018-04-14 DIAGNOSIS — I10 Essential (primary) hypertension: Secondary | ICD-10-CM

## 2018-04-14 DIAGNOSIS — Z6829 Body mass index (BMI) 29.0-29.9, adult: Secondary | ICD-10-CM

## 2018-04-14 DIAGNOSIS — E785 Hyperlipidemia, unspecified: Secondary | ICD-10-CM

## 2018-04-14 DIAGNOSIS — K219 Gastro-esophageal reflux disease without esophagitis: Secondary | ICD-10-CM

## 2018-04-14 DIAGNOSIS — G2581 Restless legs syndrome: Secondary | ICD-10-CM

## 2018-04-14 DIAGNOSIS — Z0000 Encounter for general adult medical examination without abnormal findings: Secondary | ICD-10-CM | POA: Diagnosis not present

## 2018-04-14 MED ORDER — PRAMIPEXOLE DIHYDROCHLORIDE 0.25 MG PO TABS: ORAL_TABLET | ORAL | 1 refills | Status: DC

## 2018-04-14 MED ORDER — OMEPRAZOLE 40 MG PO CPDR: DELAYED_RELEASE_CAPSULE | ORAL | 1 refills | Status: DC

## 2018-04-14 MED ORDER — SIMVASTATIN 40 MG PO TABS: ORAL_TABLET | ORAL | 1 refills | Status: DC

## 2018-04-14 MED ORDER — METOPROLOL SUCCINATE ER 25 MG PO TB24: 25.0000 mg | ORAL_TABLET | Freq: Every day | ORAL | 1 refills | Status: DC

## 2018-04-14 NOTE — Progress Notes (Signed)
Subjective:    Patient ID: Amanda Park, female    DOB: May 02, 1946, 72 y.o.   MRN: [id number]   Chief Complaint:  Patient presents for annual wellness exam  HPI:   1. Essential hypertension, benign  -does not check at home -takes metoprolol for frequent PVCs prescribed by cardiologist at [facility] (has not been back in years) -no CP/HA/blurred vision   2. Gastroesophageal reflux disease without esophagitis  -no current symptoms -still takes Prilosec   3. Hyperlipidemia with target LDL less than 100  -Zocor, no myalgias, just restless leg  -tries to stick to low fat diet   4. BMI 29.0-29.9,adult  -tries to exercise, walks in house and goes up and down steps   5. RLS (restless legs syndrome)  -mirapex helps, takes every night     Outpatient Encounter Medications as of 04/14/2018  Medication Sig  . aspirin EC 81 MG tablet Take 81 mg by mouth daily.  . cetirizine (ZYRTEC) 10 MG tablet Take 1 tablet (10 mg total) by mouth daily.  . metoprolol succinate (TOPROL-XL) 25 MG 24 hr tablet Take 1 tablet (25 mg total) by mouth daily.  Marland Kitchen omeprazole (PRILOSEC) 40 MG capsule TAKE 1 CAPSULE EVERY DAY  . pramipexole (MIRAPEX) 0.25 MG tablet TAKE ONE QHS  . simvastatin (ZOCOR) 40 MG tablet TAKE ONE DAILY  . [DISCONTINUED] olopatadine (PATANOL) 0.1 % ophthalmic solution Place 1 drop 2 (two) times daily into both eyes. (if needed for allergic conjunctivitis)  . [DISCONTINUED] phenazopyridine (PYRIDIUM) 200 MG tablet Take 1 tablet (200 mg total) by mouth 3 (three) times daily as needed for pain.   No facility-administered encounter medications on file as of 04/14/2018.     New complaints: No new complaints  Social history: Married, retired   Review of Systems  Constitutional: Negative for activity change and appetite change.  HENT: Negative for ear pain and facial swelling.   Eyes: Negative for discharge and itching.  Respiratory: Negative for cough and shortness of breath.     Cardiovascular: Negative for chest pain and leg swelling.  Gastrointestinal: Negative for abdominal pain, blood in stool and constipation.  Endocrine: Negative for cold intolerance and heat intolerance.  Genitourinary: Negative for difficulty urinating and dysuria.  Musculoskeletal: Negative for arthralgias and back pain.  Skin: Negative for rash and wound.  Allergic/Immunologic: Positive for environmental allergies (takes Zyrtec).  Neurological: Negative for dizziness and headaches.  Hematological: Does not bruise/bleed easily.  Psychiatric/Behavioral: Negative for agitation. The patient is not nervous/anxious.        Objective:   Physical Exam  Constitutional: She is oriented to person, place, and time. She appears well-developed and well-nourished.  HENT:  Head: Normocephalic and atraumatic.    Right Ear: Hearing and external ear normal. No tenderness. No decreased hearing is noted.  Left Ear: Hearing, tympanic membrane, external ear and ear canal normal.  Nose: Nose normal.  Mouth/Throat: Oropharynx is clear and moist.  Impacted cerumen in right ear canal Precancerous lesion on face dermatologist biopsied  Eyes: Conjunctivae and EOM are normal.  Neck: Trachea normal, normal range of motion and full passive range of motion without pain. Neck supple. No thyromegaly present.  Cardiovascular: Normal rate, regular rhythm, normal heart sounds and intact distal pulses.  Pulmonary/Chest: Effort normal and breath sounds normal.  Abdominal: Soft. Bowel sounds are normal.  Genitourinary: Vagina normal. No vaginal discharge found.  Genitourinary Comments: Vaginal cuff intact No adnexal masses or tenderness  Musculoskeletal: Normal range of motion.  Neurological: She is  alert and oriented to person, place, and time.  Skin: Skin is warm and dry.  Psychiatric: She has a normal mood and affect. Her behavior is normal. Judgment and thought content normal.   EKG- sinus bradycardia  Chest  x ray- deferred  To radiologist    BP 130/66   Pulse (!) 53   Temp (!) 97 F (36.1 C) (Oral)   Ht 5' 5"  (1.651 m)   Wt 174 lb (78.9 kg)   BMI 28.96 kg/m   Assessment & Plan:  Sadeen Wiegel comes in today with chief complaint of Annual phyiscal exam  Diagnosis and orders addressed:  1. Annual physical exam - CBC with Differential/Platelet - Thyroid Panel With TSH  2. Essential hypertension, benign - CMP14+EGFR - DG Chest 2 View; Future - EKG 12-Lead - metoprolol succinate (TOPROL-XL) 25 MG 24 hr tablet; Take 1 tablet (25 mg total) by mouth daily.  Dispense: 90 tablet; Refill: 1 -continue low salt diet -encouraged her to check BP at home  3. Gastroesophageal reflux disease without esophagitis - omeprazole (PRILOSEC) 40 MG capsule; TAKE 1 CAPSULE EVERY DAY  Dispense: 90 capsule; Refill: 1 -avoid spicy foods -small frequent meals; avoid eating 2-3 hours before bed  4. Hyperlipidemia with target LDL less than 100 - Lipid panel - simvastatin (ZOCOR) 40 MG tablet; TAKE ONE DAILY  Dispense: 90 tablet; Refill: 1 -encouraged low fat diet  5. BMI 29.0-29.9,adult -exercise counseling  6. RLS (restless legs syndrome) - pramipexole (MIRAPEX) 0.25 MG tablet; TAKE ONE QHS  Dispense: 90 tablet; Refill: 1   Labs pending Health Maintenance reviewed Diet and exercise encouraged  Follow up plan: 6 months   Mary-Margaret Hassell Done, FNP

## 2018-04-14 NOTE — Patient Instructions (Signed)
Hypertension Hypertension is another name for high blood pressure. High blood pressure forces your heart to work harder to pump blood. This can cause problems over time. There are two numbers in a blood pressure reading. There is a top number (systolic) over a bottom number (diastolic). It is best to have a blood pressure below 120/80. Healthy choices can help lower your blood pressure. You may need medicine to help lower your blood pressure if:  Your blood pressure cannot be lowered with healthy choices.  Your blood pressure is higher than 130/80.  Follow these instructions at home: Eating and drinking  If directed, follow the DASH eating plan. This diet includes: ? Filling half of your plate at each meal with fruits and vegetables. ? Filling one quarter of your plate at each meal with whole grains. Whole grains include whole wheat pasta, brown rice, and whole grain bread. ? Eating or drinking low-fat dairy products, such as skim milk or low-fat yogurt. ? Filling one quarter of your plate at each meal with low-fat (lean) proteins. Low-fat proteins include fish, skinless chicken, eggs, beans, and tofu. ? Avoiding fatty meat, cured and processed meat, or chicken with skin. ? Avoiding premade or processed food.  Eat less than 1,500 mg of salt (sodium) a day.  Limit alcohol use to no more than 1 drink a day for nonpregnant women and 2 drinks a day for men. One drink equals 12 oz of beer, 5 oz of wine, or 1 oz of hard liquor. Lifestyle  Work with your doctor to stay at a healthy weight or to lose weight. Ask your doctor what the best weight is for you.  Get at least 30 minutes of exercise that causes your heart to beat faster (aerobic exercise) most days of the week. This may include walking, swimming, or biking.  Get at least 30 minutes of exercise that strengthens your muscles (resistance exercise) at least 3 days a week. This may include lifting weights or pilates.  Do not use any  products that contain nicotine or tobacco. This includes cigarettes and e-cigarettes. If you need help quitting, ask your doctor.  Check your blood pressure at home as told by your doctor.  Keep all follow-up visits as told by your doctor. This is important. Medicines  Take over-the-counter and prescription medicines only as told by your doctor. Follow directions carefully.  Do not skip doses of blood pressure medicine. The medicine does not work as well if you skip doses. Skipping doses also puts you at risk for problems.  Ask your doctor about side effects or reactions to medicines that you should watch for. Contact a doctor if:  You think you are having a reaction to the medicine you are taking.  You have headaches that keep coming back (recurring).  You feel dizzy.  You have swelling in your ankles.  You have trouble with your vision. Get help right away if:  You get a very bad headache.  You start to feel confused.  You feel weak or numb.  You feel faint.  You get very bad pain in your: ? Chest. ? Belly (abdomen).  You throw up (vomit) more than once.  You have trouble breathing. Summary  Hypertension is another name for high blood pressure.  Making healthy choices can help lower blood pressure. If your blood pressure cannot be controlled with healthy choices, you may need to take medicine. This information is not intended to replace advice given to you by your health care  provider. Make sure you discuss any questions you have with your health care provider. Document Released: 12/30/2007 Document Revised: 06/10/2016 Document Reviewed: 06/10/2016 Elsevier Interactive Patient Education  2018 [place]. Restless Legs Syndrome Restless legs syndrome is a condition that causes uncomfortable feelings or sensations in the legs, especially while sitting or lying down. The sensations usually cause an overwhelming urge to move the legs. The arms can also sometimes be  affected. The condition can range from mild to severe. The symptoms often interfere with a person's ability to sleep. What are the causes? The cause of this condition is not known. What increases the risk? This condition is more likely to develop in:  People who are older than age 27.  Pregnant women. In general, restless legs syndrome is more common in women than in men.  People who have a family history of the condition.  People who have certain medical conditions, such as iron deficiency, kidney disease, Parkinson disease, or nerve damage.  People who take certain medicines, such as medicines for high blood pressure, nausea, colds, allergies, depression, and some heart conditions.  What are the signs or symptoms? The main symptom of this condition is uncomfortable sensations in the legs. These sensations may be:  Described as pulling, tingling, prickling, throbbing, crawling, or burning.  Worse while you are sitting or lying down.  Worse during periods of rest or inactivity.  Worse at night, often interfering with your sleep.  Accompanied by a very strong urge to move your legs.  Temporarily relieved by movement of your legs.  The sensations usually affect both sides of the body. The arms can also be affected, but this is rare. People who have this condition often have tiredness during the day because of their lack of sleep at night. How is this diagnosed? This condition may be diagnosed based on your description of the symptoms. You may also have tests, including blood tests, to check for other conditions that may lead to your symptoms. In some cases, you may be asked to spend some time in a sleep lab so your sleeping can be monitored. How is this treated? Treatment for this condition is focused on managing the symptoms. Treatment may include:  Self-help and lifestyle changes.  Medicines.  Follow these instructions at home:  Take medicines only as directed by your  health care provider.  Try these methods to get temporary relief from the uncomfortable sensations: ? Massage your legs. ? Walk or stretch. ? Take a cold or hot bath.  Practice good sleep habits. For example, go to bed and get up at the same time every day.  Exercise regularly.  Practice ways of relaxing, such as yoga or meditation.  Avoid caffeine and alcohol.  Do not use any tobacco products, including cigarettes, chewing tobacco, or electronic cigarettes. If you need help quitting, ask your health care provider.  Keep all follow-up visits as directed by your health care provider. This is important. Contact a health care provider if: Your symptoms do not improve with treatment, or they get worse. This information is not intended to replace advice given to you by your health care provider. Make sure you discuss any questions you have with your health care provider. Document Released: 07/03/2002 Document Revised: 12/19/2015 Document Reviewed: 07/09/2014 Elsevier Interactive Patient Education  [employer].

## 2018-04-14 NOTE — Addendum Note (Signed)
Addended by: Rolena Infante on: 04/14/2018 04:59 PM   Modules accepted: Orders

## 2018-04-15 LAB — URINALYSIS, COMPLETE
Bilirubin, UA: NEGATIVE
Glucose, UA: NEGATIVE
Ketones, UA: NEGATIVE
Nitrite, UA: NEGATIVE
Protein, UA: NEGATIVE
RBC UA: NEGATIVE
SPEC GRAV UA: 1.02 (ref 1.005–1.030)
Urobilinogen, Ur: 0.2 mg/dL (ref 0.2–1.0)
pH, UA: 5 (ref 5.0–7.5)

## 2018-04-15 LAB — CBC WITH DIFFERENTIAL/PLATELET
BASOS: 1 %
Basophils Absolute: 0 10*3/uL (ref 0.0–0.2)
EOS (ABSOLUTE): 0.1 10*3/uL (ref 0.0–0.4)
Eos: 1 %
Hematocrit: 39.7 % (ref 34.0–46.6)
Hemoglobin: 13.1 g/dL (ref 11.1–15.9)
IMMATURE GRANS (ABS): 0 10*3/uL (ref 0.0–0.1)
IMMATURE GRANULOCYTES: 0 %
LYMPHS: 32 %
Lymphocytes Absolute: 1.9 10*3/uL (ref 0.7–3.1)
MCH: 28.6 pg (ref 26.6–33.0)
MCHC: 33 g/dL (ref 31.5–35.7)
MCV: 87 fL (ref 79–97)
Monocytes Absolute: 0.4 10*3/uL (ref 0.1–0.9)
Monocytes: 6 %
NEUTROS PCT: 60 %
Neutrophils Absolute: 3.6 10*3/uL (ref 1.4–7.0)
PLATELETS: 192 10*3/uL (ref 150–450)
RBC: 4.58 x10E6/uL (ref 3.77–5.28)
RDW: 13.1 % (ref 12.3–15.4)
WBC: 6 10*3/uL (ref 3.4–10.8)

## 2018-04-15 LAB — LIPID PANEL
CHOLESTEROL TOTAL: 135 mg/dL (ref 100–199)
Chol/HDL Ratio: 3 ratio (ref 0.0–4.4)
HDL: 45 mg/dL (ref >39)
LDL Calculated: 71 mg/dL (ref 0–99)
TRIGLYCERIDES: 97 mg/dL (ref 0–149)
VLDL CHOLESTEROL CAL: 19 mg/dL (ref 5–40)

## 2018-04-15 LAB — THYROID PANEL WITH TSH
Free Thyroxine Index: 2.6 (ref 1.2–4.9)
T3 Uptake Ratio: 28 % (ref 24–39)
T4, Total: 9.4 ug/dL (ref 4.5–12.0)
TSH: 1.85 u[IU]/mL (ref 0.450–4.500)

## 2018-04-15 LAB — CMP14+EGFR
A/G RATIO: 2.2 (ref 1.2–2.2)
ALK PHOS: 58 IU/L (ref 39–117)
ALT: 15 IU/L (ref 0–32)
AST: 14 IU/L (ref 0–40)
Albumin: 4.7 g/dL (ref 3.5–4.8)
BILIRUBIN TOTAL: 0.5 mg/dL (ref 0.0–1.2)
BUN/Creatinine Ratio: 19 (ref 12–28)
BUN: 17 mg/dL (ref 8–27)
CALCIUM: 9.2 mg/dL (ref 8.7–10.3)
CHLORIDE: 106 mmol/L (ref 96–106)
CO2: 20 mmol/L (ref 20–29)
Creatinine, Ser: 0.9 mg/dL (ref 0.57–1.00)
GFR calc non Af Amer: 64 mL/min/{1.73_m2} (ref >59)
GFR, EST AFRICAN AMERICAN: 73 mL/min/{1.73_m2} (ref >59)
GLUCOSE: 85 mg/dL (ref 65–99)
Globulin, Total: 2.1 g/dL (ref 1.5–4.5)
Potassium: 4.4 mmol/L (ref 3.5–5.2)
Sodium: 143 mmol/L (ref 134–144)
TOTAL PROTEIN: 6.8 g/dL (ref 6.0–8.5)

## 2018-04-15 LAB — MICROSCOPIC EXAMINATION
Bacteria, UA: NONE SEEN
RBC MICROSCOPIC, UA: NONE SEEN /HPF (ref 0–2)

## 2018-04-22 ENCOUNTER — Ambulatory Visit (INDEPENDENT_AMBULATORY_CARE_PROVIDER_SITE_OTHER): Payer: Medicare HMO

## 2018-04-22 DIAGNOSIS — Z23 Encounter for immunization: Secondary | ICD-10-CM | POA: Diagnosis not present

## 2018-05-17 DIAGNOSIS — L570 Actinic keratosis: Secondary | ICD-10-CM | POA: Diagnosis not present

## 2018-05-17 DIAGNOSIS — B079 Viral wart, unspecified: Secondary | ICD-10-CM | POA: Diagnosis not present

## 2018-08-24 DIAGNOSIS — Z1231 Encounter for screening mammogram for malignant neoplasm of breast: Secondary | ICD-10-CM | POA: Diagnosis not present

## 2018-08-24 LAB — HM MAMMOGRAPHY

## 2018-10-13 ENCOUNTER — Ambulatory Visit (INDEPENDENT_AMBULATORY_CARE_PROVIDER_SITE_OTHER): Payer: Medicare HMO | Admitting: Nurse Practitioner

## 2018-10-13 ENCOUNTER — Encounter: Payer: Self-pay | Admitting: Nurse Practitioner

## 2018-10-13 ENCOUNTER — Other Ambulatory Visit: Payer: Self-pay

## 2018-10-13 VITALS — BP 140/69 | HR 68 | Temp 97.1°F | Ht 65.0 in | Wt 179.0 lb

## 2018-10-13 DIAGNOSIS — E785 Hyperlipidemia, unspecified: Secondary | ICD-10-CM

## 2018-10-13 DIAGNOSIS — N3000 Acute cystitis without hematuria: Secondary | ICD-10-CM

## 2018-10-13 DIAGNOSIS — K219 Gastro-esophageal reflux disease without esophagitis: Secondary | ICD-10-CM | POA: Diagnosis not present

## 2018-10-13 DIAGNOSIS — R300 Dysuria: Secondary | ICD-10-CM | POA: Diagnosis not present

## 2018-10-13 DIAGNOSIS — B3731 Acute candidiasis of vulva and vagina: Secondary | ICD-10-CM

## 2018-10-13 DIAGNOSIS — B373 Candidiasis of vulva and vagina: Secondary | ICD-10-CM | POA: Diagnosis not present

## 2018-10-13 DIAGNOSIS — G2581 Restless legs syndrome: Secondary | ICD-10-CM | POA: Diagnosis not present

## 2018-10-13 DIAGNOSIS — Z6829 Body mass index (BMI) 29.0-29.9, adult: Secondary | ICD-10-CM | POA: Diagnosis not present

## 2018-10-13 DIAGNOSIS — I10 Essential (primary) hypertension: Secondary | ICD-10-CM

## 2018-10-13 LAB — CMP14+EGFR
ALT: 24 IU/L (ref 0–32)
AST: 25 IU/L (ref 0–40)
Albumin/Globulin Ratio: 2 (ref 1.2–2.2)
Albumin: 4.3 g/dL (ref 3.7–4.7)
Alkaline Phosphatase: 67 IU/L (ref 39–117)
BUN/Creatinine Ratio: 14 (ref 12–28)
BUN: 14 mg/dL (ref 8–27)
Bilirubin Total: 0.5 mg/dL (ref 0.0–1.2)
CALCIUM: 9.2 mg/dL (ref 8.7–10.3)
CO2: 21 mmol/L (ref 20–29)
Chloride: 109 mmol/L — ABNORMAL HIGH (ref 96–106)
Creatinine, Ser: 1.01 mg/dL — ABNORMAL HIGH (ref 0.57–1.00)
GFR calc Af Amer: 64 mL/min/{1.73_m2} (ref >59)
GFR calc non Af Amer: 55 mL/min/{1.73_m2} — ABNORMAL LOW (ref >59)
Globulin, Total: 2.1 g/dL (ref 1.5–4.5)
Glucose: 101 mg/dL — ABNORMAL HIGH (ref 65–99)
Potassium: 4.4 mmol/L (ref 3.5–5.2)
Sodium: 142 mmol/L (ref 134–144)
Total Protein: 6.4 g/dL (ref 6.0–8.5)

## 2018-10-13 LAB — URINALYSIS, COMPLETE
Bilirubin, UA: NEGATIVE
GLUCOSE, UA: NEGATIVE
Ketones, UA: NEGATIVE
Nitrite, UA: NEGATIVE
PROTEIN UA: NEGATIVE
RBC, UA: NEGATIVE
Specific Gravity, UA: 1.025 (ref 1.005–1.030)
Urobilinogen, Ur: 0.2 mg/dL (ref 0.2–1.0)
pH, UA: 5 (ref 5.0–7.5)

## 2018-10-13 LAB — LIPID PANEL
Chol/HDL Ratio: 3.6 ratio (ref 0.0–4.4)
Cholesterol, Total: 174 mg/dL (ref 100–199)
HDL: 49 mg/dL (ref >39)
LDL Calculated: 103 mg/dL — ABNORMAL HIGH (ref 0–99)
Triglycerides: 110 mg/dL (ref 0–149)
VLDL Cholesterol Cal: 22 mg/dL (ref 5–40)

## 2018-10-13 LAB — MICROSCOPIC EXAMINATION

## 2018-10-13 MED ORDER — FLUCONAZOLE 150 MG PO TABS: 150.0000 mg | ORAL_TABLET | Freq: Once | ORAL | 0 refills | Status: AC

## 2018-10-13 MED ORDER — PRAMIPEXOLE DIHYDROCHLORIDE 0.25 MG PO TABS: ORAL_TABLET | ORAL | 1 refills | Status: DC

## 2018-10-13 MED ORDER — FLUCONAZOLE 150 MG PO TABS: 150.0000 mg | ORAL_TABLET | Freq: Once | ORAL | 0 refills | Status: DC

## 2018-10-13 MED ORDER — CIPROFLOXACIN HCL 500 MG PO TABS: 500.0000 mg | ORAL_TABLET | Freq: Two times a day (BID) | ORAL | 0 refills | Status: DC

## 2018-10-13 MED ORDER — SIMVASTATIN 40 MG PO TABS: ORAL_TABLET | ORAL | 1 refills | Status: DC

## 2018-10-13 MED ORDER — METOPROLOL SUCCINATE ER 25 MG PO TB24: 25.0000 mg | ORAL_TABLET | Freq: Every day | ORAL | 1 refills | Status: DC

## 2018-10-13 MED ORDER — OMEPRAZOLE 40 MG PO CPDR: DELAYED_RELEASE_CAPSULE | ORAL | 1 refills | Status: DC

## 2018-10-13 NOTE — Progress Notes (Signed)
Subjective:    Patient ID: Amanda Park, female    DOB: 19-Dec-1945, 73 y.o.   MRN: [id number]   Chief Complaint: Medical Management of Chronic Issues (dysuria and vaginal itching and tick bite)   HPI:  1. Dysuria  Started with dysuria and vaginal itching 2-3 days ago  2. Essential hypertension, benign  no c/o chest pan, sob or headache. Does not check blood pressure at home. BP Readings from Last 3 Encounters:  10/13/18 140/69  04/14/18 130/66  02/02/18 119/62     3. Hyperlipidemia with target LDL less than 100  Does not watch diet and does very little exercise.  4. Gastroesophageal reflux disease without esophagitis  Is on omeprazole daily  5. RLS (restless legs syndrome)  mirapex nightly which works well for her  6. BMI 29.0-29.9,adult  No recent weight changes    Outpatient Encounter Medications as of 10/13/2018  Medication Sig  . aspirin EC 81 MG tablet Take 81 mg by mouth daily.  . cetirizine (ZYRTEC) 10 MG tablet Take 1 tablet (10 mg total) by mouth daily.  . Melatonin 5 MG CAPS Take by mouth.  . metoprolol succinate (TOPROL-XL) 25 MG 24 hr tablet Take 1 tablet (25 mg total) by mouth daily.  Marland Kitchen omeprazole (PRILOSEC) 40 MG capsule TAKE 1 CAPSULE EVERY DAY  . pramipexole (MIRAPEX) 0.25 MG tablet TAKE ONE QHS  . simvastatin (ZOCOR) 40 MG tablet TAKE ONE DAILY       New complaints: None today  Social history: Lives with her husband who is pretty healthy   Review of Systems  Constitutional: Negative for activity change and appetite change.  HENT: Negative.   Eyes: Negative for pain.  Respiratory: Negative for shortness of breath.   Cardiovascular: Negative for chest pain, palpitations and leg swelling.  Gastrointestinal: Negative for abdominal pain.  Endocrine: Negative for polydipsia.  Genitourinary: Positive for dysuria and vaginal discharge.  Skin: Negative for rash.  Neurological: Negative for dizziness, weakness and headaches.  Hematological: Does  not bruise/bleed easily.  Psychiatric/Behavioral: Negative.   All other systems reviewed and are negative.      Objective:   Physical Exam Vitals signs and nursing note reviewed.  Constitutional:      General: She is not in acute distress.    Appearance: Normal appearance. She is well-developed.  HENT:     Head: Normocephalic.     Nose: Nose normal.  Eyes:     Pupils: Pupils are equal, round, and reactive to light.  Neck:     Musculoskeletal: Normal range of motion and neck supple.     Vascular: No carotid bruit or JVD.  Cardiovascular:     Rate and Rhythm: Normal rate and regular rhythm.     Heart sounds: Normal heart sounds.  Pulmonary:     Effort: Pulmonary effort is normal. No respiratory distress.     Breath sounds: Normal breath sounds. No wheezing or rales.  Chest:     Chest wall: No tenderness.  Abdominal:     General: Bowel sounds are normal. There is no distension or abdominal bruit.     Palpations: Abdomen is soft. There is no hepatomegaly, splenomegaly, mass or pulsatile mass.     Tenderness: There is no abdominal tenderness.  Musculoskeletal: Normal range of motion.  Lymphadenopathy:     Cervical: No cervical adenopathy.  Skin:    General: Skin is warm and dry.     Comments: 1cm erythematous macular area on left lower flank from tick removal  Neurological:     Mental Status: She is alert and oriented to person, place, and time.     Deep Tendon Reflexes: Reflexes are normal and symmetric.  Psychiatric:        Behavior: Behavior normal.        Thought Content: Thought content normal.        Judgment: Judgment normal.    BP 140/69   Pulse 68   Temp (!) 97.1 F (36.2 C) (Oral)   Ht 5' 5"  (1.651 m)   Wt 179 lb (81.2 kg)   BMI 29.79 kg/m         Assessment & Plan:  Amanda Park comes in today with chief complaint of Medical Management of Chronic Issues (dysuria and vaginal itching and tick bite)   Diagnosis and orders addressed:  1. Dysuria -  Urinalysis, Complete  2. Essential hypertension, benign Low sodium diet - metoprolol succinate (TOPROL-XL) 25 MG 24 hr tablet; Take 1 tablet (25 mg total) by mouth daily.  Dispense: 90 tablet; Refill: 1 - CMP14+EGFR  3. Hyperlipidemia with target LDL less than 100 Low fat diet - simvastatin (ZOCOR) 40 MG tablet; TAKE ONE DAILY  Dispense: 90 tablet; Refill: 1 - Lipid panel  4. Gastroesophageal reflux disease without esophagitis Avoid spicy foods Do not eat 2 hours prior to bedtime - omeprazole (PRILOSEC) 40 MG capsule; TAKE 1 CAPSULE EVERY DAY  Dispense: 90 capsule; Refill: 1  5. RLS (restless legs syndrome) Keep legs warm at night - pramipexole (MIRAPEX) 0.25 MG tablet; TAKE ONE QHS  Dispense: 90 tablet; Refill: 1  6. BMI 29.0-29.9,adult Discussed diet and exercise for person with BMI >25 Will recheck weight in 3-6 months   7. Acute cystitis without hematuria Take medication as prescribe Cotton underwear Take shower not bath Cranberry juice, yogurt Force fluids AZO over the counter X2 days Culture pending RTO prn  - ciprofloxacin (CIPRO) 500 MG tablet; Take 1 tablet (500 mg total) by mouth 2 (two) times daily.  Dispense: 10 tablet; Refill: 0  8. Vaginal candidiasis - fluconazole (DIFLUCAN) 150 MG tablet; Take 1 tablet (150 mg total) by mouth once for 1 dose.  Dispense: 1 tablet; Refill: 0   Labs pending Health Maintenance reviewed Diet and exercise encouraged  Follow up plan: 6 months   Mary-Margaret Hassell Done, FNP

## 2018-10-13 NOTE — Addendum Note (Signed)
Addended by: [name] on: 10/13/2018 10:55 AM   Modules accepted: Orders

## 2018-10-13 NOTE — Patient Instructions (Signed)
Vaginal Yeast infection, Adult    Vaginal yeast infection is a condition that causes vaginal discharge as well as soreness, swelling, and redness (inflammation) of the vagina. This is a common condition. Some women get this infection frequently.  What are the causes?  This condition is caused by a change in the normal balance of the yeast (candida) and bacteria that live in the vagina. This change causes an overgrowth of yeast, which causes the inflammation.  What increases the risk?  The condition is more likely to develop in women who:   Take antibiotic medicines.   Have diabetes.   Take birth control pills.   Are pregnant.   Douche often.   Have a weak body defense system (immune system).   Have been taking steroid medicines for a long time.   Frequently wear tight clothing.  What are the signs or symptoms?  Symptoms of this condition include:   White, thick, creamy vaginal discharge.   Swelling, itching, redness, and irritation of the vagina. The lips of the vagina (vulva) may be affected as well.   Pain or a burning feeling while urinating.   Pain during sex.  How is this diagnosed?  This condition is diagnosed based on:   Your medical history.   A physical exam.   A pelvic exam. Your health care provider will examine a sample of your vaginal discharge under a microscope. Your health care provider may send this sample for testing to confirm the diagnosis.  How is this treated?  This condition is treated with medicine. Medicines may be over-the-counter or prescription. You may be told to use one or more of the following:   Medicine that is taken by mouth (orally).   Medicine that is applied as a cream (topically).   Medicine that is inserted directly into the vagina (suppository).  Follow these instructions at home:    Lifestyle   Do not have sex until your health care provider approves. Tell your sex partner that you have a yeast infection. That person should go to his or her health care  provider and ask if they should also be treated.   Do not wear tight clothes, such as pantyhose or tight pants.   Wear breathable cotton underwear.  General instructions   Take or apply over-the-counter and prescription medicines only as told by your health care provider.   Eat more yogurt. This may help to keep your yeast infection from returning.   Do not use tampons until your health care provider approves.   Try taking a sitz bath to help with discomfort. This is a warm water bath that is taken while you are sitting down. The water should only come up to your hips and should cover your buttocks. Do this 3-4 times per day or as told by your health care provider.   Do not douche.   If you have diabetes, keep your blood sugar levels under control.   Keep all follow-up visits as told by your health care provider. This is important.  Contact a health care provider if:   You have a fever.   Your symptoms go away and then return.   Your symptoms do not get better with treatment.   Your symptoms get worse.   You have new symptoms.   You develop blisters in or around your vagina.   You have blood coming from your vagina and it is not your menstrual period.   You develop pain in your abdomen.  Summary     Vaginal yeast infection is a condition that causes discharge as well as soreness, swelling, and redness (inflammation) of the vagina.   This condition is treated with medicine. Medicines may be over-the-counter or prescription.   Take or apply over-the-counter and prescription medicines only as told by your health care provider.   Do not douche. Do not have sex or use tampons until your health care provider approves.   Contact a health care provider if your symptoms do not get better with treatment or your symptoms go away and then return.  This information is not intended to replace advice given to you by your health care provider. Make sure you discuss any questions you have with your health care  provider.  Document Released: 04/22/2005 Document Revised: 11/29/2017 Document Reviewed: 11/29/2017  Elsevier Interactive Patient Education  2019 Elsevier Inc.

## 2018-10-15 LAB — URINE CULTURE

## 2018-12-27 ENCOUNTER — Telehealth: Payer: Self-pay | Admitting: Nurse Practitioner

## 2019-02-23 ENCOUNTER — Telehealth: Payer: Self-pay | Admitting: Nurse Practitioner

## 2019-02-23 NOTE — Chronic Care Management (AMB) (Signed)
°  Chronic Care Management   Outreach Note  02/23/2019 Name: Amanda Park MRN: [id number] DOB: [birth year]  Referred by: [name], FNP Reason for referral : No chief complaint on file.   Third unsuccessful telephone outreach was attempted today. The patient was referred to the case management team for assistance with chronic care management and care coordination. The patient's primary care provider has been notified of our unsuccessful attempts to make or maintain contact with the patient. The care management team is pleased to engage with this patient at any time in the future should he/she be interested in assistance from the care management team.   Follow Up Plan: The care management team is available to follow up with the patient after provider conversation with the patient regarding recommendation for care management engagement and subsequent re-referral to the care management team.   [place]  [email or web address]   ??[phone]

## 2019-03-29 DIAGNOSIS — Z20828 Contact with and (suspected) exposure to other viral communicable diseases: Secondary | ICD-10-CM | POA: Diagnosis not present

## 2019-04-08 ENCOUNTER — Other Ambulatory Visit: Payer: Self-pay | Admitting: Nurse Practitioner

## 2019-04-08 DIAGNOSIS — I10 Essential (primary) hypertension: Secondary | ICD-10-CM

## 2019-04-08 DIAGNOSIS — K219 Gastro-esophageal reflux disease without esophagitis: Secondary | ICD-10-CM

## 2019-04-08 DIAGNOSIS — G2581 Restless legs syndrome: Secondary | ICD-10-CM

## 2019-04-17 ENCOUNTER — Encounter: Payer: Medicare HMO | Admitting: Nurse Practitioner

## 2019-04-24 ENCOUNTER — Encounter: Payer: Medicare HMO | Admitting: Nurse Practitioner

## 2019-04-25 ENCOUNTER — Other Ambulatory Visit: Payer: Self-pay

## 2019-04-26 ENCOUNTER — Ambulatory Visit (INDEPENDENT_AMBULATORY_CARE_PROVIDER_SITE_OTHER): Payer: Medicare HMO | Admitting: Nurse Practitioner

## 2019-04-26 ENCOUNTER — Encounter: Payer: Self-pay | Admitting: Nurse Practitioner

## 2019-04-26 VITALS — BP 138/84 | HR 60 | Temp 97.1°F | Resp 20 | Ht 65.0 in | Wt 178.0 lb

## 2019-04-26 DIAGNOSIS — Z6829 Body mass index (BMI) 29.0-29.9, adult: Secondary | ICD-10-CM

## 2019-04-26 DIAGNOSIS — Z23 Encounter for immunization: Secondary | ICD-10-CM

## 2019-04-26 DIAGNOSIS — G2581 Restless legs syndrome: Secondary | ICD-10-CM

## 2019-04-26 DIAGNOSIS — K219 Gastro-esophageal reflux disease without esophagitis: Secondary | ICD-10-CM | POA: Diagnosis not present

## 2019-04-26 DIAGNOSIS — I10 Essential (primary) hypertension: Secondary | ICD-10-CM

## 2019-04-26 DIAGNOSIS — E785 Hyperlipidemia, unspecified: Secondary | ICD-10-CM | POA: Diagnosis not present

## 2019-04-26 MED ORDER — OMEPRAZOLE 40 MG PO CPDR: 40.0000 mg | DELAYED_RELEASE_CAPSULE | Freq: Every day | ORAL | 1 refills | Status: DC

## 2019-04-26 MED ORDER — SIMVASTATIN 40 MG PO TABS: ORAL_TABLET | ORAL | 1 refills | Status: DC

## 2019-04-26 MED ORDER — PRAMIPEXOLE DIHYDROCHLORIDE 0.25 MG PO TABS: ORAL_TABLET | ORAL | 1 refills | Status: DC

## 2019-04-26 MED ORDER — METOPROLOL SUCCINATE ER 25 MG PO TB24: 25.0000 mg | ORAL_TABLET | Freq: Every day | ORAL | 1 refills | Status: DC

## 2019-04-26 NOTE — Addendum Note (Signed)
Addended by: Rolena Infante on: 04/26/2019 04:19 PM   Modules accepted: Orders

## 2019-04-26 NOTE — Patient Instructions (Signed)
Exercising to Stay Healthy To become healthy and stay healthy, it is recommended that you do moderate-intensity and vigorous-intensity exercise. You can tell that you are exercising at a moderate intensity if your heart starts beating faster and you start breathing faster but can still hold a conversation. You can tell that you are exercising at a vigorous intensity if you are breathing much harder and faster and cannot hold a conversation while exercising. Exercising regularly is important. It has many health benefits, such as:  Improving overall fitness, flexibility, and endurance.  Increasing bone density.  Helping with weight control.  Decreasing body fat.  Increasing muscle strength.  Reducing stress and tension.  Improving overall health. How often should I exercise? Choose an activity that you enjoy, and set realistic goals. Your health care provider can help you make an activity plan that works for you. Exercise regularly as told by your health care provider. This may include:  Doing strength training two times a week, such as: ? Lifting weights. ? Using resistance bands. ? Push-ups. ? Sit-ups. ? Yoga.  Doing a certain intensity of exercise for a given amount of time. Choose from these options: ? A total of 150 minutes of moderate-intensity exercise every week. ? A total of 75 minutes of vigorous-intensity exercise every week. ? A mix of moderate-intensity and vigorous-intensity exercise every week. Children, pregnant women, people who have not exercised regularly, people who are overweight, and older adults may need to talk with a health care provider about what activities are safe to do. If you have a medical condition, be sure to talk with your health care provider before you start a new exercise program. What are some exercise ideas? Moderate-intensity exercise ideas include:  Walking 1 mile (1.6 km) in about 15 minutes.  Biking.  Hiking.  Golfing.  Dancing.   Water aerobics. Vigorous-intensity exercise ideas include:  Walking 4.5 miles (7.2 km) or more in about 1 hour.  Jogging or running 5 miles (8 km) in about 1 hour.  Biking 10 miles (16.1 km) or more in about 1 hour.  Lap swimming.  Roller-skating or in-line skating.  Cross-country skiing.  Vigorous competitive sports, such as football, basketball, and soccer.  Jumping rope.  Aerobic dancing. What are some everyday activities that can help me to get exercise?  Yard work, such as: ? Pushing a lawn mower. ? Raking and bagging leaves.  Washing your car.  Pushing a stroller.  Shoveling snow.  Gardening.  Washing windows or floors. How can I be more active in my day-to-day activities?  Use stairs instead of an elevator.  Take a walk during your lunch break.  If you drive, park your car farther away from your work or school.  If you take public transportation, get off one stop early and walk the rest of the way.  Stand up or walk around during all of your indoor phone calls.  Get up, stretch, and walk around every 30 minutes throughout the day.  Enjoy exercise with a friend. Support to continue exercising will help you keep a regular routine of activity. What guidelines can I follow while exercising?  Before you start a new exercise program, talk with your health care provider.  Do not exercise so much that you hurt yourself, feel dizzy, or get very short of breath.  Wear comfortable clothes and wear shoes with good support.  Drink plenty of water while you exercise to prevent dehydration or heat stroke.  Work out until your breathing   and your heartbeat get faster. Where to find more information  [place] Department of Health and Human Services: www.hhs.gov  Centers for Disease Control and Prevention (CDC): www.cdc.gov Summary  Exercising regularly is important. It will improve your overall fitness, flexibility, and endurance.  Regular exercise also will  improve your overall health. It can help you control your weight, reduce stress, and improve your bone density.  Do not exercise so much that you hurt yourself, feel dizzy, or get very short of breath.  Before you start a new exercise program, talk with your health care provider. This information is not intended to replace advice given to you by your health care provider. Make sure you discuss any questions you have with your health care provider. Document Released: 08/15/2010 Document Revised: 06/25/2017 Document Reviewed: 06/03/2017 Elsevier Patient Education  2020 Elsevier Inc.  

## 2019-04-26 NOTE — Progress Notes (Signed)
Subjective:    Patient ID: Amanda Park, female    DOB: [birth year], 73 y.o.   MRN: [id number]   Chief Complaint: Medical Management of Chronic Issues    HPI:  1. Essential hypertension, benign No c/o chest pain, sob or headache. Does not check blood pressure at home. BP Readings from Last 3 Encounters:  04/26/19 (!) 152/76  10/13/18 140/69  04/14/18 130/66     2. Hyperlipidemia with target LDL less than 100 Tries to watch diet but does very little exercise. Lab Results  Component Value Date   CHOL 174 10/13/2018   HDL 49 10/13/2018   LDLCALC 103 (H) 10/13/2018   TRIG 110 10/13/2018   CHOLHDL 3.6 10/13/2018     3. RLS (restless legs syndrome) Is on mirapex daily and works well to keep legs still at night so she can sleep  4. Gastroesophageal reflux disease without esophagitis Is on daily omeprazole and is doing well.  5. BMI 29.0-29.9,adult No recent weight changes    Outpatient Encounter Medications as of 04/26/2019  Medication Sig  . aspirin EC 81 MG tablet Take 81 mg by mouth daily.  . cetirizine (ZYRTEC) 10 MG tablet Take 1 tablet (10 mg total) by mouth daily.  . ciprofloxacin (CIPRO) 500 MG tablet Take 1 tablet (500 mg total) by mouth 2 (two) times daily.  . Melatonin 5 MG CAPS Take by mouth.  . metoprolol succinate (TOPROL-XL) 25 MG 24 hr tablet TAKE 1 TABLET (25 MG TOTAL) BY MOUTH DAILY.  Marland Kitchen omeprazole (PRILOSEC) 40 MG capsule TAKE 1 CAPSULE EVERY DAY  . pramipexole (MIRAPEX) 0.25 MG tablet TAKE 1 TABLET AT BEDTIME  . simvastatin (ZOCOR) 40 MG tablet TAKE ONE DAILY    Past Surgical History:  Procedure Laterality Date  . ABDOMINAL HYSTERECTOMY    . cyst removed from left arm pit  03/15/2017  . FOOT SURGERY    . TMJ ARTHROPLASTY      Family History  Problem Relation Age of Onset  . Cancer Mother 71       cancer  . Hypertension Mother   . Heart disease Paternal Grandfather   . Prostate cancer Father   . Prostate cancer Brother   . Stomach  cancer Paternal Uncle   . Colon cancer Neg Hx     New complaints: None today  Social history: Lives with her husband  Controlled substance contract: n/a    Review of Systems  Constitutional: Negative for activity change and appetite change.  HENT: Negative.   Eyes: Negative for pain.  Respiratory: Negative for shortness of breath.   Cardiovascular: Negative for chest pain, palpitations and leg swelling.  Gastrointestinal: Negative for abdominal pain.  Endocrine: Negative for polydipsia.  Genitourinary: Negative.   Skin: Negative for rash.  Neurological: Negative for dizziness, weakness and headaches.  Hematological: Does not bruise/bleed easily.  Psychiatric/Behavioral: Negative.   All other systems reviewed and are negative.      Objective:   Physical Exam Vitals signs and nursing note reviewed.  Constitutional:      General: She is not in acute distress.    Appearance: Normal appearance. She is well-developed.  HENT:     Head: Normocephalic.     Nose: Nose normal.  Eyes:     Pupils: Pupils are equal, round, and reactive to light.  Neck:     Musculoskeletal: Normal range of motion and neck supple.     Vascular: No carotid bruit or JVD.  Cardiovascular:     Rate  and Rhythm: Normal rate and regular rhythm.     Heart sounds: Normal heart sounds.  Pulmonary:     Effort: Pulmonary effort is normal. No respiratory distress.     Breath sounds: Normal breath sounds. No wheezing or rales.  Chest:     Chest wall: No tenderness.  Abdominal:     General: Bowel sounds are normal. There is no distension or abdominal bruit.     Palpations: Abdomen is soft. There is no hepatomegaly, splenomegaly, mass or pulsatile mass.     Tenderness: There is no abdominal tenderness.  Musculoskeletal: Normal range of motion.  Lymphadenopathy:     Cervical: No cervical adenopathy.  Skin:    General: Skin is warm and dry.  Neurological:     Mental Status: She is alert and oriented to  person, place, and time.     Deep Tendon Reflexes: Reflexes are normal and symmetric.  Psychiatric:        Behavior: Behavior normal.        Thought Content: Thought content normal.        Judgment: Judgment normal.    BP 138/84 (BP Location: Left Arm)   Pulse 60   Temp (!) 97.1 F (36.2 C) (Oral)   Resp 20   Ht 5' 5"  (1.651 m)   Wt 178 lb (80.7 kg)   SpO2 100%   BMI 29.62 kg/m          Assessment & Plan:  Shantavia Jha comes in today with chief complaint of Medical Management of Chronic Issues   Diagnosis and orders addressed:  1. Essential hypertension, benign Low sodium diet - CMP14+EGFR - metoprolol succinate (TOPROL-XL) 25 MG 24 hr tablet; Take 1 tablet (25 mg total) by mouth daily.  Dispense: 90 tablet; Refill: 1  2. Hyperlipidemia with target LDL less than 100 Low fat diet - Lipid panel - simvastatin (ZOCOR) 40 MG tablet; TAKE ONE DAILY  Dispense: 90 tablet; Refill: 1  3. RLS (restless legs syndrome) Keep legs warm at night - pramipexole (MIRAPEX) 0.25 MG tablet; TAKE 1 TABLET AT BEDTIME  Dispense: 90 tablet; Refill: 1  4. Gastroesophageal reflux disease without esophagitis Avoid spicy foods Do not eat 2 hours prior to bedtime - omeprazole (PRILOSEC) 40 MG capsule; Take 1 capsule (40 mg total) by mouth daily.  Dispense: 90 capsule; Refill: 1  5. BMI 29.0-29.9,adult Discussed diet and exercise for person with BMI >25 Will recheck weight in 3-6 months    Labs pending Health Maintenance reviewed Diet and exercise encouraged  Follow up plan: 6 months   Mary-Margaret Hassell Done, FNP

## 2019-04-27 LAB — CMP14+EGFR
ALT: 27 IU/L (ref 0–32)
AST: 21 IU/L (ref 0–40)
Albumin/Globulin Ratio: 2 (ref 1.2–2.2)
Albumin: 4.5 g/dL (ref 3.7–4.7)
Alkaline Phosphatase: 75 IU/L (ref 39–117)
BUN/Creatinine Ratio: 13 (ref 12–28)
BUN: 14 mg/dL (ref 8–27)
Bilirubin Total: 0.6 mg/dL (ref 0.0–1.2)
CO2: 20 mmol/L (ref 20–29)
Calcium: 9.5 mg/dL (ref 8.7–10.3)
Chloride: 105 mmol/L (ref 96–106)
Creatinine, Ser: 1.06 mg/dL — ABNORMAL HIGH (ref 0.57–1.00)
GFR calc Af Amer: 60 mL/min/{1.73_m2} (ref >59)
GFR calc non Af Amer: 52 mL/min/{1.73_m2} — ABNORMAL LOW (ref >59)
Globulin, Total: 2.3 g/dL (ref 1.5–4.5)
Glucose: 96 mg/dL (ref 65–99)
Potassium: 4.3 mmol/L (ref 3.5–5.2)
Sodium: 142 mmol/L (ref 134–144)
Total Protein: 6.8 g/dL (ref 6.0–8.5)

## 2019-04-27 LAB — LIPID PANEL
Chol/HDL Ratio: 3.4 ratio (ref 0.0–4.4)
Cholesterol, Total: 148 mg/dL (ref 100–199)
HDL: 43 mg/dL (ref >39)
LDL Chol Calc (NIH): 89 mg/dL (ref 0–99)
Triglycerides: 83 mg/dL (ref 0–149)
VLDL Cholesterol Cal: 16 mg/dL (ref 5–40)

## 2019-06-26 ENCOUNTER — Other Ambulatory Visit: Payer: Self-pay

## 2019-06-26 ENCOUNTER — Ambulatory Visit: Payer: Medicare HMO

## 2019-06-27 ENCOUNTER — Ambulatory Visit (INDEPENDENT_AMBULATORY_CARE_PROVIDER_SITE_OTHER): Payer: Medicare HMO

## 2019-06-27 DIAGNOSIS — Z23 Encounter for immunization: Secondary | ICD-10-CM

## 2019-08-07 ENCOUNTER — Encounter: Payer: Self-pay | Admitting: Nurse Practitioner

## 2019-08-07 ENCOUNTER — Other Ambulatory Visit: Payer: Self-pay

## 2019-08-07 ENCOUNTER — Ambulatory Visit (INDEPENDENT_AMBULATORY_CARE_PROVIDER_SITE_OTHER): Payer: Medicare HMO | Admitting: Nurse Practitioner

## 2019-08-07 VITALS — BP 175/68 | HR 62 | Temp 97.5°F | Resp 20 | Ht 65.0 in | Wt 180.0 lb

## 2019-08-07 DIAGNOSIS — M545 Low back pain, unspecified: Secondary | ICD-10-CM

## 2019-08-07 MED ORDER — METHYLPREDNISOLONE ACETATE 80 MG/ML IJ SUSP
80.0000 mg | Freq: Once | INTRAMUSCULAR | Status: AC
  Administered 2019-08-07: 15:00:00 80 mg via INTRAMUSCULAR

## 2019-08-07 MED ORDER — CYCLOBENZAPRINE HCL 5 MG PO TABS: 5.0000 mg | ORAL_TABLET | Freq: Every day | ORAL | 1 refills | Status: DC

## 2019-08-07 MED ORDER — PREDNISONE 20 MG PO TABS: ORAL_TABLET | ORAL | 0 refills | Status: DC

## 2019-08-07 NOTE — Progress Notes (Signed)
   Subjective:    Patient ID: Amanda Park, female    DOB: [birth year], 74 y.o.   MRN: [id number]   Chief Complaint: Back Pain   HPI Patient comes in c/o left lower back pain that today has radiated over to the right side. She denies any urinary symptoms. Denies back injury. Started about 3 weeks ago. Rates pain 5-10/20. Pain worsens when she gets up and moves around. Sitting sometimes makes it better. Describes pain as stabbing pain that is intermittent. Motrin has helped.  Review of Systems  Constitutional: Negative.   HENT: Negative.   Respiratory: Negative.   Cardiovascular: Negative.   Gastrointestinal: Negative.   Genitourinary: Negative for dysuria, frequency and urgency.  Musculoskeletal: Positive for back pain.  Neurological: Negative.   Psychiatric/Behavioral: Negative.   All other systems reviewed and are negative.      Objective:   Physical Exam Vitals and nursing note reviewed.  Constitutional:      Appearance: Normal appearance.  Cardiovascular:     Rate and Rhythm: Normal rate and regular rhythm.     Heart sounds: Normal heart sounds.  Pulmonary:     Breath sounds: Normal breath sounds.  Musculoskeletal:     Comments: FROM of lumbar spine without producing pain described (-) SLR bil Motor strength and sensation distally intact.  Skin:    General: Skin is warm and dry.  Neurological:     General: No focal deficit present.     Mental Status: She is alert and oriented to person, place, and time.  Psychiatric:        Mood and Affect: Mood normal.        Behavior: Behavior normal.     BP (!) 175/68   Pulse 62   Temp (!) 97.5 F (36.4 C) (Temporal)   Resp 20   Ht 5\' 5"  (1.651 m)   Wt 180 lb (81.6 kg)   SpO2 98%   BMI 29.95 kg/m        Assessment & Plan:  Amanda Park in today with chief complaint of Back Pain   1. Acute midline low back pain without sciatica Moist heat Rest rto PRN - methylPREDNISolone acetate (DEPO-MEDROL) injection 80  mg - predniSONE (DELTASONE) 20 MG tablet; 2 po at sametime daily for 5 days- start tomorrow  Dispense: 10 tablet; Refill: 0 - cyclobenzaprine (FLEXERIL) 5 MG tablet; Take 1 tablet (5 mg total) by mouth at bedtime.  Dispense: 30 tablet; Refill: [place], FNP

## 2019-08-07 NOTE — Patient Instructions (Signed)
Acute Back Pain, Adult Acute back pain is sudden and usually short-lived. It is often caused by an injury to the muscles and tissues in the back. The injury may result from:  A muscle or ligament getting overstretched or torn (strained). Ligaments are tissues that connect bones to each other. Lifting something improperly can cause a back strain.  Wear and tear (degeneration) of the spinal disks. Spinal disks are circular tissue that provides cushioning between the bones of the spine (vertebrae).  Twisting motions, such as while playing sports or doing yard work.  A hit to the back.  Arthritis. You may have a physical exam, lab tests, and imaging tests to find the cause of your pain. Acute back pain usually goes away with rest and home care. Follow these instructions at home: Managing pain, stiffness, and swelling  Take over-the-counter and prescription medicines only as told by your health care provider.  Your health care provider may recommend applying ice during the first 24-48 hours after your pain starts. To do this: ? Put ice in a plastic bag. ? Place a towel between your skin and the bag. ? Leave the ice on for 20 minutes, 2-3 times a day.  If directed, apply heat to the affected area as often as told by your health care provider. Use the heat source that your health care provider recommends, such as a moist heat pack or a heating pad. ? Place a towel between your skin and the heat source. ? Leave the heat on for 20-30 minutes. ? Remove the heat if your skin turns bright red. This is especially important if you are unable to feel pain, heat, or cold. You have a greater risk of getting burned. Activity   Do not stay in bed. Staying in bed for more than 1-2 days can delay your recovery.  Sit up and stand up straight. Avoid leaning forward when you sit, or hunching over when you stand. ? If you work at a desk, sit close to it so you do not need to lean over. Keep your chin tucked  in. Keep your neck drawn back, and keep your elbows bent at a right angle. Your arms should look like the letter "L." ? Sit high and close to the steering wheel when you drive. Add lower back (lumbar) support to your car seat, if needed.  Take short walks on even surfaces as soon as you are able. Try to increase the length of time you walk each day.  Do not sit, drive, or stand in one place for more than 30 minutes at a time. Sitting or standing for long periods of time can put stress on your back.  Do not drive or use heavy machinery while taking prescription pain medicine.  Use proper lifting techniques. When you bend and lift, use positions that put less stress on your back: ? Bend your knees. ? Keep the load close to your body. ? Avoid twisting.  Exercise regularly as told by your health care provider. Exercising helps your back heal faster and helps prevent back injuries by keeping muscles strong and flexible.  Work with a physical therapist to make a safe exercise program, as recommended by your health care provider. Do any exercises as told by your physical therapist. Lifestyle  Maintain a healthy weight. Extra weight puts stress on your back and makes it difficult to have good posture.  Avoid activities or situations that make you feel anxious or stressed. Stress and anxiety increase muscle   tension and can make back pain worse. Learn ways to manage anxiety and stress, such as through exercise. General instructions  Sleep on a firm mattress in a comfortable position. Try lying on your side with your knees slightly bent. If you lie on your back, put a pillow under your knees.  Follow your treatment plan as told by your health care provider. This may include: ? Cognitive or behavioral therapy. ? Acupuncture or massage therapy. ? Meditation or yoga. Contact a health care provider if:  You have pain that is not relieved with rest or medicine.  You have increasing pain going down  into your legs or buttocks.  Your pain does not improve after 2 weeks.  You have pain at night.  You lose weight without trying.  You have a fever or chills. Get help right away if:  You develop new bowel or bladder control problems.  You have unusual weakness or numbness in your arms or legs.  You develop nausea or vomiting.  You develop abdominal pain.  You feel faint. Summary  Acute back pain is sudden and usually short-lived.  Use proper lifting techniques. When you bend and lift, use positions that put less stress on your back.  Take over-the-counter and prescription medicines and apply heat or ice as directed by your health care provider. This information is not intended to replace advice given to you by your health care provider. Make sure you discuss any questions you have with your health care provider. Document Revised: 11/01/2018 Document Reviewed: 02/24/2017 Elsevier Patient Education  2020 Elsevier Inc.  

## 2019-09-05 DIAGNOSIS — Z1231 Encounter for screening mammogram for malignant neoplasm of breast: Secondary | ICD-10-CM | POA: Diagnosis not present

## 2019-10-09 ENCOUNTER — Telehealth: Payer: Self-pay | Admitting: Nurse Practitioner

## 2019-10-09 NOTE — Chronic Care Management (AMB) (Signed)
  Chronic Care Management   Note  10/09/2019 Name: Amanda Park MRN: [id number] DOB: Sep 02, 1945  Amanda Park is a 74 y.o. year old female who is a primary care patient of [name], [place]. I reached out to [name] by phone today in response to a referral sent by [name]'s health plan.     [name] was given information about Chronic Care Management services today including:  1. CCM service includes personalized support from designated clinical staff supervised by her physician, including individualized plan of care and coordination with other care providers 2. 24/7 contact phone numbers for assistance for urgent and routine care needs. 3. Service will only be billed when office clinical staff spend 20 minutes or more in a month to coordinate care. 4. Only one practitioner may furnish and bill the service in a calendar month. 5. The patient may stop CCM services at any time (effective at the end of the month) by phone call to the office staff. 6. The patient will be responsible for cost sharing (co-pay) of up to 20% of the service fee (after annual deductible is met).  Patient did not agree to enrollment in care management services and does not wish to consider at this time.  Follow up plan: The care management team is available to follow up with the patient after provider conversation with the patient regarding recommendation for care management engagement and subsequent re-referral to the care management team.   [place], [place], [place] [id number] Direct Dial: [email or web address] Website: [email or web address]

## 2019-10-23 ENCOUNTER — Ambulatory Visit (INDEPENDENT_AMBULATORY_CARE_PROVIDER_SITE_OTHER): Payer: Medicare HMO | Admitting: Nurse Practitioner

## 2019-10-23 ENCOUNTER — Encounter: Payer: Self-pay | Admitting: Nurse Practitioner

## 2019-10-23 DIAGNOSIS — E785 Hyperlipidemia, unspecified: Secondary | ICD-10-CM

## 2019-10-23 DIAGNOSIS — Z6829 Body mass index (BMI) 29.0-29.9, adult: Secondary | ICD-10-CM

## 2019-10-23 DIAGNOSIS — G2581 Restless legs syndrome: Secondary | ICD-10-CM

## 2019-10-23 DIAGNOSIS — K219 Gastro-esophageal reflux disease without esophagitis: Secondary | ICD-10-CM | POA: Diagnosis not present

## 2019-10-23 DIAGNOSIS — I10 Essential (primary) hypertension: Secondary | ICD-10-CM | POA: Diagnosis not present

## 2019-10-23 MED ORDER — METOPROLOL SUCCINATE ER 25 MG PO TB24: 25.0000 mg | ORAL_TABLET | Freq: Every day | ORAL | 1 refills | Status: DC

## 2019-10-23 MED ORDER — SIMVASTATIN 40 MG PO TABS: ORAL_TABLET | ORAL | 1 refills | Status: DC

## 2019-10-23 MED ORDER — PRAMIPEXOLE DIHYDROCHLORIDE 0.25 MG PO TABS: ORAL_TABLET | ORAL | 1 refills | Status: DC

## 2019-10-23 MED ORDER — OMEPRAZOLE 40 MG PO CPDR: 40.0000 mg | DELAYED_RELEASE_CAPSULE | Freq: Every day | ORAL | 1 refills | Status: DC

## 2019-10-23 NOTE — Progress Notes (Signed)
Virtual Visit via telephone Note Due to COVID-19 pandemic this visit was conducted virtually. This visit type was conducted due to national recommendations for restrictions regarding the COVID-19 Pandemic (e.g. social distancing, sheltering in place) in an effort to limit this patient's exposure and mitigate transmission in our community. All issues noted in this document were discussed and addressed.  A physical exam was not performed with this format.  I connected with Amanda Park on 10/23/19 at 9:10 by telephone and verified that I am speaking with the correct person using two identifiers. Amanda Park is currently located at home and her husband is currently with her during visit. The provider, Mary-Margaret Hassell Done, FNP is located in their office at time of visit.  I discussed the limitations, risks, security and privacy concerns of performing an evaluation and management service by telephone and the availability of in person appointments. I also discussed with the patient that there may be a patient responsible charge related to this service. The patient expressed understanding and agreed to proceed.   History and Present Illness:   Chief Complaint: Medical Management of Chronic Issues    HPI:  1. Essential hypertension, benign No c/o chest pain, SOB or headache. Does not check blood pressure at work. BP Readings from Last 3 Encounters:  08/07/19 (!) 175/68  04/26/19 138/84  10/13/18 140/69     2. Hyperlipidemia with target LDL less than 100 Does watch diet and does stay very active. Lab Results  Component Value Date   CHOL 148 04/26/2019   HDL 43 04/26/2019   LDLCALC 89 04/26/2019   TRIG 83 04/26/2019   CHOLHDL 3.4 04/26/2019      3. Gastroesophageal reflux disease without esophagitis On omperazole dialy and is doing well.  4. RLS (restless legs syndrome) Is on mirapex daily and is doing well.  5. BMI 29.0-29.9,adult No recent weight changes Wt Readings from  Last 3 Encounters:  08/07/19 180 lb (81.6 kg)  04/26/19 178 lb (80.7 kg)  10/13/18 179 lb (81.2 kg)   BMI Readings from Last 3 Encounters:  08/07/19 29.95 kg/m  04/26/19 29.62 kg/m  10/13/18 29.79 kg/m       Outpatient Encounter Medications as of 10/23/2019  Medication Sig  . aspirin EC 81 MG tablet Take 81 mg by mouth daily.  . cetirizine (ZYRTEC) 10 MG tablet Take 1 tablet (10 mg total) by mouth daily.  . cyclobenzaprine (FLEXERIL) 5 MG tablet Take 1 tablet (5 mg total) by mouth at bedtime.  . Melatonin 5 MG CAPS Take by mouth.  . metoprolol succinate (TOPROL-XL) 25 MG 24 hr tablet Take 1 tablet (25 mg total) by mouth daily.  Marland Kitchen omeprazole (PRILOSEC) 40 MG capsule Take 1 capsule (40 mg total) by mouth daily.  . pramipexole (MIRAPEX) 0.25 MG tablet TAKE 1 TABLET AT BEDTIME  . simvastatin (ZOCOR) 40 MG tablet TAKE ONE DAILY   No facility-administered encounter medications on file as of 10/23/2019.    Past Surgical History:  Procedure Laterality Date  . ABDOMINAL HYSTERECTOMY    . cyst removed from left arm pit  03/15/2017  . FOOT SURGERY    . TMJ ARTHROPLASTY      Family History  Problem Relation Age of Onset  . Cancer Mother 33       cancer  . Hypertension Mother   . Heart disease Paternal Grandfather   . Prostate cancer Father   . Prostate cancer Brother   . Stomach cancer Paternal Uncle   . Colon  cancer Neg Hx     New complaints: None today  Social history: Lives with husband  Controlled substance contract: n/a    Review of Systems  Constitutional: Negative for diaphoresis and weight loss.  Eyes: Negative for blurred vision, double vision and pain.  Respiratory: Negative for shortness of breath.   Cardiovascular: Negative for chest pain, palpitations, orthopnea and leg swelling.  Gastrointestinal: Negative for abdominal pain.  Skin: Negative for rash.  Neurological: Negative for dizziness, sensory change, loss of consciousness, weakness and  headaches.  Endo/Heme/Allergies: Negative for polydipsia. Does not bruise/bleed easily.  Psychiatric/Behavioral: Negative for memory loss. The patient does not have insomnia.   All other systems reviewed and are negative.    Observations/Objective: Alert and oriented- answers all questions appropriately No distress    Assessment and Plan: Amanda Park comes in today with chief complaint of Medical Management of Chronic Issues   Diagnosis and orders addressed:  1. Essential hypertension, benign Low sodium diet - metoprolol succinate (TOPROL-XL) 25 MG 24 hr tablet; Take 1 tablet (25 mg total) by mouth daily.  Dispense: 90 tablet; Refill: 1 - CMP14+EGFR - CBC with Differential/Platelet  2. Hyperlipidemia with target LDL less than 100 Low fat diet - simvastatin (ZOCOR) 40 MG tablet; TAKE ONE DAILY  Dispense: 90 tablet; Refill: 1 - Lipid panel  3. Gastroesophageal reflux disease without esophagitis Avoid spicy foods Do not eat 2 hours prior to bedtime - omeprazole (PRILOSEC) 40 MG capsule; Take 1 capsule (40 mg total) by mouth daily.  Dispense: 90 capsule; Refill: 1  4. RLS (restless legs syndrome) Keep legs warm at night - pramipexole (MIRAPEX) 0.25 MG tablet; TAKE 1 TABLET AT BEDTIME  Dispense: 90 tablet; Refill: 1  5. BMI 29.0-29.9,adult Discussed diet and exercise for person with BMI >25 Will recheck weight in 3-6 months    Labs pending Health Maintenance reviewed Diet and exercise encouraged  Follow up plan: 6 months   Mary-Margaret Hassell Done, FNP   Follow Up Instructions:    I discussed the assessment and treatment plan with the patient. The patient was provided an opportunity to ask questions and all were answered. The patient agreed with the plan and demonstrated an understanding of the instructions.   The patient was advised to call back or seek an in-person evaluation if the symptoms worsen or if the condition fails to improve as anticipated.  The  above assessment and management plan was discussed with the patient. The patient verbalized understanding of and has agreed to the management plan. Patient is aware to call the clinic if symptoms persist or worsen. Patient is aware when to return to the clinic for a follow-up visit. Patient educated on when it is appropriate to go to the emergency department.   Time call ended:  9:25  I provided 15 minutes of non-face-to-face time during this encounter.    Mary-Margaret Hassell Done, FNP

## 2019-10-24 LAB — CBC WITH DIFFERENTIAL/PLATELET
Basophils Absolute: 0 10*3/uL (ref 0.0–0.2)
Basos: 0 %
EOS (ABSOLUTE): 0.1 10*3/uL (ref 0.0–0.4)
Eos: 1 %
Hematocrit: 40.1 % (ref 34.0–46.6)
Hemoglobin: 13.2 g/dL (ref 11.1–15.9)
Immature Grans (Abs): 0 10*3/uL (ref 0.0–0.1)
Immature Granulocytes: 0 %
Lymphocytes Absolute: 1.8 10*3/uL (ref 0.7–3.1)
Lymphs: 25 %
MCH: 29.4 pg (ref 26.6–33.0)
MCHC: 32.9 g/dL (ref 31.5–35.7)
MCV: 89 fL (ref 79–97)
Monocytes Absolute: 0.4 10*3/uL (ref 0.1–0.9)
Monocytes: 5 %
Neutrophils Absolute: 5.1 10*3/uL (ref 1.4–7.0)
Neutrophils: 69 %
Platelets: 199 10*3/uL (ref 150–450)
RBC: 4.49 x10E6/uL (ref 3.77–5.28)
RDW: 13.9 % (ref 11.7–15.4)
WBC: 7.4 10*3/uL (ref 3.4–10.8)

## 2019-10-24 LAB — CMP14+EGFR
ALT: 15 IU/L (ref 0–32)
AST: 18 IU/L (ref 0–40)
Albumin/Globulin Ratio: 2.1 (ref 1.2–2.2)
Albumin: 4.4 g/dL (ref 3.7–4.7)
Alkaline Phosphatase: 65 IU/L (ref 39–117)
BUN/Creatinine Ratio: 15 (ref 12–28)
BUN: 14 mg/dL (ref 8–27)
Bilirubin Total: 0.4 mg/dL (ref 0.0–1.2)
CO2: 22 mmol/L (ref 20–29)
Calcium: 9.2 mg/dL (ref 8.7–10.3)
Chloride: 108 mmol/L — ABNORMAL HIGH (ref 96–106)
Creatinine, Ser: 0.94 mg/dL (ref 0.57–1.00)
GFR calc Af Amer: 69 mL/min/{1.73_m2} (ref >59)
GFR calc non Af Amer: 60 mL/min/{1.73_m2} (ref >59)
Globulin, Total: 2.1 g/dL (ref 1.5–4.5)
Glucose: 86 mg/dL (ref 65–99)
Potassium: 4.3 mmol/L (ref 3.5–5.2)
Sodium: 144 mmol/L (ref 134–144)
Total Protein: 6.5 g/dL (ref 6.0–8.5)

## 2019-10-24 LAB — LIPID PANEL
Chol/HDL Ratio: 3.7 ratio (ref 0.0–4.4)
Cholesterol, Total: 170 mg/dL (ref 100–199)
HDL: 46 mg/dL (ref >39)
LDL Chol Calc (NIH): 95 mg/dL (ref 0–99)
Triglycerides: 168 mg/dL — ABNORMAL HIGH (ref 0–149)
VLDL Cholesterol Cal: 29 mg/dL (ref 5–40)

## 2019-10-26 ENCOUNTER — Ambulatory Visit (INDEPENDENT_AMBULATORY_CARE_PROVIDER_SITE_OTHER): Payer: Medicare HMO | Admitting: *Deleted

## 2019-10-26 VITALS — Ht 65.0 in | Wt 179.9 lb

## 2019-10-26 DIAGNOSIS — Z0000 Encounter for general adult medical examination without abnormal findings: Secondary | ICD-10-CM

## 2019-10-26 NOTE — Progress Notes (Addendum)
MEDICARE ANNUAL WELLNESS VISIT  10/26/2019  Telephone Visit Disclaimer This Medicare AWV was conducted by telephone due to national recommendations for restrictions regarding the COVID-19 Pandemic (e.g. social distancing).  I verified, using two identifiers, that I am speaking with Amanda Park or their authorized healthcare agent. I discussed the limitations, risks, security, and privacy concerns of performing an evaluation and management service by telephone and the potential availability of an in-person appointment in the future. The patient expressed understanding and agreed to proceed.   Subjective:  Amanda Park is a 74 y.o. female patient of Amanda Park, Amanda Park who had a Medicare Annual Wellness Visit today via telephone. Amanda Park is Retired and lives with their spouse. she has 2 children. she reports that she is socially active and does interact with friends/family regularly. she is moderately physically active and enjoys sewing.  Patient Care Team: Amanda Pretty, FNP as PCP - General (Nurse Practitioner)  Advanced Directives 10/26/2019 11/15/2015 10/31/2015 09/11/2014  Does Patient Have a Medical Advance Directive? No No No No  Would patient like information on creating a medical advance directive? Yes (MAU/Ambulatory/Procedural Areas - Information given) No - patient declined information No - patient declined information No - patient declined information    Hospital Utilization Over the Past 12 Months: # of hospitalizations or ER visits: 0 # of surgeries: 0  Review of Systems    Patient reports that her overall health is unchanged compared to last year.  History obtained from chart review and the patient General ROS: positive for  - Nightly bilateral leg pain  Patient Reported Readings (BP, Pulse, CBG, Weight, etc) none  Pain Assessment Pain : No/denies pain     Current Medications & Allergies (verified) Allergies as of 10/26/2019   No Known Allergies     Medication List       Accurate as of October 26, 2019  2:55 PM. If you have any questions, ask your nurse or doctor.        STOP taking these medications   cyclobenzaprine 5 MG tablet Commonly known as: FLEXERIL   Melatonin 5 MG Caps   predniSONE 20 MG tablet Commonly known as: DELTASONE     TAKE these medications   aspirin EC 81 MG tablet Take 81 mg by mouth daily.   cetirizine 10 MG tablet Commonly known as: ZYRTEC Take 1 tablet (10 mg total) by mouth daily.   ibuprofen 200 MG tablet Commonly known as: ADVIL Take 400 mg by mouth every other day.   metoprolol succinate 25 MG 24 hr tablet Commonly known as: TOPROL-XL Take 1 tablet (25 mg total) by mouth daily.   omeprazole 40 MG capsule Commonly known as: PRILOSEC Take 1 capsule (40 mg total) by mouth daily.   pramipexole 0.25 MG tablet Commonly known as: MIRAPEX TAKE 1 TABLET AT BEDTIME   Shingrix injection Generic drug: Zoster Vaccine Adjuvanted   simvastatin 40 MG tablet Commonly known as: ZOCOR TAKE ONE DAILY       History (reviewed): Past Medical History:  Diagnosis Date  . Allergy   . Hyperlipidemia   . Hypertension   . Osteopenia   . PVC (premature ventricular contraction)    Past Surgical History:  Procedure Laterality Date  . ABDOMINAL HYSTERECTOMY    . cyst removed from left arm pit  03/15/2017  . FOOT SURGERY    . TMJ ARTHROPLASTY     Family History  Problem Relation Age of Onset  . Cancer Mother 88  cancer  . Hypertension Mother   . Kidney disease Mother   . Dementia Mother   . Heart disease Mother   . Heart disease Paternal Grandfather   . Prostate cancer Father   . Prostate cancer Brother   . Stomach cancer Paternal Uncle   . Kidney disease Sister   . Parkinson's disease Sister   . Diabetes Sister   . Heart disease Sister   . Dementia Sister   . Colon cancer Neg Hx    Social History   Socioeconomic History  . Marital status: Married    Spouse name: Amanda Park  . Number of children: 2  . Years of education: 52  . Highest education level: 12th grade  Occupational History  . Occupation: Retired  Tobacco Use  . Smoking status: Never Smoker  . Smokeless tobacco: Never Used  Substance and Sexual Activity  . Alcohol use: No    Alcohol/week: 0.0 standard drinks  . Drug use: No  . Sexual activity: Not Currently  Other Topics Concern  . Not on file  Social History Narrative  . Not on file   Social Determinants of Health   Financial Resource Strain: Low Risk   . Difficulty of Paying Living Expenses: Not hard at all  Food Insecurity: No Food Insecurity  . Worried About Charity fundraiser in the Last Year: Never true  . Ran Out of Food in the Last Year: Never true  Transportation Needs: No Transportation Needs  . Lack of Transportation (Medical): No  . Lack of Transportation (Non-Medical): No  Physical Activity: Sufficiently Active  . Days of Exercise per Week: 7 days  . Minutes of Exercise per Session: 50 min  Stress: No Stress Concern Present  . Feeling of Stress : Not at all  Social Connections: Not Isolated  . Frequency of Communication with Friends and Family: More than three times a week  . Frequency of Social Gatherings with Friends and Family: More than three times a week  . Attends Religious Services: More than 4 times per year  . Active Member of Clubs or Organizations: Yes  . Attends Archivist Meetings: More than 4 times per year  . Marital Status: Married    Activities of Daily Living In your present state of health, do you have any difficulty performing the following activities: 10/26/2019  Hearing? N  Vision? N  Difficulty concentrating or making decisions? N  Walking or climbing stairs? N  Dressing or bathing? N  Doing errands, shopping? N  Preparing Food and eating ? N  Using the Toilet? N  In the past six months, have you accidently leaked urine? N  Do you have problems with loss of bowel  control? N  Managing your Medications? N  Managing your Finances? N  Housekeeping or managing your Housekeeping? N  Some recent data might be hidden    Patient Education/ Literacy How often do you need to have someone help you when you read instructions, pamphlets, or other written materials from your doctor or pharmacy?: 1 - Never What is the last grade level you completed in school?: 12th Grade  Exercise Current Exercise Habits: Home exercise routine, Type of exercise: walking, Time (Minutes): 45, Frequency (Times/Week): 7, Weekly Exercise (Minutes/Week): 315, Intensity: Mild, Exercise limited by: None identified  Diet Patient reports consuming 2 meals a day and 1 snack(s) a day Patient reports that her primary diet is: Regular Patient reports that she does have regular access to food.  Depression Screen PHQ 2/9 Scores 10/26/2019 10/23/2019 08/07/2019 04/26/2019 10/13/2018 04/14/2018 02/02/2018  PHQ - 2 Score 0 0 0 0 0 0 0  PHQ- 9 Score - - - - - - -     Fall Risk Fall Risk  10/26/2019 08/07/2019 04/26/2019 10/13/2018 04/14/2018  Falls in the past year? 0 0 0 0 No  Number falls in past yr: 0 - - - -  Injury with Fall? 0 - - - -  Comment - - - - -  Risk for fall due to : No Fall Risks - - - -  Follow up Falls evaluation completed - - - -     Objective:  Amanda Park seemed alert and oriented and she participated appropriately during our telephone visit.  Blood Pressure Weight BMI  BP Readings from Last 3 Encounters:  08/07/19 (!) 175/68  04/26/19 138/84  10/13/18 140/69   Wt Readings from Last 3 Encounters:  10/26/19 179 lb 14.3 oz (81.6 kg)  08/07/19 180 lb (81.6 kg)  04/26/19 178 lb (80.7 kg)   BMI Readings from Last 1 Encounters:  10/26/19 29.94 kg/m    *Unable to obtain current vital signs, weight, and BMI due to telephone visit type  Hearing/Vision  . Jayle did not seem to have difficulty with hearing/understanding during the telephone conversation . Reports that she  has had a formal eye exam by an eye care professional within the past year . Reports that she has not had a formal hearing evaluation within the past year *Unable to fully assess hearing and vision during telephone visit type  Cognitive Function: No flowsheet data found. (Normal:0-7, Significant for Dysfunction: >8)  Normal Cognitive Function Screening: Yes   Immunization & Health Maintenance Record Immunization History  Administered Date(s) Administered  . Fluad Quad(high Dose 65+) 04/26/2019  . Influenza Split 04/20/2013  . Influenza, High Dose Seasonal PF 04/30/2016, 04/21/2017, 04/22/2018  . Influenza,inj,Quad PF,6+ Mos 04/17/2014, 05/07/2015  . Moderna SARS-COVID-2 Vaccination 08/19/2019, 09/21/2019  . Pneumococcal Conjugate-13 03/29/2015  . Pneumococcal Polysaccharide-23 06/27/2011, 04/26/2019  . Zoster 04/24/2011  . Zoster Recombinat (Shingrix) 06/27/2019    Health Maintenance  Topic Date Due  . TETANUS/TDAP  04/25/2020 (Originally 12/25/2013)  . INFLUENZA VACCINE  02/25/2020  . MAMMOGRAM  09/04/2020  . COLONOSCOPY  11/14/2020  . DEXA SCAN  Completed  . Hepatitis C Screening  Completed  . PNA vac Low Risk Adult  Completed       Assessment  This is a routine wellness examination for Amanda Park.  Health Maintenance: Due or Overdue There are no preventive care reminders to display for this patient.  Amanda Park does not need a referral for Community Assistance: Care Management:   no Social Work:    no Prescription Assistance:  no Nutrition/Diabetes Education:  no   Plan:  Personalized Goals Goals Addressed   None    Personalized Health Maintenance & Screening Recommendations  Td vaccine Advanced directives: has NO advanced directive  - add't info requested. Referral to SW: no Shingrix Vaccine  Lung Cancer Screening Recommended: no (Low Dose CT Chest recommended if Age 28-80 years, 30 pack-year currently smoking OR have quit w/in past 15  years) Hepatitis C Screening recommended: no HIV Screening recommended: no  Advanced Directives: Written information was prepared per patient's request.  Referrals & Orders No orders of the defined types were placed in this encounter.   Follow-up Plan . Follow-up with Amanda Pretty, FNP as planned   I have personally reviewed and noted  the following in the patient's chart:   . Medical and social history . Use of alcohol, tobacco or illicit drugs  . Current medications and supplements . Functional ability and status . Nutritional status . Physical activity . Advanced directives . List of other physicians . Hospitalizations, surgeries, and ER visits in previous 12 months . Vitals . Screenings to include cognitive, depression, and falls . Referrals and appointments  In addition, I have reviewed and discussed with Amanda Park certain preventive protocols, quality metrics, and best practice recommendations. A written personalized care plan for preventive services as well as general preventive health recommendations is available and can be mailed to the patient at her request.      Wardell Heath, LPN  [phone]   I have reviewed and agree with the above AWV documentation.   Mary-Margaret Hassell Done, FNP

## 2019-10-26 NOTE — Patient Instructions (Signed)
[place] Maintenance Summary and Written Plan of Care  Ms. Rowe Robert ,  Thank you for allowing me to perform your Medicare Annual Wellness Visit and for your ongoing commitment to your health.   Health Maintenance & Immunization History Health Maintenance  Topic Date Due  . TETANUS/TDAP  04/25/2020 (Originally 12/25/2013)  . INFLUENZA VACCINE  02/25/2020  . MAMMOGRAM  09/04/2020  . COLONOSCOPY  11/14/2020  . DEXA SCAN  Completed  . Hepatitis C Screening  Completed  . PNA vac Low Risk Adult  Completed   Immunization History  Administered Date(s) Administered  . Fluad Quad(high Dose 65+) 04/26/2019  . Influenza Split 04/20/2013  . Influenza, High Dose Seasonal PF 04/30/2016, 04/21/2017, 04/22/2018  . Influenza,inj,Quad PF,6+ Mos 04/17/2014, 05/07/2015  . Moderna SARS-COVID-2 Vaccination 08/19/2019, 09/21/2019  . Pneumococcal Conjugate-13 03/29/2015  . Pneumococcal Polysaccharide-23 06/27/2011, 04/26/2019  . Zoster 04/24/2011  . Zoster Recombinat (Shingrix) 06/27/2019    These are the patient goals that we discussed: Goals Addressed   None      This is a list of Health Maintenance Items that are overdue or due now: There are no preventive care reminders to display for this patient.   Orders/Referrals Placed Today: No orders of the defined types were placed in this encounter.  (Contact our referral department at [phone] if you have not spoken with someone about your referral appointment within the next 5 days)     Advance Directive  Advance directives are legal documents that let you make choices ahead of time about your health care and medical treatment in case you become unable to communicate for yourself. Advance directives are a way for you to make known your wishes to family, friends, and health care providers. This can let others know about your end-of-life care if you become unable to communicate. Discussing and writing advance  directives should happen over time rather than all at once. Advance directives can be changed depending on your situation and what you want, even after you have signed the advance directives. There are different types of advance directives, such as:  Medical power of attorney.  Living will.  Do not resuscitate (DNR) or do not attempt resuscitation (DNAR) order. Health care proxy and medical power of attorney A health care proxy is also called a health care agent. This is a person who is appointed to make medical decisions for you in cases where you are unable to make the decisions yourself. Generally, people choose someone they know well and trust to represent their preferences. Make sure to ask this person for an agreement to act as your proxy. A proxy may have to exercise judgment in the event of a medical decision for which your wishes are not known. A medical power of attorney is a legal document that names your health care proxy. Depending on the laws in your state, after the document is written, it may also need to be:  Signed.  Notarized.  Dated.  Copied.  Witnessed.  Incorporated into your medical record. You may also want to appoint someone to manage your money in a situation in which you are unable to do so. This is called a durable power of attorney for finances. It is a separate legal document from the durable power of attorney for health care. You may choose the same person or someone different from your health care proxy to act as your agent in money matters. If you do not appoint a proxy, or if there is  a concern that the proxy is not acting in your best interests, a court may appoint a guardian to act on your behalf. Living will A living will is a set of instructions that state your wishes about medical care when you cannot express them yourself. Health care providers should keep a copy of your living will in your medical record. You may want to give a copy to family members  or friends. To alert caregivers in case of an emergency, you can place a card in your wallet to let them know that you have a living will and where they can find it. A living will is used if you become:  Terminally ill.  Disabled.  Unable to communicate or make decisions. Items to consider in your living will include:  To use or not to use life-support equipment, such as dialysis machines and breathing machines (ventilators).  A DNR or DNAR order. This tells health care providers not to use cardiopulmonary resuscitation (CPR) if breathing or heartbeat stops.  To use or not to use tube feeding.  To be given or not to be given food and fluids.  Comfort (palliative) care when the goal becomes comfort rather than a cure.  Donation of organs and tissues. A living will does not give instructions for distributing your money and property if you should pass away. DNR or DNAR A DNR or DNAR order is a request not to have CPR in the event that your heart stops beating or you stop breathing. If a DNR or DNAR order has not been made and shared, a health care provider will try to help any patient whose heart has stopped or who has stopped breathing. If you plan to have surgery, talk with your health care provider about how your DNR or DNAR order will be followed if problems occur. What if I do not have an advance directive? If you do not have an advance directive, some states assign family decision makers to act on your behalf based on how closely you are related to them. Each state has its own laws about advance directives. You may want to check with your health care provider, attorney, or state representative about the laws in your state. Summary  Advance directives are the legal documents that allow you to make choices ahead of time about your health care and medical treatment in case you become unable to tell others about your care.  The process of discussing and writing advance directives should  happen over time. You can change the advance directives, even after you have signed them.  Advance directives include DNR or DNAR orders, living wills, and designating an agent as your medical power of attorney. This information is not intended to replace advice given to you by your health care provider. Make sure you discuss any questions you have with your health care provider. Document Revised: 02/09/2019 Document Reviewed: 02/09/2019 Elsevier Patient Education  [place].

## 2019-12-14 DIAGNOSIS — L821 Other seborrheic keratosis: Secondary | ICD-10-CM | POA: Diagnosis not present

## 2019-12-14 DIAGNOSIS — L814 Other melanin hyperpigmentation: Secondary | ICD-10-CM | POA: Diagnosis not present

## 2019-12-14 DIAGNOSIS — L579 Skin changes due to chronic exposure to nonionizing radiation, unspecified: Secondary | ICD-10-CM | POA: Diagnosis not present

## 2019-12-14 DIAGNOSIS — B079 Viral wart, unspecified: Secondary | ICD-10-CM | POA: Diagnosis not present

## 2019-12-14 DIAGNOSIS — L570 Actinic keratosis: Secondary | ICD-10-CM | POA: Diagnosis not present

## 2019-12-14 DIAGNOSIS — D1801 Hemangioma of skin and subcutaneous tissue: Secondary | ICD-10-CM | POA: Diagnosis not present

## 2019-12-14 DIAGNOSIS — D485 Neoplasm of uncertain behavior of skin: Secondary | ICD-10-CM | POA: Diagnosis not present

## 2019-12-14 DIAGNOSIS — L820 Inflamed seborrheic keratosis: Secondary | ICD-10-CM | POA: Diagnosis not present

## 2019-12-28 DIAGNOSIS — L579 Skin changes due to chronic exposure to nonionizing radiation, unspecified: Secondary | ICD-10-CM | POA: Diagnosis not present

## 2019-12-28 DIAGNOSIS — B079 Viral wart, unspecified: Secondary | ICD-10-CM | POA: Diagnosis not present

## 2020-01-24 DIAGNOSIS — L905 Scar conditions and fibrosis of skin: Secondary | ICD-10-CM | POA: Diagnosis not present

## 2020-01-24 DIAGNOSIS — D485 Neoplasm of uncertain behavior of skin: Secondary | ICD-10-CM | POA: Diagnosis not present

## 2020-01-25 DIAGNOSIS — L905 Scar conditions and fibrosis of skin: Secondary | ICD-10-CM | POA: Diagnosis not present

## 2020-02-07 DIAGNOSIS — L821 Other seborrheic keratosis: Secondary | ICD-10-CM | POA: Diagnosis not present

## 2020-02-07 DIAGNOSIS — Z4802 Encounter for removal of sutures: Secondary | ICD-10-CM | POA: Diagnosis not present

## 2020-02-07 DIAGNOSIS — L820 Inflamed seborrheic keratosis: Secondary | ICD-10-CM | POA: Diagnosis not present

## 2020-02-07 DIAGNOSIS — B079 Viral wart, unspecified: Secondary | ICD-10-CM | POA: Diagnosis not present

## 2020-02-07 DIAGNOSIS — L720 Epidermal cyst: Secondary | ICD-10-CM | POA: Diagnosis not present

## 2020-04-26 ENCOUNTER — Ambulatory Visit: Payer: Self-pay | Admitting: Nurse Practitioner

## 2020-04-29 ENCOUNTER — Ambulatory Visit (INDEPENDENT_AMBULATORY_CARE_PROVIDER_SITE_OTHER): Payer: Medicare HMO

## 2020-04-29 ENCOUNTER — Ambulatory Visit (INDEPENDENT_AMBULATORY_CARE_PROVIDER_SITE_OTHER): Payer: Medicare HMO | Admitting: Nurse Practitioner

## 2020-04-29 ENCOUNTER — Other Ambulatory Visit: Payer: Self-pay

## 2020-04-29 ENCOUNTER — Encounter: Payer: Self-pay | Admitting: Nurse Practitioner

## 2020-04-29 VITALS — BP 167/66 | HR 56 | Temp 96.2°F | Resp 20 | Ht 65.0 in | Wt 170.0 lb

## 2020-04-29 DIAGNOSIS — L989 Disorder of the skin and subcutaneous tissue, unspecified: Secondary | ICD-10-CM

## 2020-04-29 DIAGNOSIS — I10 Essential (primary) hypertension: Secondary | ICD-10-CM | POA: Diagnosis not present

## 2020-04-29 DIAGNOSIS — Z6829 Body mass index (BMI) 29.0-29.9, adult: Secondary | ICD-10-CM | POA: Diagnosis not present

## 2020-04-29 DIAGNOSIS — G2581 Restless legs syndrome: Secondary | ICD-10-CM

## 2020-04-29 DIAGNOSIS — M25562 Pain in left knee: Secondary | ICD-10-CM

## 2020-04-29 DIAGNOSIS — Z23 Encounter for immunization: Secondary | ICD-10-CM | POA: Diagnosis not present

## 2020-04-29 DIAGNOSIS — K219 Gastro-esophageal reflux disease without esophagitis: Secondary | ICD-10-CM

## 2020-04-29 DIAGNOSIS — D485 Neoplasm of uncertain behavior of skin: Secondary | ICD-10-CM

## 2020-04-29 DIAGNOSIS — E785 Hyperlipidemia, unspecified: Secondary | ICD-10-CM | POA: Diagnosis not present

## 2020-04-29 MED ORDER — METOPROLOL SUCCINATE ER 25 MG PO TB24: 25.0000 mg | ORAL_TABLET | Freq: Every day | ORAL | 1 refills | Status: DC

## 2020-04-29 MED ORDER — SIMVASTATIN 40 MG PO TABS: ORAL_TABLET | ORAL | 1 refills | Status: DC

## 2020-04-29 MED ORDER — OMEPRAZOLE 40 MG PO CPDR: 40.0000 mg | DELAYED_RELEASE_CAPSULE | Freq: Every day | ORAL | 1 refills | Status: DC

## 2020-04-29 MED ORDER — PRAMIPEXOLE DIHYDROCHLORIDE 0.25 MG PO TABS: ORAL_TABLET | ORAL | 1 refills | Status: DC

## 2020-04-29 MED ORDER — PREDNISONE 20 MG PO TABS: ORAL_TABLET | ORAL | 0 refills | Status: DC

## 2020-04-29 NOTE — Progress Notes (Signed)
Subjective:    Patient ID: Amanda Park, female    DOB: 24-Mar-1946, 74 y.o.   MRN: [id number]   Chief Complaint: Medical Management of Chronic Issues    HPI:  1. Essential hypertension, benign No c/o chest pain, sob or headache. Does not check blood pressure at home. BP Readings from Last 3 Encounters:  04/29/20 (!) 167/66  08/07/19 (!) 175/68  04/26/19 138/84     2. Hyperlipidemia with target LDL less than 100 Does try to watch diet but does not do much exercise.  Lab Results  Component Value Date   CHOL 170 10/23/2019   HDL 46 10/23/2019   LDLCALC 95 10/23/2019   TRIG 168 (H) 10/23/2019   CHOLHDL 3.7 10/23/2019     3. RLS (restless legs syndrome) Takes mirapex daily and it helps a lot.  4. Gastroesophageal reflux disease without esophagitis Is on omeprazole daily andis doing well.  5. BMI 29.0-29.9,adult No recent weight changes Wt Readings from Last 3 Encounters:  04/29/20 170 lb (77.1 kg)  10/26/19 179 lb 14.3 oz (81.6 kg)  08/07/19 180 lb (81.6 kg)   BMI Readings from Last 3 Encounters:  04/29/20 28.29 kg/m  10/26/19 29.94 kg/m  08/07/19 29.95 kg/m       Outpatient Encounter Medications as of 04/29/2020  Medication Sig  . aspirin EC 81 MG tablet Take 81 mg by mouth daily.  . cetirizine (ZYRTEC) 10 MG tablet Take 1 tablet (10 mg total) by mouth daily.  Marland Kitchen ibuprofen (ADVIL) 200 MG tablet Take 400 mg by mouth every other day.  . metoprolol succinate (TOPROL-XL) 25 MG 24 hr tablet Take 1 tablet (25 mg total) by mouth daily.  Marland Kitchen omeprazole (PRILOSEC) 40 MG capsule Take 1 capsule (40 mg total) by mouth daily.  . pramipexole (MIRAPEX) 0.25 MG tablet TAKE 1 TABLET AT BEDTIME  . simvastatin (ZOCOR) 40 MG tablet TAKE ONE DAILY    Past Surgical History:  Procedure Laterality Date  . ABDOMINAL HYSTERECTOMY    . cyst removed from left arm pit  03/15/2017  . FOOT SURGERY    . TMJ ARTHROPLASTY      Family History  Problem Relation Age of Onset  .  Cancer Mother 23       cancer  . Hypertension Mother   . Kidney disease Mother   . Dementia Mother   . Heart disease Mother   . Heart disease Paternal Grandfather   . Prostate cancer Father   . Prostate cancer Brother   . Stomach cancer Paternal Uncle   . Kidney disease Sister   . Parkinson's disease Sister   . Diabetes Sister   . Heart disease Sister   . Dementia Sister   . Colon cancer Neg Hx     New complaints: Left leg pain. Mainly around the medial side of her knee. Walking and standing increases pain. Sitting makes it better. Has been taking mtrin and tylenol whic  Social history: Lives with her husband   Controlled substance contract: n'a    Review of Systems  Constitutional: Negative for diaphoresis.  Eyes: Negative for pain.  Respiratory: Negative for shortness of breath.   Cardiovascular: Negative for chest pain, palpitations and leg swelling.  Gastrointestinal: Negative for abdominal pain.  Endocrine: Negative for polydipsia.  Musculoskeletal: Positive for arthralgias (medial side of left knee.).  Skin: Negative for rash.  Neurological: Negative for dizziness, weakness and headaches.  Hematological: Does not bruise/bleed easily.  All other systems reviewed and are negative.  Objective:   Physical Exam Vitals and nursing note reviewed.  Constitutional:      General: She is not in acute distress.    Appearance: Normal appearance. She is well-developed.  HENT:     Head: Normocephalic.     Nose: Nose normal.  Eyes:     Pupils: Pupils are equal, round, and reactive to light.  Neck:     Vascular: No carotid bruit or JVD.  Cardiovascular:     Rate and Rhythm: Normal rate and regular rhythm.     Heart sounds: Normal heart sounds.  Pulmonary:     Effort: Pulmonary effort is normal. No respiratory distress.     Breath sounds: Normal breath sounds. No wheezing or rales.  Chest:     Chest wall: No tenderness.  Abdominal:     General: Bowel sounds  are normal. There is no distension or abdominal bruit.     Palpations: Abdomen is soft. There is no hepatomegaly, splenomegaly, mass or pulsatile mass.     Tenderness: There is no abdominal tenderness.  Musculoskeletal:        General: Normal range of motion.     Cervical back: Normal range of motion and neck supple.     Comments: FROM of left knee with out pain when sitting Pain when standing. Crepitus on flexion and extension.  Lymphadenopathy:     Cervical: No cervical adenopathy.  Skin:    General: Skin is warm and dry.     Comments: 2cm dry macular lesion on right upper back.  Neurological:     Mental Status: She is alert and oriented to person, place, and time.     Deep Tendon Reflexes: Reflexes are normal and symmetric.  Psychiatric:        Behavior: Behavior normal.        Thought Content: Thought content normal.        Judgment: Judgment normal.     BP (!) 167/66   Pulse (!) 56   Temp (!) 96.2 F (35.7 C) (Temporal)   Resp 20   Ht 5\' 5"  (1.651 m)   Wt 170 lb (77.1 kg)   SpO2 98%   BMI 28.29 kg/m   Cryotherapy of lesion on back  Left knee xray- mild osteoarthritis-Preliminary reading by Ronnald Collum, FNP  [facility]      Assessment & Plan:  Amanda Park comes in today with chief complaint of Medical Management of Chronic Issues   Diagnosis and orders addressed:  1. Essential hypertension, benign Low sodium diet - metoprolol succinate (TOPROL-XL) 25 MG 24 hr tablet; Take 1 tablet (25 mg total) by mouth daily.  Dispense: 90 tablet; Refill: 1  2. Hyperlipidemia with target LDL less than 100 Low fat diet - simvastatin (ZOCOR) 40 MG tablet; TAKE ONE DAILY  Dispense: 90 tablet; Refill: 1  3. RLS (restless legs syndrome) Keep legs warm at night - pramipexole (MIRAPEX) 0.25 MG tablet; TAKE 1 TABLET AT BEDTIME  Dispense: 90 tablet; Refill: 1  4. Gastroesophageal reflux disease without esophagitis Avoid spicy foods Do not eat 2 hours prior to bedtime - omeprazole  (PRILOSEC) 40 MG capsule; Take 1 capsule (40 mg total) by mouth daily.  Dispense: 90 capsule; Refill: 1  5. BMI 29.0-29.9,adult Discussed diet and exercise for person with BMI >25 Will recheck weight in 3-6 months  6. Acute pain of left knee Wrap up when walking or standing - DG Knee 1-2 Views Left - predniSONE (DELTASONE) 20 MG tablet; 2 po at sametime  daily for 5 days  Dispense: 10 tablet; Refill: 0  7. Skin lesion of back Lesion area may turn black   Labs pending Health Maintenance reviewed Diet and exercise encouraged  Follow up plan:  6 months   [place], FNP

## 2020-04-29 NOTE — Patient Instructions (Signed)

## 2020-04-29 NOTE — Addendum Note (Signed)
Addended by: Rolena Infante on: 04/29/2020 11:10 AM   Modules accepted: Orders

## 2020-04-30 LAB — CMP14+EGFR
ALT: 16 IU/L (ref 0–32)
AST: 16 IU/L (ref 0–40)
Albumin/Globulin Ratio: 2.2 (ref 1.2–2.2)
Albumin: 4.8 g/dL — ABNORMAL HIGH (ref 3.7–4.7)
Alkaline Phosphatase: 64 IU/L (ref 44–121)
BUN/Creatinine Ratio: 18 (ref 12–28)
BUN: 17 mg/dL (ref 8–27)
Bilirubin Total: 0.4 mg/dL (ref 0.0–1.2)
CO2: 22 mmol/L (ref 20–29)
Calcium: 9.2 mg/dL (ref 8.7–10.3)
Chloride: 107 mmol/L — ABNORMAL HIGH (ref 96–106)
Creatinine, Ser: 0.94 mg/dL (ref 0.57–1.00)
GFR calc Af Amer: 69 mL/min/{1.73_m2} (ref >59)
GFR calc non Af Amer: 60 mL/min/{1.73_m2} (ref >59)
Globulin, Total: 2.2 g/dL (ref 1.5–4.5)
Glucose: 95 mg/dL (ref 65–99)
Potassium: 4.5 mmol/L (ref 3.5–5.2)
Sodium: 143 mmol/L (ref 134–144)
Total Protein: 7 g/dL (ref 6.0–8.5)

## 2020-04-30 LAB — LIPID PANEL
Chol/HDL Ratio: 3.5 ratio (ref 0.0–4.4)
Cholesterol, Total: 173 mg/dL (ref 100–199)
HDL: 50 mg/dL (ref >39)
LDL Chol Calc (NIH): 101 mg/dL — ABNORMAL HIGH (ref 0–99)
Triglycerides: 125 mg/dL (ref 0–149)
VLDL Cholesterol Cal: 22 mg/dL (ref 5–40)

## 2020-04-30 LAB — CBC WITH DIFFERENTIAL/PLATELET
Basophils Absolute: 0 10*3/uL (ref 0.0–0.2)
Basos: 1 %
EOS (ABSOLUTE): 0.1 10*3/uL (ref 0.0–0.4)
Eos: 1 %
Hematocrit: 41.2 % (ref 34.0–46.6)
Hemoglobin: 13.8 g/dL (ref 11.1–15.9)
Immature Grans (Abs): 0 10*3/uL (ref 0.0–0.1)
Immature Granulocytes: 0 %
Lymphocytes Absolute: 2.1 10*3/uL (ref 0.7–3.1)
Lymphs: 33 %
MCH: 29.7 pg (ref 26.6–33.0)
MCHC: 33.5 g/dL (ref 31.5–35.7)
MCV: 89 fL (ref 79–97)
Monocytes Absolute: 0.4 10*3/uL (ref 0.1–0.9)
Monocytes: 5 %
Neutrophils Absolute: 3.9 10*3/uL (ref 1.4–7.0)
Neutrophils: 60 %
Platelets: 194 10*3/uL (ref 150–450)
RBC: 4.65 x10E6/uL (ref 3.77–5.28)
RDW: 13 % (ref 11.7–15.4)
WBC: 6.5 10*3/uL (ref 3.4–10.8)

## 2020-08-14 ENCOUNTER — Other Ambulatory Visit: Payer: Self-pay | Admitting: Nurse Practitioner

## 2020-08-14 DIAGNOSIS — Z1231 Encounter for screening mammogram for malignant neoplasm of breast: Secondary | ICD-10-CM

## 2020-08-22 DIAGNOSIS — F319 Bipolar disorder, unspecified: Secondary | ICD-10-CM | POA: Diagnosis not present

## 2020-08-22 DIAGNOSIS — F4310 Post-traumatic stress disorder, unspecified: Secondary | ICD-10-CM | POA: Diagnosis not present

## 2020-08-22 DIAGNOSIS — F339 Major depressive disorder, recurrent, unspecified: Secondary | ICD-10-CM | POA: Diagnosis not present

## 2020-09-03 DIAGNOSIS — F319 Bipolar disorder, unspecified: Secondary | ICD-10-CM | POA: Diagnosis not present

## 2020-09-03 DIAGNOSIS — F4310 Post-traumatic stress disorder, unspecified: Secondary | ICD-10-CM | POA: Diagnosis not present

## 2020-09-10 ENCOUNTER — Other Ambulatory Visit: Payer: Self-pay | Admitting: Nurse Practitioner

## 2020-09-10 DIAGNOSIS — E785 Hyperlipidemia, unspecified: Secondary | ICD-10-CM

## 2020-09-10 DIAGNOSIS — K219 Gastro-esophageal reflux disease without esophagitis: Secondary | ICD-10-CM

## 2020-09-10 DIAGNOSIS — I10 Essential (primary) hypertension: Secondary | ICD-10-CM

## 2020-09-12 DIAGNOSIS — F339 Major depressive disorder, recurrent, unspecified: Secondary | ICD-10-CM | POA: Diagnosis not present

## 2020-09-12 DIAGNOSIS — F4310 Post-traumatic stress disorder, unspecified: Secondary | ICD-10-CM | POA: Diagnosis not present

## 2020-09-12 DIAGNOSIS — F319 Bipolar disorder, unspecified: Secondary | ICD-10-CM | POA: Diagnosis not present

## 2020-09-18 ENCOUNTER — Ambulatory Visit
Admission: RE | Admit: 2020-09-18 | Discharge: 2020-09-18 | Disposition: A | Discharge status: Home / Self Care | Payer: Medicare HMO | Source: Ambulatory Visit | Attending: Nurse Practitioner | Admitting: Nurse Practitioner

## 2020-09-18 ENCOUNTER — Other Ambulatory Visit: Payer: Self-pay

## 2020-09-18 DIAGNOSIS — Z1231 Encounter for screening mammogram for malignant neoplasm of breast: Secondary | ICD-10-CM

## 2020-09-23 ENCOUNTER — Other Ambulatory Visit: Payer: Self-pay | Admitting: Nurse Practitioner

## 2020-09-23 DIAGNOSIS — R928 Other abnormal and inconclusive findings on diagnostic imaging of breast: Secondary | ICD-10-CM

## 2020-09-30 DIAGNOSIS — F319 Bipolar disorder, unspecified: Secondary | ICD-10-CM | POA: Diagnosis not present

## 2020-09-30 DIAGNOSIS — F4310 Post-traumatic stress disorder, unspecified: Secondary | ICD-10-CM | POA: Diagnosis not present

## 2020-10-09 ENCOUNTER — Telehealth: Payer: Self-pay

## 2020-10-09 NOTE — Telephone Encounter (Signed)
Orders have been faxed for second time

## 2020-10-09 NOTE — Telephone Encounter (Signed)
Mika called from [facility] stating that patient has an appt with them tomorrow morning at 8:15 and they need order sent to them for pt to have LT Breast US done.  Fax# [phone]

## 2020-10-10 ENCOUNTER — Other Ambulatory Visit: Payer: Self-pay | Admitting: *Deleted

## 2020-10-10 DIAGNOSIS — R922 Inconclusive mammogram: Secondary | ICD-10-CM | POA: Diagnosis not present

## 2020-10-10 DIAGNOSIS — R920 Mammographic microcalcification found on diagnostic imaging of breast: Secondary | ICD-10-CM | POA: Diagnosis not present

## 2020-10-23 ENCOUNTER — Other Ambulatory Visit: Payer: Self-pay

## 2020-10-23 ENCOUNTER — Telehealth: Payer: Self-pay

## 2020-10-23 DIAGNOSIS — R928 Other abnormal and inconclusive findings on diagnostic imaging of breast: Secondary | ICD-10-CM

## 2020-10-23 DIAGNOSIS — N6320 Unspecified lump in the left breast, unspecified quadrant: Secondary | ICD-10-CM

## 2020-10-23 DIAGNOSIS — N6310 Unspecified lump in the right breast, unspecified quadrant: Secondary | ICD-10-CM

## 2020-10-23 NOTE — Telephone Encounter (Signed)
Lady from [facility] called stating that pt is scheduled with them for tomorrow morning at 8:30 and they need order for RT and LT Mammogram for Breast Stereo Biopsy.  Please fax orders to [phone]

## 2020-10-24 ENCOUNTER — Other Ambulatory Visit: Payer: Self-pay | Admitting: *Deleted

## 2020-10-24 DIAGNOSIS — D242 Benign neoplasm of left breast: Secondary | ICD-10-CM | POA: Diagnosis not present

## 2020-10-24 DIAGNOSIS — R921 Mammographic calcification found on diagnostic imaging of breast: Secondary | ICD-10-CM | POA: Diagnosis not present

## 2020-10-24 DIAGNOSIS — D241 Benign neoplasm of right breast: Secondary | ICD-10-CM | POA: Diagnosis not present

## 2020-10-24 DIAGNOSIS — R920 Mammographic microcalcification found on diagnostic imaging of breast: Secondary | ICD-10-CM | POA: Diagnosis not present

## 2020-10-24 HISTORY — PX: BREAST BIOPSY: SHX20

## 2020-10-25 NOTE — Telephone Encounter (Signed)
Orders have been faxed and verified that they were recieved

## 2020-10-28 ENCOUNTER — Other Ambulatory Visit: Payer: Self-pay

## 2020-10-28 ENCOUNTER — Ambulatory Visit (INDEPENDENT_AMBULATORY_CARE_PROVIDER_SITE_OTHER): Payer: Medicare HMO | Admitting: Nurse Practitioner

## 2020-10-28 ENCOUNTER — Encounter: Payer: Self-pay | Admitting: Nurse Practitioner

## 2020-10-28 VITALS — BP 129/60 | HR 63 | Temp 97.2°F | Ht 65.0 in | Wt 180.6 lb

## 2020-10-28 DIAGNOSIS — E785 Hyperlipidemia, unspecified: Secondary | ICD-10-CM

## 2020-10-28 DIAGNOSIS — G2581 Restless legs syndrome: Secondary | ICD-10-CM

## 2020-10-28 DIAGNOSIS — I10 Essential (primary) hypertension: Secondary | ICD-10-CM

## 2020-10-28 DIAGNOSIS — K219 Gastro-esophageal reflux disease without esophagitis: Secondary | ICD-10-CM

## 2020-10-28 DIAGNOSIS — Z6829 Body mass index (BMI) 29.0-29.9, adult: Secondary | ICD-10-CM | POA: Diagnosis not present

## 2020-10-28 MED ORDER — ESCITALOPRAM OXALATE 10 MG PO TABS: 10.0000 mg | ORAL_TABLET | Freq: Every day | ORAL | 1 refills | Status: DC

## 2020-10-28 MED ORDER — OMEPRAZOLE 40 MG PO CPDR: 1.0000 | DELAYED_RELEASE_CAPSULE | Freq: Every day | ORAL | 1 refills | Status: DC

## 2020-10-28 MED ORDER — GABAPENTIN 100 MG PO CAPS: 100.0000 mg | ORAL_CAPSULE | Freq: Every day | ORAL | 1 refills | Status: DC

## 2020-10-28 MED ORDER — SIMVASTATIN 40 MG PO TABS: ORAL_TABLET | ORAL | 0 refills | Status: DC

## 2020-10-28 MED ORDER — PRAMIPEXOLE DIHYDROCHLORIDE 0.25 MG PO TABS
ORAL_TABLET | ORAL | 1 refills | Status: DC
Start: 2020-10-28 — End: 2021-04-30

## 2020-10-28 MED ORDER — METOPROLOL SUCCINATE ER 25 MG PO TB24: 1.0000 | ORAL_TABLET | Freq: Every day | ORAL | 1 refills | Status: DC

## 2020-10-28 NOTE — Progress Notes (Signed)
Subjective:    Patient ID: Amanda Park, female    DOB: [birth year], 75 y.o.   MRN: [id number]   Chief Complaint: Follow up for chronic disease management.    HPI:  1. Primary hypertension Currently on metoprolol succinate. Does monitor BP at home. Runs in the 120's/60's.   2. Hyperlipidemia with target LDL less than 100 Currently on simvastatin. Tolerating well. Tries to follow low fat diet, exercise.   Lab Results  Component Value Date   CHOL 173 04/29/2020   HDL 50 04/29/2020   LDLCALC 101 (H) 04/29/2020   TRIG 125 04/29/2020   CHOLHDL 3.5 04/29/2020   The 10-year ASCVD risk score [name], et al., 2013) is: 21.1%   Values used to calculate the score:     Age: 67 years     Sex: Female     Is Non-Hispanic African American: No     Diabetic: No     Tobacco smoker: No     Systolic Blood Pressure: 941 mmHg     Is BP treated: Yes     HDL Cholesterol: 50 mg/dL     Total Cholesterol: 173 mg/dL  3. Gastroesophageal reflux disease without esophagitis Currently on omeprazole. Tries to avoid spicy foods, eating late at night.   4. RLS (restless legs syndrome) Currently on pramipexole. Does not work every night. She does have left leg pain. Takes tylenol before bed and it does help.   5. BMI 29.0-29.9,adult Up 10lbs since October. Has to cook for her husbands palate.   Wt Readings from Last 3 Encounters:  10/28/20 180 lb 9.6 oz (81.9 kg)  04/29/20 170 lb (77.1 kg)  10/26/19 179 lb 14.3 oz (81.6 kg)   BMI Readings from Last 3 Encounters:  10/28/20 30.05 kg/m  04/29/20 28.29 kg/m  10/26/19 29.94 kg/m     Outpatient Encounter Medications as of 10/28/2020  Medication Sig  . aspirin EC 81 MG tablet Take 81 mg by mouth daily.  . cetirizine (ZYRTEC) 10 MG tablet Take 1 tablet (10 mg total) by mouth daily.  Marland Kitchen ibuprofen (ADVIL) 200 MG tablet Take 400 mg by mouth every other day.  . metoprolol succinate (TOPROL-XL) 25 MG 24 hr tablet TAKE 1 TABLET EVERY DAY  .  omeprazole (PRILOSEC) 40 MG capsule TAKE 1 CAPSULE EVERY DAY  . pramipexole (MIRAPEX) 0.25 MG tablet TAKE 1 TABLET AT BEDTIME  . predniSONE (DELTASONE) 20 MG tablet 2 po at sametime daily for 5 days  . simvastatin (ZOCOR) 40 MG tablet TAKE 1 TABLET EVERY DAY   No facility-administered encounter medications on file as of 10/28/2020.    Past Surgical History:  Procedure Laterality Date  . ABDOMINAL HYSTERECTOMY    . cyst removed from left arm pit  03/15/2017  . FOOT SURGERY    . TMJ ARTHROPLASTY      Family History  Problem Relation Age of Onset  . Cancer Mother 57       cancer  . Hypertension Mother   . Kidney disease Mother   . Dementia Mother   . Heart disease Mother   . Breast cancer Mother   . Heart disease Paternal Grandfather   . Prostate cancer Father   . Prostate cancer Brother   . Stomach cancer Paternal Uncle   . Kidney disease Sister   . Parkinson's disease Sister   . Diabetes Sister   . Heart disease Sister   . Dementia Sister   . Colon cancer Neg Hx  New complaints: Patient as been having a lot of anxiety. Hr family wanted her to see psych. So she has ben 4x a nd they started hr on gabapentin for anxiety. She says she is not going back because they were no help to her and gabapentin ony made her feel funny.     Social history: Lives with husband  Controlled substance contract: N/A   Review of Systems  Constitutional: Negative for chills, fatigue and fever.  Respiratory: Negative for cough, shortness of breath and wheezing.   Cardiovascular: Negative for chest pain and palpitations.  Gastrointestinal: Negative for abdominal pain, constipation and diarrhea.  Genitourinary: Negative for difficulty urinating, dysuria and hematuria.  Neurological: Negative for dizziness, syncope and light-headedness.  Psychiatric/Behavioral: Negative for confusion. The patient is not nervous/anxious.        Vitals:   10/28/20 0957  BP: 129/60  Pulse: 63  Temp:  (!) 97.2 F (36.2 C)  SpO2: 92%    Objective:   Physical Exam Neck:     Vascular: No carotid bruit.  Cardiovascular:     Rate and Rhythm: Normal rate and regular rhythm.     Pulses: Normal pulses.     Heart sounds: Normal heart sounds.  Pulmonary:     Effort: Pulmonary effort is normal.     Breath sounds: Normal breath sounds.  Abdominal:     General: Bowel sounds are normal.     Palpations: Abdomen is soft.  Musculoskeletal:        General: Normal range of motion.     Cervical back: Normal range of motion and neck supple.  Skin:    General: Skin is warm and dry.     Capillary Refill: Capillary refill takes less than 2 seconds.  Neurological:     Mental Status: She is alert and oriented to person, place, and time.  Psychiatric:        Mood and Affect: Mood normal.        Behavior: Behavior normal.       Assessment & Plan:   Amanda Park comes in today with chief complaint of Medical Management of Chronic Issues   Diagnosis and orders addressed:  1. Primary hypertension Continue medication as prescribed. Continue to monitor at home. Low sodium diet, regular exercise encouraged.   - metoprolol succinate (TOPROL-XL) 25 MG 24 hr tablet; Take 1 tablet (25 mg total) by mouth daily.  Dispense: 90 tablet; Refill: 1  2. Hyperlipidemia with target LDL less than 100 Continue medication as prescribed. Regular exercise and low fat diet encouraged.  - simvastatin (ZOCOR) 40 MG tablet; TAKE 1 TABLET EVERY DAY  Dispense: 90 tablet; Refill: 0  3. Gastroesophageal reflux disease without esophagitis Continue to avoid triggers. Continue medication as prescribed.  - omeprazole (PRILOSEC) 40 MG capsule; Take 1 capsule (40 mg total) by mouth daily.  Dispense: 90 capsule; Refill: 1  4. RLS (restless legs syndrome) Discussed weighted blanket. Continue medications as prescribed. Continue PRN tylenol for acute aches and pains.   - pramipexole (MIRAPEX) 0.25 MG tablet; TAKE 1 TABLET AT  BEDTIME  Dispense: 90 tablet; Refill: 1  5. BMI 29.0-29.9,adult Weight up 10lbs. We discussed low fat diet, exercise, tracking foods.   6. anxiety Stress management Wean off gabapentin Add lexapro 10mg  1 po daily #90n1 refill- side effect sdiscussed  Labs pending Health Maintenance reviewed Diet and exercise encouraged  Follow up plan: 6 months  Dollene Primrose, RN, BSN, FNP-Student  Mary-Margaret Hassell Done, FNP

## 2020-10-28 NOTE — Addendum Note (Signed)
Addended by: Nigel Berthold C on: 10/28/2020 10:48 AM   Modules accepted: Orders

## 2020-10-28 NOTE — Patient Instructions (Signed)
Exercising to Stay Healthy To become healthy and stay healthy, it is recommended that you do moderate-intensity and vigorous-intensity exercise. You can tell that you are exercising at a moderate intensity if your heart starts beating faster and you start breathing faster but can still hold a conversation. You can tell that you are exercising at a vigorous intensity if you are breathing much harder and faster and cannot hold a conversation while exercising. Exercising regularly is important. It has many health benefits, such as:  Improving overall fitness, flexibility, and endurance.  Increasing bone density.  Helping with weight control.  Decreasing body fat.  Increasing muscle strength.  Reducing stress and tension.  Improving overall health. How often should I exercise? Choose an activity that you enjoy, and set realistic goals. Your health care provider can help you make an activity plan that works for you. Exercise regularly as told by your health care provider. This may include:  Doing strength training two times a week, such as: ? Lifting weights. ? Using resistance bands. ? Push-ups. ? Sit-ups. ? Yoga.  Doing a certain intensity of exercise for a given amount of time. Choose from these options: ? A total of 150 minutes of moderate-intensity exercise every week. ? A total of 75 minutes of vigorous-intensity exercise every week. ? A mix of moderate-intensity and vigorous-intensity exercise every week. Children, pregnant women, people who have not exercised regularly, people who are overweight, and older adults may need to talk with a health care provider about what activities are safe to do. If you have a medical condition, be sure to talk with your health care provider before you start a new exercise program. What are some exercise ideas? Moderate-intensity exercise ideas include:  Walking 1 mile (1.6 km) in about 15  minutes.  Biking.  Hiking.  Golfing.  Dancing.  Water aerobics. Vigorous-intensity exercise ideas include:  Walking 4.5 miles (7.2 km) or more in about 1 hour.  Jogging or running 5 miles (8 km) in about 1 hour.  Biking 10 miles (16.1 km) or more in about 1 hour.  Lap swimming.  Roller-skating or in-line skating.  Cross-country skiing.  Vigorous competitive sports, such as football, basketball, and soccer.  Jumping rope.  Aerobic dancing.   What are some everyday activities that can help me to get exercise?  Yard work, such as: ? Pushing a lawn mower. ? Raking and bagging leaves.  Washing your car.  Pushing a stroller.  Shoveling snow.  Gardening.  Washing windows or floors. How can I be more active in my day-to-day activities?  Use stairs instead of an elevator.  Take a walk during your lunch break.  If you drive, park your car farther away from your work or school.  If you take public transportation, get off one stop early and walk the rest of the way.  Stand up or walk around during all of your indoor phone calls.  Get up, stretch, and walk around every 30 minutes throughout the day.  Enjoy exercise with a friend. Support to continue exercising will help you keep a regular routine of activity. What guidelines can I follow while exercising?  Before you start a new exercise program, talk with your health care provider.  Do not exercise so much that you hurt yourself, feel dizzy, or get very short of breath.  Wear comfortable clothes and wear shoes with good support.  Drink plenty of water while you exercise to prevent dehydration or heat stroke.  Work out until   your breathing and your heartbeat get faster. Where to find more information  [place] Department of Health and Human Services: www.hhs.gov  Centers for Disease Control and Prevention (CDC): www.cdc.gov Summary  Exercising regularly is important. It will improve your overall fitness,  flexibility, and endurance.  Regular exercise also will improve your overall health. It can help you control your weight, reduce stress, and improve your bone density.  Do not exercise so much that you hurt yourself, feel dizzy, or get very short of breath.  Before you start a new exercise program, talk with your health care provider. This information is not intended to replace advice given to you by your health care provider. Make sure you discuss any questions you have with your health care provider. Document Revised: 06/25/2017 Document Reviewed: 06/03/2017 Elsevier Patient Education  2021 [employer]  

## 2020-10-29 LAB — CMP14+EGFR
ALT: 19 IU/L (ref 0–32)
AST: 17 IU/L (ref 0–40)
Albumin/Globulin Ratio: 2 (ref 1.2–2.2)
Albumin: 4.3 g/dL (ref 3.7–4.7)
Alkaline Phosphatase: 65 IU/L (ref 44–121)
BUN/Creatinine Ratio: 17 (ref 12–28)
BUN: 16 mg/dL (ref 8–27)
Bilirubin Total: 0.4 mg/dL (ref 0.0–1.2)
CO2: 21 mmol/L (ref 20–29)
Calcium: 9.4 mg/dL (ref 8.7–10.3)
Chloride: 107 mmol/L — ABNORMAL HIGH (ref 96–106)
Creatinine, Ser: 0.95 mg/dL (ref 0.57–1.00)
Globulin, Total: 2.1 g/dL (ref 1.5–4.5)
Glucose: 93 mg/dL (ref 65–99)
Potassium: 4.5 mmol/L (ref 3.5–5.2)
Sodium: 143 mmol/L (ref 134–144)
Total Protein: 6.4 g/dL (ref 6.0–8.5)
eGFR: 62 mL/min/{1.73_m2} (ref >59)

## 2020-10-29 LAB — CBC WITH DIFFERENTIAL/PLATELET
Basophils Absolute: 0 10*3/uL (ref 0.0–0.2)
Basos: 1 %
EOS (ABSOLUTE): 0.1 10*3/uL (ref 0.0–0.4)
Eos: 1 %
Hematocrit: 41.2 % (ref 34.0–46.6)
Hemoglobin: 14 g/dL (ref 11.1–15.9)
Immature Grans (Abs): 0 10*3/uL (ref 0.0–0.1)
Immature Granulocytes: 0 %
Lymphocytes Absolute: 1.8 10*3/uL (ref 0.7–3.1)
Lymphs: 30 %
MCH: 29.2 pg (ref 26.6–33.0)
MCHC: 34 g/dL (ref 31.5–35.7)
MCV: 86 fL (ref 79–97)
Monocytes Absolute: 0.3 10*3/uL (ref 0.1–0.9)
Monocytes: 6 %
Neutrophils Absolute: 3.6 10*3/uL (ref 1.4–7.0)
Neutrophils: 62 %
Platelets: 196 10*3/uL (ref 150–450)
RBC: 4.79 x10E6/uL (ref 3.77–5.28)
RDW: 12.8 % (ref 11.7–15.4)
WBC: 5.8 10*3/uL (ref 3.4–10.8)

## 2020-10-29 LAB — LIPID PANEL
Chol/HDL Ratio: 3.3 ratio (ref 0.0–4.4)
Cholesterol, Total: 147 mg/dL (ref 100–199)
HDL: 44 mg/dL (ref >39)
LDL Chol Calc (NIH): 80 mg/dL (ref 0–99)
Triglycerides: 130 mg/dL (ref 0–149)
VLDL Cholesterol Cal: 23 mg/dL (ref 5–40)

## 2020-11-15 ENCOUNTER — Ambulatory Visit: Payer: Medicare HMO

## 2020-11-15 ENCOUNTER — Ambulatory Visit (HOSPITAL_COMMUNITY)
Admission: RE | Admit: 2020-11-15 | Discharge: 2020-11-15 | Disposition: A | Discharge status: Home / Self Care | Payer: Medicare HMO | Source: Ambulatory Visit | Attending: Nurse Practitioner | Admitting: Nurse Practitioner

## 2020-11-15 ENCOUNTER — Other Ambulatory Visit: Payer: Self-pay

## 2020-11-15 ENCOUNTER — Encounter: Payer: Self-pay | Admitting: Nurse Practitioner

## 2020-11-15 ENCOUNTER — Ambulatory Visit (INDEPENDENT_AMBULATORY_CARE_PROVIDER_SITE_OTHER): Payer: Medicare HMO | Admitting: Nurse Practitioner

## 2020-11-15 VITALS — BP 142/69 | HR 85 | Temp 97.9°F | Resp 20 | Ht 65.0 in | Wt 178.0 lb

## 2020-11-15 DIAGNOSIS — M79605 Pain in left leg: Secondary | ICD-10-CM | POA: Insufficient documentation

## 2020-11-15 DIAGNOSIS — R600 Localized edema: Secondary | ICD-10-CM | POA: Diagnosis not present

## 2020-11-15 DIAGNOSIS — M7122 Synovial cyst of popliteal space [Baker], left knee: Secondary | ICD-10-CM | POA: Diagnosis not present

## 2020-11-15 MED ORDER — KETOROLAC TROMETHAMINE 30 MG/ML IJ SOLN
60.0000 mg | Freq: Once | INTRAMUSCULAR | Status: AC
  Administered 2020-11-15: 60 mg via INTRAMUSCULAR

## 2020-11-15 MED ORDER — TRAMADOL HCL 50 MG PO TABS: 50.0000 mg | ORAL_TABLET | Freq: Three times a day (TID) | ORAL | 0 refills | Status: AC | PRN

## 2020-11-15 NOTE — Patient Instructions (Signed)
Baker Cyst  A Baker cyst, also called a popliteal cyst, is a growth that forms at the back of the knee. The cyst forms when the fluid-filled sac (bursa) that cushions the knee joint becomes enlarged. What are the causes? In most cases, a Baker cyst results from another knee problem that causes swelling inside the knee. This makes the fluid inside the knee joint (synovial fluid) flow into the bursa behind the knee, causing the bursa to enlarge. What increases the risk? You may be more likely to develop a Baker cyst if you already have a knee problem, such as:  A tear in cartilage that cushions the knee joint (meniscal tear).  A tear in the tissues that connect the bones of the knee joint (ligament tear).  Knee swelling from osteoarthritis, rheumatoid arthritis, or gout. What are the signs or symptoms? The main symptom of this condition is a lump behind the knee. This may be the only symptom of the condition. The lump may be painful, especially when the knee is straightened. If the lump is painful, the pain may come and go. The knee may also be stiff. Symptoms may quickly get more severe if the cyst breaks open (ruptures). If the cyst ruptures, you may feel the following in your knee and calf:  Sudden or worsening pain.  Swelling.  Bruising.  Redness in the calf. A Baker cyst does not always cause symptoms. How is this diagnosed? This condition may be diagnosed based on your symptoms and medical history. Your health care provider will also do a physical exam. This may include:  Feeling the cyst to check whether it is tender.  Checking your knee for signs of another knee condition that causes swelling. You may have imaging tests, such as:  X-rays.  MRI.  Ultrasound. How is this treated? A Baker cyst that is not painful may go away without treatment. If the cyst gets large or painful, it will likely get better if the underlying knee problem is treated. If needed, treatment for a  Baker cyst may include:  Resting.  Keeping weight off of the knee. This means not leaning on the knee to support your body weight.  Taking NSAIDs, such as ibuprofen, to reduce pain and swelling.  Having a procedure to drain the fluid from the cyst with a needle (aspiration). You may also get an injection of a medicine that reduces swelling (steroid).  Having surgery. This may be needed if other treatments do not work. This usually involves correcting knee damage and removing the cyst. Follow these instructions at home: Activity  Rest as told by your health care provider.  Avoid activities that make pain or swelling worse.  Return to your normal activities as told by your health care provider. Ask your health care provider what activities are safe for you.  Do not use the injured limb to support your body weight until your health care provider says that you can. Use crutches as told by your health care provider. General instructions  Take over-the-counter and prescription medicines only as told by your health care provider.  Keep all follow-up visits as told by your health care provider. This is important.   Contact a health care provider if:  You have knee pain, stiffness, or swelling that does not get better. Get help right away if:  You have sudden or worsening pain and swelling in your calf area. Summary  A Baker cyst, also called a popliteal cyst, is a growth that forms at   the back of the knee.  In most cases, a Baker cyst results from another knee problem that causes swelling inside the knee.  A Baker cyst that is not painful may go away without treatment.  If needed, treatment for a Baker cyst may include resting, keeping weight off of the knee, medicines, or draining fluid from the cyst.  Surgery may be needed if other treatments are not effective. This information is not intended to replace advice given to you by your health care provider. Make sure you discuss any  questions you have with your health care provider. Document Revised: 11/25/2018 Document Reviewed: 11/25/2018 Elsevier Patient Education  [place]. Deep Vein Thrombosis  Deep vein thrombosis (DVT) is a condition in which a blood clot forms in a deep vein, such as a vein in the lower leg, thigh, pelvis, or arm. Deep veins are veins in the deep venous system. A clot is blood that has thickened into a gel or solid. This condition is serious and can be life-threatening if the clot travels to the lungs and causes a blockage (pulmonary embolism) in the arteries of the lung. A DVT can also damage veins in the leg. This can lead to long-term, or chronic, venous disease, leg pain, swelling, discoloration, and ulcers or sores (post-thrombotic syndrome). What are the causes? This condition may be caused by:  A slowdown of blood flow.  Damage to a vein.  A condition that causes blood to clot more easily, such as certain blood-clotting disorders. What increases the risk? The following factors may make you more likely to develop this condition:  Having obesity.  Being older, especially older than age 84.  Being inactive (sedentary lifestyle) or not moving around. This may include: ? Sitting or lying down for longer than 4-6 hours other than to sleep at night. ? Being in the hospital, having major or lengthy surgery, or having a thin, flexible tube (central line catheter) placed in a large vein.  Being pregnant, giving birth, or having recently given birth.  Taking medicines that contain estrogen, such as birth control or hormone replacement therapy.  Using products that contain nicotine or tobacco, especially if you use hormonal birth control.  Having a history of blood clots or a blood-clotting disease, a blood vessel disease (peripheral vascular disease), or congestive heart disease.  Having a history of cancer, especially if being treated with chemotherapy. What are the signs or  symptoms? Symptoms of this condition include:  Swelling, pain, pressure, or tenderness in an arm or a leg.  An arm or a leg becoming warm, red, or discolored.  A leg turning very pale. You may have a large DVT. This is rare. If the clot is in your leg, you may notice symptoms more or have worse symptoms when you stand or walk. In some cases, there are no symptoms. How is this diagnosed? This condition is diagnosed with:  Your medical history and a physical exam.  Tests, such as: ? Blood tests to check how well your blood clots. ? Doppler ultrasound. This is the best way to find a DVT. ? Venogram. Contrast dye is injected into a vein, and X-rays are taken to check for clots. How is this treated? Treatment for this condition depends on:  The cause of your DVT.  The size and location of your DVT, or having more than one DVT.  Your risk for bleeding or developing more clots.  Other medical conditions you may have. Treatment may include:  Taking  a blood thinner, also called an anticoagulant, to prevent clots from forming and growing.  Wearing compression stockings, if directed.  Injecting medicines into the affected vein to break up the clot (catheter-directed thrombolysis). This is used only for severe DVT and only if a specialist recommends it.  Specific surgical procedures, when DVT is severe or hard to treat. These may be done to: ? Isolate and remove your clot. ? Place an inferior vena cava (IVC) filter in a large vein to catch blood clots before they reach your lungs. You may get some medical treatments for 6 months or longer. Follow these instructions at home: If you are taking blood thinners:  Talk with your health care provider before you take any medicines that contain aspirin or NSAIDs, such as ibuprofen. These medicines increase your risk for dangerous bleeding.  Take your medicine exactly as told, at the same time every day. Do not skip a dose. Do not take more  than the prescribed dose. This is important.  Ask your health care provider about foods and medicines that could change the way your blood thinner works (may interact). Avoid these foods and medicines if you are told to do so.  Avoid anything that may cause bleeding or bruising. You may bleed more easily while taking blood thinners. ? Be very careful when using knives, scissors, or other sharp objects. ? Use an electric razor instead of a blade. ? Avoid activities that could cause injury or bruising, and follow instructions for preventing falls. ? Tell your health care provider if you have had any internal bleeding, bleeding ulcers, or neurologic diseases, such as strokes or cerebral aneurysms.  Wear a medical alert bracelet or carry a card that lists what medicines you take. General instructions  Take over-the-counter and prescription medicines only as told by your health care provider.  Return to your normal activities as told by your health care provider. Ask your health care provider what activities are safe for you.  If recommended, wear compression stockings as told by your health care provider. These stockings help to prevent blood clots and reduce swelling in your legs.  Keep all follow-up visits as told by your health care provider. This is important. Contact a health care provider if:  You miss a dose of your blood thinner.  You have new or worse pain, swelling, or redness in an arm or a leg.  You have worsening numbness or tingling in an arm or a leg.  You have unusual bruising. Get help right away if:  You have signs or symptoms that a blood clot has moved to the lungs. These may include: ? Shortness of breath. ? Chest pain. ? Fast or irregular heartbeats (palpitations). ? Light-headedness or dizziness. ? Coughing up blood.  You have signs or symptoms that your blood is too thin. These may include: ? Blood in your vomit, stool, or urine. ? A cut that will not stop  bleeding. ? A menstrual period that is heavier than usual. ? A severe headache or confusion. These symptoms may represent a serious problem that is an emergency. Do not wait to see if the symptoms will go away. Get medical help right away. Call your local emergency services (911 in the [place] Do not drive yourself to the hospital. Summary  Deep vein thrombosis (DVT) happens when a blood clot forms in a deep vein. This may occur in the lower leg, thigh, pelvis, or arm.  Symptoms affect the arm or leg and can include  swelling, pain, tenderness, warmth, redness, or discoloration.  This condition may be treated with medicines or compression stockings. In severe cases, surgery may be done.  If you are taking blood thinners, take them exactly as told. Do not skip a dose. Do not take more than is prescribed.  Get help right away if you have shortness of breath, chest pain, fast or irregular heartbeats, or blood in your vomit, urine, or stool. This information is not intended to replace advice given to you by your health care provider. Make sure you discuss any questions you have with your health care provider. Document Revised: 07/08/2019 Document Reviewed: 07/08/2019 Elsevier Patient Education  2021 [employer].

## 2020-11-15 NOTE — Addendum Note (Signed)
Addended by: [name] on: 11/15/2020 03:52 PM   Modules accepted: Orders

## 2020-11-15 NOTE — Progress Notes (Addendum)
   Subjective:    Patient ID: Amanda Park, female    DOB: 10-Mar-1946, 75 y.o.   MRN: [id number]   Chief Complaint: Leg Pain   HPI Patient comes in today c/o left leg pain. Started last week. Pain is manily posterior to knee. Rates pain 10. She has been putting ice on it. Painful to walk on.    Review of Systems  Constitutional: Negative for diaphoresis.  Eyes: Negative for pain.  Respiratory: Negative for shortness of breath.   Cardiovascular: Negative for chest pain, palpitations and leg swelling.  Gastrointestinal: Negative for abdominal pain.  Endocrine: Negative for polydipsia.  Skin: Negative for rash.  Neurological: Negative for dizziness, weakness and headaches.  Hematological: Does not bruise/bleed easily.  All other systems reviewed and are negative.      Objective:   Physical Exam Vitals and nursing note reviewed.  Constitutional:      Appearance: Normal appearance.  Cardiovascular:     Rate and Rhythm: Normal rate and regular rhythm.     Heart sounds: Normal heart sounds.  Pulmonary:     Breath sounds: Normal breath sounds.  Musculoskeletal:     Comments: Left lower leg edema with painful nodule posterior knee  Skin:    General: Skin is warm.  Neurological:     General: No focal deficit present.     Mental Status: She is alert and oriented to person, place, and time.  Psychiatric:        Mood and Affect: Mood normal.        Behavior: Behavior normal.    BP (!) 142/69   Pulse 85   Temp 97.9 F (36.6 C) (Temporal)   Resp 20   Ht 5\' 5"  (1.651 m)   Wt 178 lb (80.7 kg)   SpO2 95%   BMI 29.62 kg/m         Assessment & Plan:  Amanda Park in today with chief complaint of Leg Pain   1. Leg pain, posterior, left Elevate U/s doppler ordered Will call with scan results Meds ordered this encounter  Medications  . ketorolac (TORADOL) 30 MG/ML injection 60 mg  . traMADol (ULTRAM) 50 MG tablet    Sig: Take 1 tablet (50 mg total) by mouth every  8 (eight) hours as needed for up to 5 days.    Dispense:  15 tablet    Refill:  0    Order Specific Question:   Supervising Provider    Answer:   Caryl Pina A [[id number]]      The above assessment and management plan was discussed with the patient. The patient verbalized understanding of and has agreed to the management plan. Patient is aware to call the clinic if symptoms persist or worsen. Patient is aware when to return to the clinic for a follow-up visit. Patient educated on when it is appropriate to go to the emergency department.   Amanda Park Done, FNP

## 2020-11-18 ENCOUNTER — Telehealth: Payer: Self-pay

## 2020-11-18 ENCOUNTER — Other Ambulatory Visit: Payer: Self-pay

## 2020-11-18 DIAGNOSIS — M79605 Pain in left leg: Secondary | ICD-10-CM

## 2020-11-18 NOTE — Telephone Encounter (Signed)
Pt called stating that MMM sent her to have an US done on her leg on 11/15/20 and it showed that pt has a bakers cyst on her knee. Says MMM is supposed to be ordering her to see an Orthopedic Surgeon and wants to make sure MMM gets this done today because pt is in a lot of pain and can hardly walk.   Please advise and call patient on her cell phone.. [phone]

## 2020-11-18 NOTE — Telephone Encounter (Signed)
Please review and advise.

## 2020-11-19 ENCOUNTER — Telehealth: Payer: Self-pay

## 2020-11-19 ENCOUNTER — Ambulatory Visit (HOSPITAL_COMMUNITY): Admission: RE | Admit: 2020-11-19 | Payer: Medicare HMO | Source: Ambulatory Visit

## 2020-11-19 NOTE — Telephone Encounter (Signed)
Patient is scheduled for tomorrow per referrals. Patient notified and address given.

## 2020-11-20 DIAGNOSIS — M25562 Pain in left knee: Secondary | ICD-10-CM | POA: Diagnosis not present

## 2020-11-21 ENCOUNTER — Encounter: Payer: Self-pay | Admitting: Gastroenterology

## 2020-11-26 ENCOUNTER — Ambulatory Visit (INDEPENDENT_AMBULATORY_CARE_PROVIDER_SITE_OTHER): Payer: Medicare HMO

## 2020-11-26 ENCOUNTER — Ambulatory Visit: Payer: Medicare HMO

## 2020-11-26 VITALS — Ht 65.0 in | Wt 178.0 lb

## 2020-11-26 DIAGNOSIS — Z1211 Encounter for screening for malignant neoplasm of colon: Secondary | ICD-10-CM

## 2020-11-26 DIAGNOSIS — Z0000 Encounter for general adult medical examination without abnormal findings: Secondary | ICD-10-CM

## 2020-11-26 NOTE — Patient Instructions (Signed)
Amanda Park , Thank you for taking time to come for your Medicare Wellness Visit. I appreciate your ongoing commitment to your health goals. Please review the following plan we discussed and let me know if I can assist you in the future.   Screening recommendations/referrals: Colonoscopy: Done 11/15/2015 - Repeat due this year - Referral sent - call for appointment when convenient Mammogram: Done 10/10/2020 - Repeat annually Bone Density: Done 03/30/2017 - Repeat every 2 years - okay to do at next visit in office Recommended yearly ophthalmology/optometry visit for glaucoma screening and checkup Recommended yearly dental visit for hygiene and checkup  Vaccinations: Influenza vaccine: Done 04/29/2020 - Repeat annually Pneumococcal vaccine: Done 03/29/2015 & 04/26/2019 Tdap vaccine: DUE every 10 years Shingles vaccine: Zostavax done 04/24/2011; first Shingrix done 06/27/2019 -  DUE for second dose   Covid-19: Done 08/19/19, 09/21/19, & 05/23/2020 - Due for second booster - check with pharmacy  Advanced directives: Advance directive discussed with you today. I have provided a copy for you to complete at home and have notarized. Once this is complete please bring a copy in to our office so we can scan it into your chart.  Conditions/risks identified: Aim for 30 minutes of exercise or brisk walking each day, drink 6-8 glasses of water and eat lots of fruits and vegetables.  Next appointment: Follow up in one year for your annual wellness visit    Preventive Care 65 Years and Older, Female Preventive care refers to lifestyle choices and visits with your health care provider that can promote health and wellness. What does preventive care include?  A yearly physical exam. This is also called an annual well check.  Dental exams once or twice a year.  Routine eye exams. Ask your health care provider how often you should have your eyes checked.  Personal lifestyle choices, including:  Daily care of your  teeth and gums.  Regular physical activity.  Eating a healthy diet.  Avoiding tobacco and drug use.  Limiting alcohol use.  Practicing safe sex.  Taking low-dose aspirin every day.  Taking vitamin and mineral supplements as recommended by your health care provider. What happens during an annual well check? The services and screenings done by your health care provider during your annual well check will depend on your age, overall health, lifestyle risk factors, and family history of disease. Counseling  Your health care provider may ask you questions about your:  Alcohol use.  Tobacco use.  Drug use.  Emotional well-being.  Home and relationship well-being.  Sexual activity.  Eating habits.  History of falls.  Memory and ability to understand (cognition).  Work and work Statistician.  Reproductive health. Screening  You may have the following tests or measurements:  Height, weight, and BMI.  Blood pressure.  Lipid and cholesterol levels. These may be checked every 5 years, or more frequently if you are over 106 years old.  Skin check.  Lung cancer screening. You may have this screening every year starting at age 13 if you have a 30-pack-year history of smoking and currently smoke or have quit within the past 15 years.  Fecal occult blood test (FOBT) of the stool. You may have this test every year starting at age 56.  Flexible sigmoidoscopy or colonoscopy. You may have a sigmoidoscopy every 5 years or a colonoscopy every 10 years starting at age 56.  Hepatitis C blood test.  Hepatitis B blood test.  Sexually transmitted disease (STD) testing.  Diabetes screening. This is  done by checking your blood sugar (glucose) after you have not eaten for a while (fasting). You may have this done every 1-3 years.  Bone density scan. This is done to screen for osteoporosis. You may have this done starting at age 40.  Mammogram. This may be done every 1-2 years. Talk  to your health care provider about how often you should have regular mammograms. Talk with your health care provider about your test results, treatment options, and if necessary, the need for more tests. Vaccines  Your health care provider may recommend certain vaccines, such as:  Influenza vaccine. This is recommended every year.  Tetanus, diphtheria, and acellular pertussis (Tdap, Td) vaccine. You may need a Td booster every 10 years.  Zoster vaccine. You may need this after age 55.  Pneumococcal 13-valent conjugate (PCV13) vaccine. One dose is recommended after age 53.  Pneumococcal polysaccharide (PPSV23) vaccine. One dose is recommended after age 10. Talk to your health care provider about which screenings and vaccines you need and how often you need them. This information is not intended to replace advice given to you by your health care provider. Make sure you discuss any questions you have with your health care provider. Document Released: 08/09/2015 Document Revised: 04/01/2016 Document Reviewed: 05/14/2015 Elsevier Interactive Patient Education  2017 [place] Prevention in the Home Falls can cause injuries. They can happen to people of all ages. There are many things you can do to make your home safe and to help prevent falls. What can I do on the outside of my home?  Regularly fix the edges of walkways and driveways and fix any cracks.  Remove anything that might make you trip as you walk through a door, such as a raised step or threshold.  Trim any bushes or trees on the path to your home.  Use bright outdoor lighting.  Clear any walking paths of anything that might make someone trip, such as rocks or tools.  Regularly check to see if handrails are loose or broken. Make sure that both sides of any steps have handrails.  Any raised decks and porches should have guardrails on the edges.  Have any leaves, snow, or ice cleared regularly.  Use sand or salt on  walking paths during winter.  Clean up any spills in your garage right away. This includes oil or grease spills. What can I do in the bathroom?  Use night lights.  Install grab bars by the toilet and in the tub and shower. Do not use towel bars as grab bars.  Use non-skid mats or decals in the tub or shower.  If you need to sit down in the shower, use a plastic, non-slip stool.  Keep the floor dry. Clean up any water that spills on the floor as soon as it happens.  Remove soap buildup in the tub or shower regularly.  Attach bath mats securely with double-sided non-slip rug tape.  Do not have throw rugs and other things on the floor that can make you trip. What can I do in the bedroom?  Use night lights.  Make sure that you have a light by your bed that is easy to reach.  Do not use any sheets or blankets that are too big for your bed. They should not hang down onto the floor.  Have a firm chair that has side arms. You can use this for support while you get dressed.  Do not have throw rugs and other things  on the floor that can make you trip. What can I do in the kitchen?  Clean up any spills right away.  Avoid walking on wet floors.  Keep items that you use a lot in easy-to-reach places.  If you need to reach something above you, use a strong step stool that has a grab bar.  Keep electrical cords out of the way.  Do not use floor polish or wax that makes floors slippery. If you must use wax, use non-skid floor wax.  Do not have throw rugs and other things on the floor that can make you trip. What can I do with my stairs?  Do not leave any items on the stairs.  Make sure that there are handrails on both sides of the stairs and use them. Fix handrails that are broken or loose. Make sure that handrails are as long as the stairways.  Check any carpeting to make sure that it is firmly attached to the stairs. Fix any carpet that is loose or worn.  Avoid having throw  rugs at the top or bottom of the stairs. If you do have throw rugs, attach them to the floor with carpet tape.  Make sure that you have a light switch at the top of the stairs and the bottom of the stairs. If you do not have them, ask someone to add them for you. What else can I do to help prevent falls?  Wear shoes that:  Do not have high heels.  Have rubber bottoms.  Are comfortable and fit you well.  Are closed at the toe. Do not wear sandals.  If you use a stepladder:  Make sure that it is fully opened. Do not climb a closed stepladder.  Make sure that both sides of the stepladder are locked into place.  Ask someone to hold it for you, if possible.  Clearly mark and make sure that you can see:  Any grab bars or handrails.  First and last steps.  Where the edge of each step is.  Use tools that help you move around (mobility aids) if they are needed. These include:  Canes.  Walkers.  Scooters.  Crutches.  Turn on the lights when you go into a dark area. Replace any light bulbs as soon as they burn out.  Set up your furniture so you have a clear path. Avoid moving your furniture around.  If any of your floors are uneven, fix them.  If there are any pets around you, be aware of where they are.  Review your medicines with your doctor. Some medicines can make you feel dizzy. This can increase your chance of falling. Ask your doctor what other things that you can do to help prevent falls. This information is not intended to replace advice given to you by your health care provider. Make sure you discuss any questions you have with your health care provider. Document Released: 05/09/2009 Document Revised: 12/19/2015 Document Reviewed: 08/17/2014 Elsevier Interactive Patient Education  2017 [employer].

## 2020-11-26 NOTE — Progress Notes (Signed)
Subjective:   Remonia Otte is a 75 y.o. female who presents for Medicare Annual (Subsequent) preventive examination.  Virtual Visit via Telephone Note  I connected with  [name] on 11/26/20 at  4:15 PM EDT by telephone and verified that I am speaking with the correct person using two identifiers.  Location: Patient: Home Provider: [facility] Persons participating in the virtual visit: patient/Nurse Health Advisor   I discussed the limitations, risks, security and privacy concerns of performing an evaluation and management service by telephone and the availability of in person appointments. The patient expressed understanding and agreed to proceed.  Interactive audio and video telecommunications were attempted between this nurse and patient, however failed, due to patient having technical difficulties OR patient did not have access to video capability.  We continued and completed visit with audio only.  Some vital signs may be absent or patient reported.   Bisma Klett E Anjolina Byrer, LPN   Review of Systems     Cardiac Risk Factors include: advanced age (>43men, >67 women);hypertension;dyslipidemia     Objective:    Today's Vitals   11/26/20 1403  Weight: 178 lb (80.7 kg)  Height: 5\' 5"  (1.651 m)   Body mass index is 29.62 kg/m.  Advanced Directives 11/26/2020 10/26/2019 11/15/2015 10/31/2015 09/11/2014  Does Patient Have a Medical Advance Directive? No No No No No  Would patient like information on creating a medical advance directive? Yes (MAU/Ambulatory/Procedural Areas - Information given) Yes (MAU/Ambulatory/Procedural Areas - Information given) No - patient declined information No - patient declined information No - patient declined information    Current Medications (verified) Outpatient Encounter Medications as of 11/26/2020  Medication Sig  . aspirin EC 81 MG tablet Take 81 mg by mouth daily.  . cetirizine (ZYRTEC) 10 MG tablet Take 1 tablet (10 mg total) by mouth daily.  Marland Kitchen  escitalopram (LEXAPRO) 10 MG tablet Take 1 tablet (10 mg total) by mouth daily.  Marland Kitchen ibuprofen (ADVIL) 200 MG tablet Take 400 mg by mouth every other day.  . levocetirizine (XYZAL) 5 MG tablet Take 5 mg by mouth every evening.  . melatonin 5 MG TABS Take 5 mg by mouth.  . metoprolol succinate (TOPROL-XL) 25 MG 24 hr tablet Take 1 tablet (25 mg total) by mouth daily.  Marland Kitchen omeprazole (PRILOSEC) 40 MG capsule Take 1 capsule (40 mg total) by mouth daily.  . pramipexole (MIRAPEX) 0.25 MG tablet TAKE 1 TABLET AT BEDTIME  . simvastatin (ZOCOR) 40 MG tablet TAKE 1 TABLET EVERY DAY  . traMADol (ULTRAM) 50 MG tablet Ultram 50 mg tablet  Take 1 tablet every 4-6 hours by oral route.   No facility-administered encounter medications on file as of 11/26/2020.    Allergies (verified) Patient has no known allergies.   History: Past Medical History:  Diagnosis Date  . Allergy   . Hyperlipidemia   . Hypertension   . Osteopenia   . PVC (premature ventricular contraction)    Past Surgical History:  Procedure Laterality Date  . ABDOMINAL HYSTERECTOMY    . cyst removed from left arm pit  03/15/2017  . FOOT SURGERY    . TMJ ARTHROPLASTY     Family History  Problem Relation Age of Onset  . Cancer Mother 30       cancer  . Hypertension Mother   . Kidney disease Mother   . Dementia Mother   . Heart disease Mother   . Breast cancer Mother   . Heart disease Paternal Grandfather   .  Prostate cancer Father   . Prostate cancer Brother   . Stomach cancer Paternal Uncle   . Kidney disease Sister   . Parkinson's disease Sister   . Diabetes Sister   . Heart disease Sister   . Dementia Sister   . Colon cancer Neg Hx    Social History   Socioeconomic History  . Marital status: Married    Spouse name: Guido Sander  . Number of children: 2  . Years of education: 8  . Highest education level: 12th grade  Occupational History  . Occupation: Retired  Tobacco Use  . Smoking status: Never Smoker  .  Smokeless tobacco: Never Used  Vaping Use  . Vaping Use: Never used  Substance and Sexual Activity  . Alcohol use: No    Alcohol/week: 0.0 standard drinks  . Drug use: No  . Sexual activity: Not Currently  Other Topics Concern  . Not on file  Social History Narrative   Married, lives with husband, very active in church, outdoors, gardening   Social Determinants of Health   Financial Resource Strain: [place]   . Difficulty of Paying Living Expenses: Not hard at all  Food Insecurity: No Food Insecurity  . Worried About Charity fundraiser in the Last Year: Never true  . Ran Out of Food in the Last Year: Never true  Transportation Needs: No Transportation Needs  . Lack of Transportation (Medical): No  . Lack of Transportation (Non-Medical): No  Physical Activity: Sufficiently Active  . Days of Exercise per Week: 7 days  . Minutes of Exercise per Session: 30 min  Stress: No Stress Concern Present  . Feeling of Stress : Not at all  Social Connections: Socially Integrated  . Frequency of Communication with Friends and Family: More than three times a week  . Frequency of Social Gatherings with Friends and Family: More than three times a week  . Attends Religious Services: More than 4 times per year  . Active Member of Clubs or Organizations: Yes  . Attends Archivist Meetings: More than 4 times per year  . Marital Status: Married    Tobacco Counseling Counseling given: Not Answered   Clinical Intake:  Pre-visit preparation completed: Yes  Pain : No/denies pain     BMI - recorded: 29.62 Nutritional Status: BMI 25 -29 Overweight Nutritional Risks: None Diabetes: No  How often do you need to have someone help you when you read instructions, pamphlets, or other written materials from your doctor or pharmacy?: 1 - Never  Diabetic? No  Interpreter Needed?: No  Information entered by :: Laquia Rosano, LPN   Activities of Daily Living In your present state of  health, do you have any difficulty performing the following activities: 11/26/2020  Hearing? N  Vision? N  Difficulty concentrating or making decisions? N  Walking or climbing stairs? N  Dressing or bathing? N  Doing errands, shopping? N  Preparing Food and eating ? N  Using the Toilet? N  In the past six months, have you accidently leaked urine? N  Do you have problems with loss of bowel control? N  Managing your Medications? N  Managing your Finances? N  Housekeeping or managing your Housekeeping? N  Some recent data might be hidden    Patient Care Team: [name], FNP as PCP - General (Nurse Practitioner)  Indicate any recent Medical Services you may have received from other than [facility] providers in the past year (date may be approximate).  Assessment:   This is a routine wellness examination for Thana.  Hearing/Vision screen  Hearing Screening   125Hz  250Hz  500Hz  1000Hz  2000Hz  3000Hz  4000Hz  6000Hz  8000Hz   Right ear:           Left ear:           Comments: Denies hearing difficulties  Vision Screening Comments: Annual visits with [facility] in [place] - past due for eye exam - wears eyeglasses  Dietary issues and exercise activities discussed: Current Exercise Habits: Home exercise routine, Type of exercise: walking, Time (Minutes): 30, Frequency (Times/Week): 7, Weekly Exercise (Minutes/Week): 210, Intensity: Mild, Exercise limited by: None identified  Goals Addressed            This Visit's Progress   . Patient Stated       Would like to lose some weight and travel more Goals Addressed             This Visit's Progress   . Patient Stated       Would like to lose some weight and travel more             Depression Screen PHQ 2/9 Scores 11/26/2020 11/15/2020 10/28/2020 04/29/2020 10/26/2019 10/23/2019 08/07/2019  PHQ - 2 Score 0 0 0 0 0 0 0  PHQ- 9 Score - - - - - - -    Fall Risk Fall Risk  11/26/2020 11/15/2020 10/28/2020 04/29/2020 10/26/2019  Falls  in the past year? 1 0 0 0 0  Number falls in past yr: 1 - - - 0  Comment she fell once because of painful cyst on back of knee - after I&D later that week, she was resting in a wheelchair and it flipped backward and she hit head - no injury - no since problems - - - -  Injury with Fall? 0 - - - 0  Comment - - - - -  Risk for fall due to : History of fall(s);Impaired vision - - - No Fall Risks  Follow up Education provided;Falls prevention discussed - - - Falls evaluation completed    FALL RISK PREVENTION PERTAINING TO THE HOME:  Any stairs in or around the home? Yes  If so, are there any without handrails? No  Home free of loose throw rugs in walkways, pet beds, electrical cords, etc? Yes  Adequate lighting in your home to reduce risk of falls? Yes   ASSISTIVE DEVICES UTILIZED TO PREVENT FALLS:  Life alert? No  Use of a cane, walker or w/c? No  Grab bars in the bathroom? Yes  Shower chair or bench in shower? Yes  Elevated toilet seat or a handicapped toilet? Yes   TIMED UP AND GO:  Was the test performed? No .  Telephonic visit.  Cognitive Function:Normal cognitive status assessed by direct observation by this Nurse Health Advisor. No abnormalities found.         Immunizations Immunization History  Administered Date(s) Administered  . Fluad Quad(high Dose 65+) 04/26/2019, 04/29/2020  . Influenza Split 04/20/2013  . Influenza, High Dose Seasonal PF 04/30/2016, 04/21/2017, 04/22/2018  . Influenza,inj,Quad PF,6+ Mos 04/17/2014, 05/07/2015  . Moderna SARS-COV2 Booster Vaccination 05/23/2020  . Moderna Sars-Covid-2 Vaccination 08/19/2019, 09/21/2019  . Pneumococcal Conjugate-13 03/29/2015  . Pneumococcal Polysaccharide-23 06/27/2011, 04/26/2019  . Zoster 04/24/2011  . Zoster Recombinat (Shingrix) 06/27/2019    TDAP status: Due, Education has been provided regarding the importance of this vaccine. Advised may receive this vaccine at local pharmacy or Health Dept. Aware to  provide a copy of the vaccination record if obtained from local pharmacy or Health Dept. Verbalized acceptance and understanding.  Flu Vaccine status: Up to date  Pneumococcal vaccine status: Up to date  Covid-19 vaccine status: Completed vaccines  Qualifies for Shingles Vaccine? Yes   Zostavax completed Yes   Shingrix Completed?: No.    Education has been provided regarding the importance of this vaccine. Patient has been advised to call insurance company to determine out of pocket expense if they have not yet received this vaccine. Advised may also receive vaccine at local pharmacy or Health Dept. Verbalized acceptance and understanding.  Screening Tests Health Maintenance  Topic Date Due  . DEXA SCAN  03/31/2019  . COLONOSCOPY (Pts 45-71yrs Insurance coverage will need to be confirmed)  11/14/2020  . COVID-19 Vaccine (4 - Booster for Moderna series) 11/21/2020  . TETANUS/TDAP  04/29/2021 (Originally 12/25/2013)  . INFLUENZA VACCINE  02/24/2021  . MAMMOGRAM  10/10/2021  . Hepatitis C Screening  Completed  . PNA vac Low Risk Adult  Completed  . HPV VACCINES  Aged Out    Health Maintenance  Health Maintenance Due  Topic Date Due  . DEXA SCAN  03/31/2019  . COLONOSCOPY (Pts 45-55yrs Insurance coverage will need to be confirmed)  11/14/2020  . COVID-19 Vaccine (4 - Booster for Moderna series) 11/21/2020    Colorectal cancer screening: Referral to GI placed 11/26/20. Pt aware the office will call re: appt.  Mammogram status: Completed 10/10/20. Repeat every year  Bone Density status: Completed 03/30/2017. Results reflect: Bone density results: OSTEOPENIA. Repeat every 2 years.  Lung Cancer Screening: (Low Dose CT Chest recommended if Age 50-80 years, 30 pack-year currently smoking OR have quit w/in 15years.) does not qualify.   Additional Screening:  Hepatitis C Screening: does qualify; Completed 03/27/2016  Vision Screening: Recommended annual ophthalmology exams for early  detection of glaucoma and other disorders of the eye. Is the patient up to date with their annual eye exam?  No  Who is the provider or what is the name of the office in which the patient attends annual eye exams? [facility] If pt is not established with a provider, would they like to be referred to a provider to establish care? No .   Dental Screening: Recommended annual dental exams for proper oral hygiene  Community Resource Referral / Chronic Care Management: CRR required this visit?  No   CCM required this visit?  No      Plan:     I have personally reviewed and noted the following in the patient's chart:   . Medical and social history . Use of alcohol, tobacco or illicit drugs  . Current medications and supplements including opioid prescriptions.  . Functional ability and status . Nutritional status . Physical activity . Advanced directives . List of other physicians . Hospitalizations, surgeries, and ER visits in previous 12 months . Vitals . Screenings to include cognitive, depression, and falls . Referrals and appointments  In addition, I have reviewed and discussed with patient certain preventive protocols, quality metrics, and best practice recommendations. A written personalized care plan for preventive services as well as general preventive health recommendations were provided to patient.     Sandrea Hammond, LPN   [id number]   Nurse Notes: None

## 2021-01-30 DIAGNOSIS — Z23 Encounter for immunization: Secondary | ICD-10-CM | POA: Diagnosis not present

## 2021-04-29 ENCOUNTER — Ambulatory Visit: Payer: Self-pay | Admitting: Nurse Practitioner

## 2021-04-30 ENCOUNTER — Ambulatory Visit (INDEPENDENT_AMBULATORY_CARE_PROVIDER_SITE_OTHER): Payer: Medicare HMO

## 2021-04-30 ENCOUNTER — Ambulatory Visit (INDEPENDENT_AMBULATORY_CARE_PROVIDER_SITE_OTHER): Payer: Medicare HMO | Admitting: Nurse Practitioner

## 2021-04-30 ENCOUNTER — Encounter: Payer: Self-pay | Admitting: Nurse Practitioner

## 2021-04-30 ENCOUNTER — Other Ambulatory Visit: Payer: Self-pay

## 2021-04-30 VITALS — BP 132/80 | HR 57 | Temp 97.0°F | Resp 20 | Ht 65.0 in | Wt 176.0 lb

## 2021-04-30 DIAGNOSIS — M85852 Other specified disorders of bone density and structure, left thigh: Secondary | ICD-10-CM | POA: Diagnosis not present

## 2021-04-30 DIAGNOSIS — K219 Gastro-esophageal reflux disease without esophagitis: Secondary | ICD-10-CM

## 2021-04-30 DIAGNOSIS — M7122 Synovial cyst of popliteal space [Baker], left knee: Secondary | ICD-10-CM | POA: Diagnosis not present

## 2021-04-30 DIAGNOSIS — Z780 Asymptomatic menopausal state: Secondary | ICD-10-CM

## 2021-04-30 DIAGNOSIS — G2581 Restless legs syndrome: Secondary | ICD-10-CM | POA: Diagnosis not present

## 2021-04-30 DIAGNOSIS — Z0001 Encounter for general adult medical examination with abnormal findings: Secondary | ICD-10-CM | POA: Diagnosis not present

## 2021-04-30 DIAGNOSIS — I10 Essential (primary) hypertension: Secondary | ICD-10-CM | POA: Diagnosis not present

## 2021-04-30 DIAGNOSIS — Z23 Encounter for immunization: Secondary | ICD-10-CM

## 2021-04-30 DIAGNOSIS — Z13820 Encounter for screening for osteoporosis: Secondary | ICD-10-CM

## 2021-04-30 DIAGNOSIS — F411 Generalized anxiety disorder: Secondary | ICD-10-CM

## 2021-04-30 DIAGNOSIS — E785 Hyperlipidemia, unspecified: Secondary | ICD-10-CM

## 2021-04-30 DIAGNOSIS — Z6829 Body mass index (BMI) 29.0-29.9, adult: Secondary | ICD-10-CM | POA: Diagnosis not present

## 2021-04-30 MED ORDER — TRAMADOL HCL 50 MG PO TABS: 50.0000 mg | ORAL_TABLET | Freq: Four times a day (QID) | ORAL | 1 refills | Status: AC | PRN

## 2021-04-30 MED ORDER — OMEPRAZOLE 40 MG PO CPDR: 40.0000 mg | DELAYED_RELEASE_CAPSULE | Freq: Every day | ORAL | 1 refills | Status: DC

## 2021-04-30 MED ORDER — METOPROLOL SUCCINATE ER 25 MG PO TB24: 25.0000 mg | ORAL_TABLET | Freq: Every day | ORAL | 1 refills | Status: DC

## 2021-04-30 MED ORDER — ESCITALOPRAM OXALATE 10 MG PO TABS: 10.0000 mg | ORAL_TABLET | Freq: Every day | ORAL | 1 refills | Status: DC

## 2021-04-30 MED ORDER — SIMVASTATIN 40 MG PO TABS: ORAL_TABLET | ORAL | 1 refills | Status: DC

## 2021-04-30 MED ORDER — PRAMIPEXOLE DIHYDROCHLORIDE 0.25 MG PO TABS: ORAL_TABLET | ORAL | 1 refills | Status: DC

## 2021-04-30 NOTE — Addendum Note (Signed)
Addended by: Rolena Infante on: 04/30/2021 04:20 PM   Modules accepted: Orders

## 2021-04-30 NOTE — Progress Notes (Signed)
Subjective:    Patient ID: Amanda Park, female    DOB: [birth year], 75 y.o.   MRN: [id number]   Chief Complaint: Annual Exam    HPI:  1. Essential hypertension, benign No c/o chest pain, sob or headache. Does not check blood pressure at home. BP Readings from Last 3 Encounters:  04/30/21 (!) 148/72  11/15/20 (!) 142/69  10/28/20 129/60     2. Hyperlipidemia with target LDL less than 100 Does try to watchdiet , but has not been doing much exercise. Lab Results  Component Value Date   CHOL 147 10/28/2020   HDL 44 10/28/2020   LDLCALC 80 10/28/2020   TRIG 130 10/28/2020   CHOLHDL 3.3 10/28/2020     3. Gastroesophageal reflux disease without esophagitis Is on omperazole daily and that is working well for her.  4.  GAD Is on lexapro and that helps. Sh eis under a lot of stress because her niece was murdered a couple of months ago. GAD 7 : Generalized Anxiety Score 04/30/2021  Nervous, Anxious, on Edge 0  Control/stop worrying 3  Worry too much - different things 3  Trouble relaxing 0  Restless 0  Easily annoyed or irritable 3  Afraid - awful might happen 0  Total GAD 7 Score 9  Anxiety Difficulty Not difficult at all      5. RLS (restless legs syndrome) Is on mirapex and is doing well.  5. BMI 29.0-29.9,adult No recent weight changes Wt Readings from Last 3 Encounters:  04/30/21 176 lb (79.8 kg)  11/26/20 178 lb (80.7 kg)  11/15/20 178 lb (80.7 kg)   BMI Readings from Last 3 Encounters:  04/30/21 29.29 kg/m  11/26/20 29.62 kg/m  11/15/20 29.62 kg/m       Outpatient Encounter Medications as of 04/30/2021  Medication Sig   aspirin EC 81 MG tablet Take 81 mg by mouth daily.   cetirizine (ZYRTEC) 10 MG tablet Take 1 tablet (10 mg total) by mouth daily.   escitalopram (LEXAPRO) 10 MG tablet Take 1 tablet (10 mg total) by mouth daily.   ibuprofen (ADVIL) 200 MG tablet Take 400 mg by mouth every other day.   levocetirizine (XYZAL) 5 MG tablet Take 5  mg by mouth every evening.   melatonin 5 MG TABS Take 5 mg by mouth.   metoprolol succinate (TOPROL-XL) 25 MG 24 hr tablet Take 1 tablet (25 mg total) by mouth daily.   omeprazole (PRILOSEC) 40 MG capsule Take 1 capsule (40 mg total) by mouth daily.   pramipexole (MIRAPEX) 0.25 MG tablet TAKE 1 TABLET AT BEDTIME   simvastatin (ZOCOR) 40 MG tablet TAKE 1 TABLET EVERY DAY   traMADol (ULTRAM) 50 MG tablet Ultram 50 mg tablet  Take 1 tablet every 4-6 hours by oral route.   No facility-administered encounter medications on file as of 04/30/2021.    Past Surgical History:  Procedure Laterality Date   ABDOMINAL HYSTERECTOMY     cyst removed from left arm pit  03/15/2017   FOOT SURGERY     TMJ ARTHROPLASTY      Family History  Problem Relation Age of Onset   Cancer Mother 39       cancer   Hypertension Mother    Kidney disease Mother    Dementia Mother    Heart disease Mother    Breast cancer Mother    Heart disease Paternal Grandfather    Prostate cancer Father    Prostate cancer Brother    Stomach  cancer Paternal Uncle    Kidney disease Sister    Parkinson's disease Sister    Diabetes Sister    Heart disease Sister    Dementia Sister    Colon cancer Neg Hx     New complaints: Has bakers cyst in back of left knee. Has had it drained a couple of times. Causes pain if she is up on her legs a lot.  Social history: Lives with her husband  Controlled substance contract: n/a      Review of Systems  Constitutional:  Negative for diaphoresis.  Eyes:  Negative for pain.  Respiratory:  Negative for shortness of breath.   Cardiovascular:  Negative for chest pain, palpitations and leg swelling.  Gastrointestinal:  Negative for abdominal pain.  Endocrine: Negative for polydipsia.  Skin:  Negative for rash.  Neurological:  Negative for dizziness, weakness and headaches.  Hematological:  Does not bruise/bleed easily.  All other systems reviewed and are negative.      Objective:   Physical Exam Vitals and nursing note reviewed.  Constitutional:      General: She is not in acute distress.    Appearance: Normal appearance. She is well-developed.  HENT:     Head: Normocephalic.     Right Ear: Tympanic membrane normal.     Left Ear: Tympanic membrane normal.     Nose: Nose normal.     Mouth/Throat:     Mouth: Mucous membranes are moist.  Eyes:     Pupils: Pupils are equal, round, and reactive to light.  Neck:     Vascular: No carotid bruit or JVD.  Cardiovascular:     Rate and Rhythm: Normal rate and regular rhythm.     Heart sounds: Normal heart sounds.  Pulmonary:     Effort: Pulmonary effort is normal. No respiratory distress.     Breath sounds: Normal breath sounds. No wheezing or rales.  Chest:     Chest wall: No tenderness.  Abdominal:     General: Bowel sounds are normal. There is no distension or abdominal bruit.     Palpations: Abdomen is soft. There is no hepatomegaly, splenomegaly, mass or pulsatile mass.     Tenderness: There is no abdominal tenderness.  Musculoskeletal:        General: Normal range of motion.     Cervical back: Normal range of motion and neck supple.  Lymphadenopathy:     Cervical: No cervical adenopathy.  Skin:    General: Skin is warm and dry.  Neurological:     Mental Status: She is alert and oriented to person, place, and time.     Deep Tendon Reflexes: Reflexes are normal and symmetric.  Psychiatric:        Behavior: Behavior normal.        Thought Content: Thought content normal.        Judgment: Judgment normal.    BP 132/80   Pulse (!) 57   Temp (!) 97 F (36.1 C) (Temporal)   Resp 20   Ht 5\' 5"  (1.651 m)   Wt 176 lb (79.8 kg)   SpO2 97%   BMI 29.29 kg/m         Assessment & Plan:  Amanda Park comes in today with chief complaint of Annual Exam   Diagnosis and orders addressed:  1. Primary hypertension Low sodium diet - metoprolol succinate (TOPROL-XL) 25 MG 24 hr tablet; Take  1 tablet (25 mg total) by mouth daily.  Dispense: 90 tablet; Refill:  1   2. Hyperlipidemia with target LDL less than 100 Low fat diet - simvastatin (ZOCOR) 40 MG tablet; TAKE 1 TABLET EVERY DAY  Dispense: 90 tablet; Refill: 1  3. Gastroesophageal reflux disease without esophagitis Avoid spicy foods Do not eat 2 hours prior to bedtime - omeprazole (PRILOSEC) 40 MG capsule; Take 1 capsule (40 mg total) by mouth daily.  Dispense: 90 capsule; Refill: 1  4. RLS (restless legs syndrome) Keep legs warm at night - pramipexole (MIRAPEX) 0.25 MG tablet; TAKE 1 TABLET AT BEDTIME  Dispense: 90 tablet; Refill: 1  5. BMI 29.0-29.9,adult Discussed diet and exercise for person with BMI >25 Will recheck weight in 3-6 months   6.  GAD (generalized anxiety disorder) Stress management - escitalopram (LEXAPRO) 10 MG tablet; Take 1 tablet (10 mg total) by mouth daily.  Dispense: 90 tablet; Refill: 1  7. Baker's cyst of knee, left Only use pain meds as needed - traMADol (ULTRAM) 50 MG tablet; Take 1 tablet (50 mg total) by mouth every 6 (six) hours as needed for up to 7 days.  Dispense: 30 tablet; Refill: 1   Labs pending Health Maintenance reviewed Diet and exercise encouraged  Follow up plan: 6 months   Mary-Margaret Hassell Done, FNP

## 2021-04-30 NOTE — Patient Instructions (Signed)
Complicated Grief Grief is a normal response to the death of someone close to you. Feelings of fear, anger, and guilt can affect almost everyone who loses a loved one. It is also common to have symptoms of depression while you are grieving. These include problems with sleep, loss of appetite, and lack of energy. They may last for weeks or months after a loss. Complicated grief is different from normal grief or depression. Normal grieving involves sadness and feelings of loss, but those feelings get better and heal over time. Complicated grief is a severe type of grief that lasts for a long time, usually for several months to a year or longer. It interferes with your ability to function normally. Complicated grief may require treatment from a mental health care provider. What are the causes? The cause of this condition is not known. It is not clear why some people continue to struggle with grief and others do not. What increases the risk? You are more likely to develop this condition if: The death of your loved one was sudden or unexpected. The death of your loved one was due to a violent event. Your loved one died from suicide. Your loved one was a child or a young person. You were very close to your loved one, or you were dependent on him or her. You have a history of depression or anxiety. What are the signs or symptoms? Symptoms of this condition include: Feeling disbelief or having a lack of emotion (numbness). Being unable to enjoy good memories of your loved one. Needing to avoid anything or anyone that reminds you of your loved one. Being unable to stop thinking about the death. Feeling intense anger or guilt. Feeling alone and hopeless. Feeling that your life is meaningless and empty. Losing the desire to move on with your life. How is this diagnosed? This condition may be diagnosed based on: Your symptoms. Complicated grief will be diagnosed if you have ongoing symptoms of grief for  6-12 months or longer. The effect of symptoms on your life. You may be diagnosed with this condition if your symptoms are interfering with your ability to live your life. Your health care provider may recommend that you see a mental health care provider. Many symptoms of depression are similar to the symptoms of complicated grief. It is important to be evaluated for complicated grief along with other mental health conditions. How is this treated? This condition is most commonly treated with talk therapy. This therapy is offered by a mental health specialist (psychiatrist). During therapy: You will learn healthy ways to cope with the loss of your loved one. Your mental health care provider may recommend antidepressant medicines. Follow these instructions at home: Lifestyle  Take care of yourself. Eat on a regular basis, and maintain a healthy diet. Eat plenty of fruits, vegetables, lean protein, and whole grains. Try to get some exercise each day. Aim for 30 minutes of exercise on most days of the week. Keep a consistent sleep schedule. Try to get 8 or more hours of sleep each night. Start doing the things that you used to enjoy. Do not use drugs or alcohol to ease your symptoms. Spend time with friends and loved ones. General instructions Take over-the-counter and prescription medicines only as told by your health care provider. Consider joining a grief (bereavement) support group to help you deal with your loss. Keep all follow-up visits as told by your health care provider. This is important. Contact a health care provider if:  Your symptoms prevent you from functioning normally. Your symptoms do not get better with treatment. Get help right away if: You have serious thoughts about hurting yourself or someone else. You have suicidal feelings. If you ever feel like you may hurt yourself or others, or have thoughts about taking your own life, get help right away. You can go to your nearest  emergency department or call: Your local emergency services (911 in the [place] A suicide crisis helpline, such as the [place] at 5344596504. This is open 24 hours a day. Summary Complicated grief is a severe type of grief that lasts for a long time. This grief is not likely to go away on its own. Get the help you need. Some griefs are more difficult than others and can cause this condition. You may need a certain type of treatment to help you recover if the loss of your loved one was sudden, violent, or due to suicide. You may feel guilty about moving on with your life. Getting help does not mean that you are forgetting your loved one. It means that you are taking care of yourself. Complicated grief is best treated with talk therapy. Medicines may also be prescribed. Seek the help you need, and find support that will help you recover. This information is not intended to replace advice given to you by your health care provider. Make sure you discuss any questions you have with your health care provider. Document Revised: 01/04/2020 Document Reviewed: 01/04/2020 Elsevier Patient Education  [place].

## 2021-05-01 LAB — CMP14+EGFR
ALT: 18 IU/L (ref 0–32)
AST: 18 IU/L (ref 0–40)
Albumin/Globulin Ratio: 1.9 (ref 1.2–2.2)
Albumin: 4.5 g/dL (ref 3.7–4.7)
Alkaline Phosphatase: 62 IU/L (ref 44–121)
BUN/Creatinine Ratio: 12 (ref 12–28)
BUN: 13 mg/dL (ref 8–27)
Bilirubin Total: 0.4 mg/dL (ref 0.0–1.2)
CO2: 22 mmol/L (ref 20–29)
Calcium: 9.4 mg/dL (ref 8.7–10.3)
Chloride: 107 mmol/L — ABNORMAL HIGH (ref 96–106)
Creatinine, Ser: 1.05 mg/dL — ABNORMAL HIGH (ref 0.57–1.00)
Globulin, Total: 2.4 g/dL (ref 1.5–4.5)
Glucose: 102 mg/dL — ABNORMAL HIGH (ref 70–99)
Potassium: 4.5 mmol/L (ref 3.5–5.2)
Sodium: 146 mmol/L — ABNORMAL HIGH (ref 134–144)
Total Protein: 6.9 g/dL (ref 6.0–8.5)
eGFR: 55 mL/min/{1.73_m2} — ABNORMAL LOW (ref >59)

## 2021-05-01 LAB — CBC WITH DIFFERENTIAL/PLATELET
Basophils Absolute: 0 10*3/uL (ref 0.0–0.2)
Basos: 1 %
EOS (ABSOLUTE): 0.1 10*3/uL (ref 0.0–0.4)
Eos: 1 %
Hematocrit: 41 % (ref 34.0–46.6)
Hemoglobin: 13.5 g/dL (ref 11.1–15.9)
Immature Grans (Abs): 0 10*3/uL (ref 0.0–0.1)
Immature Granulocytes: 0 %
Lymphocytes Absolute: 1.6 10*3/uL (ref 0.7–3.1)
Lymphs: 28 %
MCH: 29 pg (ref 26.6–33.0)
MCHC: 32.9 g/dL (ref 31.5–35.7)
MCV: 88 fL (ref 79–97)
Monocytes Absolute: 0.2 10*3/uL (ref 0.1–0.9)
Monocytes: 4 %
Neutrophils Absolute: 3.8 10*3/uL (ref 1.4–7.0)
Neutrophils: 66 %
Platelets: 185 10*3/uL (ref 150–450)
RBC: 4.65 x10E6/uL (ref 3.77–5.28)
RDW: 12.7 % (ref 11.7–15.4)
WBC: 5.7 10*3/uL (ref 3.4–10.8)

## 2021-05-01 LAB — LIPID PANEL
Chol/HDL Ratio: 3.2 ratio (ref 0.0–4.4)
Cholesterol, Total: 152 mg/dL (ref 100–199)
HDL: 48 mg/dL (ref >39)
LDL Chol Calc (NIH): 82 mg/dL (ref 0–99)
Triglycerides: 124 mg/dL (ref 0–149)
VLDL Cholesterol Cal: 22 mg/dL (ref 5–40)

## 2021-07-24 ENCOUNTER — Ambulatory Visit (INDEPENDENT_AMBULATORY_CARE_PROVIDER_SITE_OTHER): Payer: Medicare HMO | Admitting: Nurse Practitioner

## 2021-07-24 ENCOUNTER — Other Ambulatory Visit: Payer: Self-pay

## 2021-07-24 ENCOUNTER — Encounter: Payer: Self-pay | Admitting: Nurse Practitioner

## 2021-07-24 VITALS — BP 126/63 | HR 65 | Temp 95.9°F | Ht 65.0 in | Wt 174.8 lb

## 2021-07-24 DIAGNOSIS — M5442 Lumbago with sciatica, left side: Secondary | ICD-10-CM

## 2021-07-24 MED ORDER — METHYLPREDNISOLONE ACETATE 80 MG/ML IJ SUSP
80.0000 mg | Freq: Once | INTRAMUSCULAR | Status: AC
  Administered 2021-07-24: 09:00:00 80 mg via INTRAMUSCULAR

## 2021-07-24 MED ORDER — KETOROLAC TROMETHAMINE 60 MG/2ML IM SOLN
60.0000 mg | Freq: Once | INTRAMUSCULAR | Status: AC
  Administered 2021-07-24: 09:00:00 60 mg via INTRAMUSCULAR

## 2021-07-24 MED ORDER — PREDNISONE 20 MG PO TABS: ORAL_TABLET | ORAL | 0 refills | Status: DC

## 2021-07-24 NOTE — Patient Instructions (Signed)

## 2021-07-24 NOTE — Progress Notes (Signed)
° °  Subjective:    Patient ID: Amanda Park, female    DOB: 1945/12/25, 75 y.o.   MRN: [id number]   Chief Complaint: Sciatica (Left side- x 4 days)   HPI Patient come sin today c/o left low back pain that radiates down her left leg. Rates pain 10/10. Standing makes it some better and laying flat helps. Sitting increases pain.     Review of Systems  Constitutional:  Negative for diaphoresis.  Eyes:  Negative for pain.  Respiratory:  Negative for shortness of breath.   Cardiovascular:  Negative for chest pain, palpitations and leg swelling.  Gastrointestinal:  Negative for abdominal pain.  Endocrine: Negative for polydipsia.  Musculoskeletal:  Positive for back pain.  Skin:  Negative for rash.  Neurological:  Negative for dizziness, weakness and headaches.  Hematological:  Does not bruise/bleed easily.  All other systems reviewed and are negative.     Objective:   Physical Exam Vitals reviewed.  Constitutional:      Appearance: Normal appearance.  Cardiovascular:     Rate and Rhythm: Normal rate and regular rhythm.  Pulmonary:     Breath sounds: Normal breath sounds.  Musculoskeletal:     Comments: Left low back pain with extension of flexion of lumbar spine.  (-) SLR bil Patient cannot sit on exam table due to pain  Skin:    General: Skin is warm.  Neurological:     General: No focal deficit present.     Mental Status: She is alert and oriented to person, place, and time.  Psychiatric:        Mood and Affect: Mood normal.        Behavior: Behavior normal.    BP 126/63    Pulse 65    Temp (!) 95.9 F (35.5 C) (Temporal)    Ht 5\' 5"  (1.651 m)    Wt 174 lb 12.8 oz (79.3 kg)    SpO2 93%    BMI 29.09 kg/m        Assessment & Plan:  Amanda Park in today with chief complaint of Sciatica (Left side- x 4 days)   1. Acute left-sided low back pain with left-sided sciatica Moist heat Stretches when able RTO prn - ketorolac (TORADOL) injection 60 mg -  methylPREDNISolone acetate (DEPO-MEDROL) injection 80 mg - predniSONE (DELTASONE) 20 MG tablet; 2 po at sametime daily for 5 days-  Dispense: 10 tablet; Refill: 0    The above assessment and management plan was discussed with the patient. The patient verbalized understanding of and has agreed to the management plan. Patient is aware to call the clinic if symptoms persist or worsen. Patient is aware when to return to the clinic for a follow-up visit. Patient educated on when it is appropriate to go to the emergency department.   Mary-Margaret Hassell Done, FNP

## 2021-07-25 ENCOUNTER — Other Ambulatory Visit: Payer: Self-pay | Admitting: Nurse Practitioner

## 2021-07-25 DIAGNOSIS — Z1231 Encounter for screening mammogram for malignant neoplasm of breast: Secondary | ICD-10-CM

## 2021-07-27 DIAGNOSIS — C50919 Malignant neoplasm of unspecified site of unspecified female breast: Secondary | ICD-10-CM

## 2021-07-27 HISTORY — DX: Malignant neoplasm of unspecified site of unspecified female breast: C50.919

## 2021-10-01 ENCOUNTER — Ambulatory Visit
Admission: RE | Admit: 2021-10-01 | Discharge: 2021-10-01 | Disposition: A | Discharge status: Home / Self Care | Payer: Medicare HMO | Source: Ambulatory Visit | Attending: Nurse Practitioner | Admitting: Nurse Practitioner

## 2021-10-01 ENCOUNTER — Other Ambulatory Visit: Payer: Self-pay

## 2021-10-01 DIAGNOSIS — Z1231 Encounter for screening mammogram for malignant neoplasm of breast: Secondary | ICD-10-CM

## 2021-10-20 ENCOUNTER — Other Ambulatory Visit: Payer: Self-pay

## 2021-10-20 ENCOUNTER — Ambulatory Visit
Admission: RE | Admit: 2021-10-20 | Discharge: 2021-10-20 | Disposition: A | Discharge status: Home / Self Care | Payer: Medicare HMO | Source: Ambulatory Visit | Attending: Nurse Practitioner | Admitting: Nurse Practitioner

## 2021-10-20 DIAGNOSIS — Z1231 Encounter for screening mammogram for malignant neoplasm of breast: Secondary | ICD-10-CM | POA: Diagnosis not present

## 2021-10-22 ENCOUNTER — Other Ambulatory Visit: Payer: Self-pay

## 2021-10-22 DIAGNOSIS — R928 Other abnormal and inconclusive findings on diagnostic imaging of breast: Secondary | ICD-10-CM

## 2021-10-22 DIAGNOSIS — N6489 Other specified disorders of breast: Secondary | ICD-10-CM

## 2021-10-23 ENCOUNTER — Telehealth: Payer: Self-pay | Admitting: Nurse Practitioner

## 2021-10-23 NOTE — Telephone Encounter (Signed)
REFERRAL REQUEST ?Telephone Note ? ?Have you been seen at our office for this problem? yes ?(Advise that they may need an appointment with their PCP before a referral can be done) ? ?Reason for Referral: Mammogram ?Referral discussed with patient: yes  ?Best contact number of patient for referral team: [phone]    ?Has patient been seen by a specialist for this issue before: yes  ?Patient provider preference for referral: yes ?Patient location preference for referral: [place] ?  ?Patient notified that referrals can take up to a week or longer to process. If they haven't heard anything within a week they should call back and speak with the referral department.   ? ?MMM's pt. ? ?She is still waiting to schedule a Mammogram at [facility] in [place]. ?

## 2021-10-30 ENCOUNTER — Ambulatory Visit (INDEPENDENT_AMBULATORY_CARE_PROVIDER_SITE_OTHER): Payer: Medicare HMO | Admitting: Nurse Practitioner

## 2021-10-30 ENCOUNTER — Ambulatory Visit (INDEPENDENT_AMBULATORY_CARE_PROVIDER_SITE_OTHER): Payer: Medicare HMO

## 2021-10-30 ENCOUNTER — Encounter: Payer: Self-pay | Admitting: Nurse Practitioner

## 2021-10-30 VITALS — BP 136/78 | HR 53 | Temp 97.1°F | Resp 20 | Ht 65.0 in | Wt 174.0 lb

## 2021-10-30 DIAGNOSIS — M25562 Pain in left knee: Secondary | ICD-10-CM

## 2021-10-30 DIAGNOSIS — K219 Gastro-esophageal reflux disease without esophagitis: Secondary | ICD-10-CM

## 2021-10-30 DIAGNOSIS — G2581 Restless legs syndrome: Secondary | ICD-10-CM | POA: Diagnosis not present

## 2021-10-30 DIAGNOSIS — I10 Essential (primary) hypertension: Secondary | ICD-10-CM

## 2021-10-30 DIAGNOSIS — F411 Generalized anxiety disorder: Secondary | ICD-10-CM

## 2021-10-30 DIAGNOSIS — E785 Hyperlipidemia, unspecified: Secondary | ICD-10-CM | POA: Diagnosis not present

## 2021-10-30 DIAGNOSIS — Z6829 Body mass index (BMI) 29.0-29.9, adult: Secondary | ICD-10-CM | POA: Diagnosis not present

## 2021-10-30 DIAGNOSIS — Z23 Encounter for immunization: Secondary | ICD-10-CM

## 2021-10-30 MED ORDER — TRAMADOL HCL 50 MG PO TABS: 50.0000 mg | ORAL_TABLET | Freq: Four times a day (QID) | ORAL | 0 refills | Status: DC | PRN

## 2021-10-30 MED ORDER — ESCITALOPRAM OXALATE 10 MG PO TABS: 10.0000 mg | ORAL_TABLET | Freq: Every day | ORAL | 1 refills | Status: DC

## 2021-10-30 MED ORDER — PRAMIPEXOLE DIHYDROCHLORIDE 0.25 MG PO TABS: ORAL_TABLET | ORAL | 1 refills | Status: DC

## 2021-10-30 MED ORDER — SIMVASTATIN 40 MG PO TABS: ORAL_TABLET | ORAL | 1 refills | Status: DC

## 2021-10-30 MED ORDER — OMEPRAZOLE 40 MG PO CPDR: 40.0000 mg | DELAYED_RELEASE_CAPSULE | Freq: Every day | ORAL | 1 refills | Status: DC

## 2021-10-30 MED ORDER — METOPROLOL SUCCINATE ER 25 MG PO TB24: 25.0000 mg | ORAL_TABLET | Freq: Every day | ORAL | 1 refills | Status: DC

## 2021-10-30 NOTE — Progress Notes (Signed)
? ?Subjective:  ? ? Patient ID: Amanda Park, female    DOB: 1945-09-21, 76 y.o.   MRN: [id number] ? ? ?Chief Complaint: medical management of chronic issues  ?  ? ?HPI: ? ?Amanda Park is a 76 y.o. who identifies as a female who was assigned female at birth.  ? ?Social history: ?Lives with: husband ?Work history: retired ? ? ?Comes in today for follow up of the following chronic medical issues: ? ?1. Essential hypertension, benign ?No c/o chest pain, sob or headache. Does not check blood pressure at home ?BP Readings from Last 3 Encounters:  ?07/24/21 126/63  ?04/30/21 132/80  ?11/15/20 (!) 142/69  ? ? ? ?2. Hyperlipidemia with target LDL less than 100 ?Does try to watch diet but does no dedicated exercise. ?Lab Results  ?Component Value Date  ? CHOL 152 04/30/2021  ? HDL 48 04/30/2021  ? [place] 82 04/30/2021  ? TRIG 124 04/30/2021  ? CHOLHDL 3.2 04/30/2021  ? ? ? ?3. Gastroesophageal reflux disease without esophagitis ?Is on omeprazole daily and is doing well. ? ?4. RLS (restless legs syndrome) ?Is on mirapex daily and is doing well.  ? ?5. BMI 29.0-29.9,adult ?No recent weight changes ?Wt Readings from Last 3 Encounters:  ?10/30/21 174 lb (78.9 kg)  ?07/24/21 174 lb 12.8 oz (79.3 kg)  ?04/30/21 176 lb (79.8 kg)  ? ?BMI Readings from Last 3 Encounters:  ?10/30/21 28.96 kg/m?  ?07/24/21 29.09 kg/m?  ?04/30/21 29.29 kg/m?  ? ? ? ? ?New complaints: ?None today ? ?No Known Allergies ?Outpatient Encounter Medications as of 10/30/2021  ?Medication Sig  ? aspirin EC 81 MG tablet Take 81 mg by mouth daily.  ? cetirizine (ZYRTEC) 10 MG tablet Take 1 tablet (10 mg total) by mouth daily.  ? escitalopram (LEXAPRO) 10 MG tablet Take 1 tablet (10 mg total) by mouth daily.  ? ibuprofen (ADVIL) 200 MG tablet Take 400 mg by mouth every other day.  ? levocetirizine (XYZAL) 5 MG tablet Take 5 mg by mouth every evening.  ? melatonin 5 MG TABS Take 5 mg by mouth.  ? metoprolol succinate (TOPROL-XL) 25 MG 24 hr tablet Take 1 tablet (25  mg total) by mouth daily.  ? omeprazole (PRILOSEC) 40 MG capsule Take 1 capsule (40 mg total) by mouth daily.  ? pramipexole (MIRAPEX) 0.25 MG tablet TAKE 1 TABLET AT BEDTIME  ? predniSONE (DELTASONE) 20 MG tablet 2 po at sametime daily for 5 days-  ? simvastatin (ZOCOR) 40 MG tablet TAKE 1 TABLET EVERY DAY  ? traMADol (ULTRAM) 50 MG tablet Take by mouth every 6 (six) hours as needed.  ? ?No facility-administered encounter medications on file as of 10/30/2021.  ? ? ?Past Surgical History:  ?Procedure Laterality Date  ? ABDOMINAL HYSTERECTOMY    ? BREAST BIOPSY Bilateral 10/24/2020  ? Fibroadenoma bilaterally  ? cyst removed from left arm pit  03/15/2017  ? FOOT SURGERY    ? TMJ ARTHROPLASTY    ? ? ?Family History  ?Problem Relation Age of Onset  ? Cancer Mother 75  ?     cancer  ? Hypertension Mother   ? Kidney disease Mother   ? Dementia Mother   ? Heart disease Mother   ? Breast cancer Mother   ? Heart disease Paternal Grandfather   ? Prostate cancer Father   ? Prostate cancer Brother   ? Stomach cancer Paternal Uncle   ? Kidney disease Sister   ? Parkinson's disease Sister   ?  Diabetes Sister   ? Heart disease Sister   ? Dementia Sister   ? Colon cancer Neg Hx   ? ? ? ? ?Controlled substance contract: n/a- only takes PRN ? ? ? ? ?Review of Systems  ?Constitutional:  Negative for diaphoresis.  ?Eyes:  Negative for pain.  ?Respiratory:  Negative for shortness of breath.   ?Cardiovascular:  Negative for chest pain, palpitations and leg swelling.  ?Gastrointestinal:  Negative for abdominal pain.  ?Endocrine: Negative for polydipsia.  ?Skin:  Negative for rash.  ?Neurological:  Negative for dizziness, weakness and headaches.  ?Hematological:  Does not bruise/bleed easily.  ?All other systems reviewed and are negative. ? ?   ?Objective:  ? Physical Exam ?Vitals and nursing note reviewed.  ?Constitutional:   ?   General: She is not in acute distress. ?   Appearance: Normal appearance. She is well-developed.  ?HENT:  ?    Head: Normocephalic.  ?   Right Ear: Tympanic membrane normal.  ?   Left Ear: Tympanic membrane normal.  ?   Nose: Nose normal.  ?   Mouth/Throat:  ?   Mouth: Mucous membranes are moist.  ?Eyes:  ?   Pupils: Pupils are equal, round, and reactive to light.  ?Neck:  ?   Vascular: No carotid bruit or JVD.  ?Cardiovascular:  ?   Rate and Rhythm: Normal rate and regular rhythm.  ?   Heart sounds: Normal heart sounds.  ?Pulmonary:  ?   Effort: Pulmonary effort is normal. No respiratory distress.  ?   Breath sounds: Normal breath sounds. No wheezing or rales.  ?Chest:  ?   Chest wall: No tenderness.  ?Abdominal:  ?   General: Bowel sounds are normal. There is no distension or abdominal bruit.  ?   Palpations: Abdomen is soft. There is no hepatomegaly, splenomegaly, mass or pulsatile mass.  ?   Tenderness: There is no abdominal tenderness.  ?Musculoskeletal:     ?   General: Normal range of motion.  ?   Cervical back: Normal range of motion and neck supple.  ?   Comments: Left knee effusion with pain on flexion and extension ?No patella tenderness  ?Lymphadenopathy:  ?   Cervical: No cervical adenopathy.  ?Skin: ?   General: Skin is warm and dry.  ?Neurological:  ?   Mental Status: She is alert and oriented to person, place, and time.  ?   Deep Tendon Reflexes: Reflexes are normal and symmetric.  ?Psychiatric:     ?   Behavior: Behavior normal.     ?   Thought Content: Thought content normal.     ?   Judgment: Judgment normal.  ? ? ?BP 136/78   Pulse (!) 53   Temp (!) 97.1 ?F (36.2 ?C) (Temporal)   Resp 20   Ht 5' 5"  (1.651 m)   Wt 174 lb (78.9 kg)   SpO2 95%   BMI 28.96 kg/m?  ? ? ?Left knee xray- mild osteoarthritis-Preliminary reading by Ronnald Collum, FNP  [facility] ? ? ?   ?Assessment & Plan:  ? ?Carol Theys comes in today with chief complaint of Medical Management of Chronic Issues (Left swollen and painful/) ? ? ?Diagnosis and orders addressed: ? ?1. Essential hypertension, benign ?Low sodium diet ?- CBC with  Differential/Platelet ?- CMP14+EGFR ? ?2. Hyperlipidemia with target LDL less than 100 ?Low fat diet ?- Lipid panel ? ?3. Gastroesophageal reflux disease without esophagitis ?Avoid spicy foods ?Do not eat 2 hours  prior to bedtime ? ?4. RLS (restless legs syndrome) ?Keep legs warm ? ?5. BMI 29.0-29.9,adult ?Discussed diet and exercise for person with BMI >25 ?Will recheck weight in 3-6 months ? ? ?6. Acute pain of left knee ?Ice bid ?wrap ?- DG Knee 1-2 Views Left ?- Ambulatory referral to Orthopedic Surgery ? ?Meds ordered this encounter  ?Medications  ? escitalopram (LEXAPRO) 10 MG tablet  ?  Sig: Take 1 tablet (10 mg total) by mouth daily.  ?  Dispense:  90 tablet  ?  Refill:  1  ?  Order Specific Question:   Supervising Provider  ?  Answer:   Caryl Pina A [[id number]]  ? omeprazole (PRILOSEC) 40 MG capsule  ?  Sig: Take 1 capsule (40 mg total) by mouth daily.  ?  Dispense:  90 capsule  ?  Refill:  1  ?  Order Specific Question:   Supervising Provider  ?  Answer:   Caryl Pina A [[id number]]  ? simvastatin (ZOCOR) 40 MG tablet  ?  Sig: TAKE 1 TABLET EVERY DAY  ?  Dispense:  90 tablet  ?  Refill:  1  ?  Order Specific Question:   Supervising Provider  ?  Answer:   Caryl Pina A [[id number]]  ? metoprolol succinate (TOPROL-XL) 25 MG 24 hr tablet  ?  Sig: Take 1 tablet (25 mg total) by mouth daily.  ?  Dispense:  90 tablet  ?  Refill:  1  ?  Order Specific Question:   Supervising Provider  ?  Answer:   Caryl Pina A [[id number]]  ? pramipexole (MIRAPEX) 0.25 MG tablet  ?  Sig: TAKE 1 TABLET AT BEDTIME  ?  Dispense:  90 tablet  ?  Refill:  1  ?  Order Specific Question:   Supervising Provider  ?  Answer:   Caryl Pina A [[id number]]  ? ? ?Labs pending ?Health Maintenance reviewed ?Diet and exercise encouraged ? ?Follow up plan: ?6 months ? ? ?Mary-Margaret Hassell Done, FNP ? ?

## 2021-10-31 LAB — CMP14+EGFR
ALT: 15 IU/L (ref 0–32)
AST: 15 IU/L (ref 0–40)
Albumin/Globulin Ratio: 2 (ref 1.2–2.2)
Albumin: 4.2 g/dL (ref 3.7–4.7)
Alkaline Phosphatase: 64 IU/L (ref 44–121)
BUN/Creatinine Ratio: 16 (ref 12–28)
BUN: 17 mg/dL (ref 8–27)
Bilirubin Total: 0.5 mg/dL (ref 0.0–1.2)
CO2: 21 mmol/L (ref 20–29)
Calcium: 8.9 mg/dL (ref 8.7–10.3)
Chloride: 109 mmol/L — ABNORMAL HIGH (ref 96–106)
Creatinine, Ser: 1.04 mg/dL — ABNORMAL HIGH (ref 0.57–1.00)
Globulin, Total: 2.1 g/dL (ref 1.5–4.5)
Glucose: 104 mg/dL — ABNORMAL HIGH (ref 70–99)
Potassium: 4.5 mmol/L (ref 3.5–5.2)
Sodium: 144 mmol/L (ref 134–144)
Total Protein: 6.3 g/dL (ref 6.0–8.5)
eGFR: 56 mL/min/{1.73_m2} — ABNORMAL LOW (ref >59)

## 2021-10-31 LAB — CBC WITH DIFFERENTIAL/PLATELET
Basophils Absolute: 0 10*3/uL (ref 0.0–0.2)
Basos: 1 %
EOS (ABSOLUTE): 0.1 10*3/uL (ref 0.0–0.4)
Eos: 1 %
Hematocrit: 40.1 % (ref 34.0–46.6)
Hemoglobin: 13.2 g/dL (ref 11.1–15.9)
Immature Grans (Abs): 0 10*3/uL (ref 0.0–0.1)
Immature Granulocytes: 0 %
Lymphocytes Absolute: 1.7 10*3/uL (ref 0.7–3.1)
Lymphs: 32 %
MCH: 28.9 pg (ref 26.6–33.0)
MCHC: 32.9 g/dL (ref 31.5–35.7)
MCV: 88 fL (ref 79–97)
Monocytes Absolute: 0.3 10*3/uL (ref 0.1–0.9)
Monocytes: 6 %
Neutrophils Absolute: 3.2 10*3/uL (ref 1.4–7.0)
Neutrophils: 60 %
Platelets: 178 10*3/uL (ref 150–450)
RBC: 4.57 x10E6/uL (ref 3.77–5.28)
RDW: 13.5 % (ref 11.7–15.4)
WBC: 5.4 10*3/uL (ref 3.4–10.8)

## 2021-10-31 LAB — LIPID PANEL
Chol/HDL Ratio: 3.4 ratio (ref 0.0–4.4)
Cholesterol, Total: 157 mg/dL (ref 100–199)
HDL: 46 mg/dL (ref >39)
LDL Chol Calc (NIH): 91 mg/dL (ref 0–99)
Triglycerides: 111 mg/dL (ref 0–149)
VLDL Cholesterol Cal: 20 mg/dL (ref 5–40)

## 2021-11-06 DIAGNOSIS — R922 Inconclusive mammogram: Secondary | ICD-10-CM | POA: Diagnosis not present

## 2021-11-06 DIAGNOSIS — N6321 Unspecified lump in the left breast, upper outer quadrant: Secondary | ICD-10-CM | POA: Diagnosis not present

## 2021-11-13 ENCOUNTER — Telehealth: Payer: Self-pay | Admitting: Nurse Practitioner

## 2021-11-13 NOTE — Telephone Encounter (Signed)
Form faxed this AM

## 2021-11-13 NOTE — Telephone Encounter (Signed)
Please send order

## 2021-11-14 DIAGNOSIS — N6321 Unspecified lump in the left breast, upper outer quadrant: Secondary | ICD-10-CM | POA: Diagnosis not present

## 2021-11-14 DIAGNOSIS — C50412 Malignant neoplasm of upper-outer quadrant of left female breast: Secondary | ICD-10-CM | POA: Diagnosis not present

## 2021-11-14 DIAGNOSIS — Z170 Estrogen receptor positive status [ER+]: Secondary | ICD-10-CM | POA: Diagnosis not present

## 2021-11-14 DIAGNOSIS — R920 Mammographic microcalcification found on diagnostic imaging of breast: Secondary | ICD-10-CM | POA: Diagnosis not present

## 2021-11-18 ENCOUNTER — Other Ambulatory Visit: Payer: Self-pay | Admitting: Nurse Practitioner

## 2021-11-18 DIAGNOSIS — M25562 Pain in left knee: Secondary | ICD-10-CM | POA: Diagnosis not present

## 2021-11-18 MED ORDER — AZITHROMYCIN 250 MG PO TABS: ORAL_TABLET | ORAL | 0 refills | Status: DC

## 2021-11-18 NOTE — Progress Notes (Signed)
Z pak

## 2021-11-24 DIAGNOSIS — Z8042 Family history of malignant neoplasm of prostate: Secondary | ICD-10-CM | POA: Diagnosis not present

## 2021-11-24 DIAGNOSIS — C50912 Malignant neoplasm of unspecified site of left female breast: Secondary | ICD-10-CM | POA: Insufficient documentation

## 2021-11-24 DIAGNOSIS — Z803 Family history of malignant neoplasm of breast: Secondary | ICD-10-CM | POA: Diagnosis not present

## 2021-11-24 DIAGNOSIS — Z808 Family history of malignant neoplasm of other organs or systems: Secondary | ICD-10-CM | POA: Diagnosis not present

## 2021-11-24 DIAGNOSIS — Z800 Family history of malignant neoplasm of digestive organs: Secondary | ICD-10-CM | POA: Diagnosis not present

## 2021-11-24 DIAGNOSIS — Z8041 Family history of malignant neoplasm of ovary: Secondary | ICD-10-CM | POA: Diagnosis not present

## 2021-11-24 DIAGNOSIS — Z780 Asymptomatic menopausal state: Secondary | ICD-10-CM | POA: Diagnosis not present

## 2021-11-24 DIAGNOSIS — Z8481 Family history of carrier of genetic disease: Secondary | ICD-10-CM | POA: Diagnosis not present

## 2021-11-26 DIAGNOSIS — C50919 Malignant neoplasm of unspecified site of unspecified female breast: Secondary | ICD-10-CM | POA: Diagnosis not present

## 2021-11-27 ENCOUNTER — Ambulatory Visit: Payer: Medicare HMO

## 2021-12-12 DIAGNOSIS — Z90710 Acquired absence of both cervix and uterus: Secondary | ICD-10-CM | POA: Diagnosis not present

## 2021-12-12 DIAGNOSIS — K219 Gastro-esophageal reflux disease without esophagitis: Secondary | ICD-10-CM | POA: Diagnosis not present

## 2021-12-12 DIAGNOSIS — N6092 Unspecified benign mammary dysplasia of left breast: Secondary | ICD-10-CM | POA: Diagnosis not present

## 2021-12-12 DIAGNOSIS — I10 Essential (primary) hypertension: Secondary | ICD-10-CM | POA: Diagnosis not present

## 2021-12-12 DIAGNOSIS — E785 Hyperlipidemia, unspecified: Secondary | ICD-10-CM | POA: Diagnosis not present

## 2021-12-12 DIAGNOSIS — Z170 Estrogen receptor positive status [ER+]: Secondary | ICD-10-CM | POA: Diagnosis not present

## 2021-12-12 DIAGNOSIS — Z9049 Acquired absence of other specified parts of digestive tract: Secondary | ICD-10-CM | POA: Diagnosis not present

## 2021-12-12 DIAGNOSIS — D0512 Intraductal carcinoma in situ of left breast: Secondary | ICD-10-CM | POA: Diagnosis not present

## 2021-12-12 DIAGNOSIS — C50912 Malignant neoplasm of unspecified site of left female breast: Secondary | ICD-10-CM | POA: Diagnosis not present

## 2021-12-12 DIAGNOSIS — C50412 Malignant neoplasm of upper-outer quadrant of left female breast: Secondary | ICD-10-CM | POA: Diagnosis not present

## 2021-12-12 DIAGNOSIS — Z803 Family history of malignant neoplasm of breast: Secondary | ICD-10-CM | POA: Diagnosis not present

## 2021-12-26 DIAGNOSIS — Z87898 Personal history of other specified conditions: Secondary | ICD-10-CM | POA: Diagnosis not present

## 2021-12-26 DIAGNOSIS — Z780 Asymptomatic menopausal state: Secondary | ICD-10-CM | POA: Diagnosis not present

## 2021-12-26 DIAGNOSIS — C50912 Malignant neoplasm of unspecified site of left female breast: Secondary | ICD-10-CM | POA: Diagnosis not present

## 2022-01-08 DIAGNOSIS — Z170 Estrogen receptor positive status [ER+]: Secondary | ICD-10-CM | POA: Diagnosis not present

## 2022-01-08 DIAGNOSIS — C50912 Malignant neoplasm of unspecified site of left female breast: Secondary | ICD-10-CM | POA: Diagnosis not present

## 2022-01-14 DIAGNOSIS — Z79811 Long term (current) use of aromatase inhibitors: Secondary | ICD-10-CM | POA: Diagnosis not present

## 2022-01-14 DIAGNOSIS — M8580 Other specified disorders of bone density and structure, unspecified site: Secondary | ICD-10-CM | POA: Diagnosis not present

## 2022-01-14 DIAGNOSIS — C50912 Malignant neoplasm of unspecified site of left female breast: Secondary | ICD-10-CM | POA: Diagnosis not present

## 2022-01-14 DIAGNOSIS — Z5181 Encounter for therapeutic drug level monitoring: Secondary | ICD-10-CM | POA: Diagnosis not present

## 2022-01-14 DIAGNOSIS — Z780 Asymptomatic menopausal state: Secondary | ICD-10-CM | POA: Diagnosis not present

## 2022-02-10 ENCOUNTER — Ambulatory Visit (INDEPENDENT_AMBULATORY_CARE_PROVIDER_SITE_OTHER): Payer: Medicare HMO

## 2022-02-10 VITALS — Wt 170.0 lb

## 2022-02-10 DIAGNOSIS — Z0000 Encounter for general adult medical examination without abnormal findings: Secondary | ICD-10-CM | POA: Diagnosis not present

## 2022-02-10 NOTE — Patient Instructions (Signed)
Amanda Park , Thank you for taking time to come for your Medicare Wellness Visit. I appreciate your ongoing commitment to your health goals. Please review the following plan we discussed and let me know if I can assist you in the future.   Screening recommendations/referrals: Colonoscopy: Done 11/15/2015 - Repeat in 7 years  Mammogram: Done 10/20/2021 - Repeat every 6 months Bone Density: Done 04/30/2021 - Repeat every 2 years Recommended yearly ophthalmology/optometry visit for glaucoma screening and checkup Recommended yearly dental visit for hygiene and checkup  Vaccinations: Influenza vaccine: Done 04/30/2021 - Repeat annually Pneumococcal vaccine: Done 03/29/2015 & 04/26/2019 Tdap vaccine: Done 10/30/2021 - Repeat in 10 years Shingles vaccine: Done 06/27/2019 & 04/30/2021       Covid-19: Done 08/19/2019, 09/21/2019, & 05/23/2020  Advanced directives: Please bring a copy of your health care power of attorney and living will to the office to be added to your chart at your convenience.   Conditions/risks identified: Aim for 30 minutes of exercise or brisk walking, 6-8 glasses of water, and 5 servings of fruits and vegetables each day.   Next appointment: Follow up in one year for your annual wellness visit    Preventive Care 65 Years and Older, Female Preventive care refers to lifestyle choices and visits with your health care provider that can promote health and wellness. What does preventive care include? A yearly physical exam. This is also called an annual well check. Dental exams once or twice a year. Routine eye exams. Ask your health care provider how often you should have your eyes checked. Personal lifestyle choices, including: Daily care of your teeth and gums. Regular physical activity. Eating a healthy diet. Avoiding tobacco and drug use. Limiting alcohol use. Practicing safe sex. Taking low-dose aspirin every day. Taking vitamin and mineral supplements as recommended by your  health care provider. What happens during an annual well check? The services and screenings done by your health care provider during your annual well check will depend on your age, overall health, lifestyle risk factors, and family history of disease. Counseling  Your health care provider may ask you questions about your: Alcohol use. Tobacco use. Drug use. Emotional well-being. Home and relationship well-being. Sexual activity. Eating habits. History of falls. Memory and ability to understand (cognition). Work and work Statistician. Reproductive health. Screening  You may have the following tests or measurements: Height, weight, and BMI. Blood pressure. Lipid and cholesterol levels. These may be checked every 5 years, or more frequently if you are over 58 years old. Skin check. Lung cancer screening. You may have this screening every year starting at age 44 if you have a 30-pack-year history of smoking and currently smoke or have quit within the past 15 years. Fecal occult blood test (FOBT) of the stool. You may have this test every year starting at age 34. Flexible sigmoidoscopy or colonoscopy. You may have a sigmoidoscopy every 5 years or a colonoscopy every 10 years starting at age 20. Hepatitis C blood test. Hepatitis B blood test. Sexually transmitted disease (STD) testing. Diabetes screening. This is done by checking your blood sugar (glucose) after you have not eaten for a while (fasting). You may have this done every 1-3 years. Bone density scan. This is done to screen for osteoporosis. You may have this done starting at age 61. Mammogram. This may be done every 1-2 years. Talk to your health care provider about how often you should have regular mammograms. Talk with your health care provider about your  test results, treatment options, and if necessary, the need for more tests. Vaccines  Your health care provider may recommend certain vaccines, such as: Influenza vaccine.  This is recommended every year. Tetanus, diphtheria, and acellular pertussis (Tdap, Td) vaccine. You may need a Td booster every 10 years. Zoster vaccine. You may need this after age 1. Pneumococcal 13-valent conjugate (PCV13) vaccine. One dose is recommended after age 44. Pneumococcal polysaccharide (PPSV23) vaccine. One dose is recommended after age 42. Talk to your health care provider about which screenings and vaccines you need and how often you need them. This information is not intended to replace advice given to you by your health care provider. Make sure you discuss any questions you have with your health care provider. Document Released: 08/09/2015 Document Revised: 04/01/2016 Document Reviewed: 05/14/2015 Elsevier Interactive Patient Education  2017 [place] Prevention in the Home Falls can cause injuries. They can happen to people of all ages. There are many things you can do to make your home safe and to help prevent falls. What can I do on the outside of my home? Regularly fix the edges of walkways and driveways and fix any cracks. Remove anything that might make you trip as you walk through a door, such as a raised step or threshold. Trim any bushes or trees on the path to your home. Use bright outdoor lighting. Clear any walking paths of anything that might make someone trip, such as rocks or tools. Regularly check to see if handrails are loose or broken. Make sure that both sides of any steps have handrails. Any raised decks and porches should have guardrails on the edges. Have any leaves, snow, or ice cleared regularly. Use sand or salt on walking paths during winter. Clean up any spills in your garage right away. This includes oil or grease spills. What can I do in the bathroom? Use night lights. Install grab bars by the toilet and in the tub and shower. Do not use towel bars as grab bars. Use non-skid mats or decals in the tub or shower. If you need to sit  down in the shower, use a plastic, non-slip stool. Keep the floor dry. Clean up any water that spills on the floor as soon as it happens. Remove soap buildup in the tub or shower regularly. Attach bath mats securely with double-sided non-slip rug tape. Do not have throw rugs and other things on the floor that can make you trip. What can I do in the bedroom? Use night lights. Make sure that you have a light by your bed that is easy to reach. Do not use any sheets or blankets that are too big for your bed. They should not hang down onto the floor. Have a firm chair that has side arms. You can use this for support while you get dressed. Do not have throw rugs and other things on the floor that can make you trip. What can I do in the kitchen? Clean up any spills right away. Avoid walking on wet floors. Keep items that you use a lot in easy-to-reach places. If you need to reach something above you, use a strong step stool that has a grab bar. Keep electrical cords out of the way. Do not use floor polish or wax that makes floors slippery. If you must use wax, use non-skid floor wax. Do not have throw rugs and other things on the floor that can make you trip. What can I do with my  stairs? Do not leave any items on the stairs. Make sure that there are handrails on both sides of the stairs and use them. Fix handrails that are broken or loose. Make sure that handrails are as long as the stairways. Check any carpeting to make sure that it is firmly attached to the stairs. Fix any carpet that is loose or worn. Avoid having throw rugs at the top or bottom of the stairs. If you do have throw rugs, attach them to the floor with carpet tape. Make sure that you have a light switch at the top of the stairs and the bottom of the stairs. If you do not have them, ask someone to add them for you. What else can I do to help prevent falls? Wear shoes that: Do not have high heels. Have rubber bottoms. Are  comfortable and fit you well. Are closed at the toe. Do not wear sandals. If you use a stepladder: Make sure that it is fully opened. Do not climb a closed stepladder. Make sure that both sides of the stepladder are locked into place. Ask someone to hold it for you, if possible. Clearly mark and make sure that you can see: Any grab bars or handrails. First and last steps. Where the edge of each step is. Use tools that help you move around (mobility aids) if they are needed. These include: Canes. Walkers. Scooters. Crutches. Turn on the lights when you go into a dark area. Replace any light bulbs as soon as they burn out. Set up your furniture so you have a clear path. Avoid moving your furniture around. If any of your floors are uneven, fix them. If there are any pets around you, be aware of where they are. Review your medicines with your doctor. Some medicines can make you feel dizzy. This can increase your chance of falling. Ask your doctor what other things that you can do to help prevent falls. This information is not intended to replace advice given to you by your health care provider. Make sure you discuss any questions you have with your health care provider. Document Released: 05/09/2009 Document Revised: 12/19/2015 Document Reviewed: 08/17/2014 Elsevier Interactive Patient Education  2017 [employer].

## 2022-02-10 NOTE — Progress Notes (Signed)
Subjective:   Amanda Park is a 76 y.o. female who presents for Medicare Annual (Subsequent) preventive examination.  Virtual Visit via Telephone Note  I connected with  Amanda Park on 02/10/22 at  9:45 AM EDT by telephone and verified that I am speaking with the correct person using two identifiers.  Location: Patient: Home Provider: [facility] Persons participating in the virtual visit: patient/Nurse Health Advisor   I discussed the limitations, risks, security and privacy concerns of performing an evaluation and management service by telephone and the availability of in person appointments. The patient expressed understanding and agreed to proceed.  Interactive audio and video telecommunications were attempted between this nurse and patient, however failed, due to patient having technical difficulties OR patient did not have access to video capability.  We continued and completed visit with audio only.  Some vital signs may be absent or patient reported.   Amanda Park Amanda Amanda Ruggerio, LPN   Review of Systems     Cardiac Risk Factors include: advanced age (>67mn, >>59women);dyslipidemia;hypertension     Objective:    Today's Vitals   02/10/22 0949  Weight: 170 lb (77.1 kg)   Body mass index is 28.29 kg/m.     02/10/2022    9:53 AM 11/26/2020    2:10 PM 10/26/2019    1:46 PM 11/15/2015   10:37 AM 10/31/2015   10:06 AM 09/11/2014    4:56 PM  Advanced Directives  Does Patient Have a Medical Advance Directive? No No No No No No  Would patient like information on creating a medical advance directive? No - Patient declined Yes (MAU/Ambulatory/Procedural Areas - Information given) Yes (MAU/Ambulatory/Procedural Areas - Information given) No - patient declined information No - patient declined information No - patient declined information    Current Medications (verified) Outpatient Encounter Medications as of 02/10/2022  Medication Sig   anastrozole (ARIMIDEX) 1 MG tablet Take 1 mg by mouth  daily.   aspirin EC 81 MG tablet Take 81 mg by mouth daily.   cetirizine (ZYRTEC) 10 MG tablet Take 1 tablet (10 mg total) by mouth daily.   escitalopram (LEXAPRO) 10 MG tablet Take 1 tablet (10 mg total) by mouth daily.   ibuprofen (ADVIL) 200 MG tablet Take 400 mg by mouth every other day.   levocetirizine (XYZAL) 5 MG tablet Take 5 mg by mouth every evening.   melatonin 5 MG TABS Take 5 mg by mouth.   metoprolol succinate (TOPROL-XL) 25 MG 24 hr tablet Take 1 tablet (25 mg total) by mouth daily.   omeprazole (PRILOSEC) 40 MG capsule Take 1 capsule (40 mg total) by mouth daily.   pramipexole (MIRAPEX) 0.25 MG tablet TAKE 1 TABLET AT BEDTIME   simvastatin (ZOCOR) 40 MG tablet TAKE 1 TABLET EVERY DAY   traMADol (ULTRAM) 50 MG tablet Take 1 tablet (50 mg total) by mouth every 6 (six) hours as needed.   [DISCONTINUED] azithromycin (ZITHROMAX Z-PAK) 250 MG tablet As directed   No facility-administered encounter medications on file as of 02/10/2022.    Allergies (verified) Patient has no known allergies.   History: Past Medical History:  Diagnosis Date   Allergy    Hyperlipidemia    Hypertension    Osteopenia    PVC (premature ventricular contraction)    Past Surgical History:  Procedure Laterality Date   ABDOMINAL HYSTERECTOMY     BREAST BIOPSY Bilateral 10/24/2020   Fibroadenoma bilaterally   cyst removed from left arm pit  03/15/2017   FOOT SURGERY  TMJ ARTHROPLASTY     Family History  Problem Relation Age of Onset   Cancer Mother 28       cancer   Hypertension Mother    Kidney disease Mother    Dementia Mother    Heart disease Mother    Breast cancer Mother    Heart disease Paternal Grandfather    Prostate cancer Father    Prostate cancer Brother    Stomach cancer Paternal Uncle    Kidney disease Sister    Parkinson's disease Sister    Diabetes Sister    Heart disease Sister    Dementia Sister    Colon cancer Neg Hx    Social History   Socioeconomic  History   Marital status: Married    Spouse name: Amanda Park   Number of children: 2   Years of education: 12   Highest education level: 12th grade  Occupational History   Occupation: Retired  Tobacco Use   Smoking status: Never   Smokeless tobacco: Never  Vaping Use   Vaping Use: Never used  Substance and Sexual Activity   Alcohol use: No    Alcohol/week: 0.0 standard drinks of alcohol   Drug use: No   Sexual activity: Not Currently  Other Topics Concern   Not on file  Social History Narrative   Married, lives with husband, very active in church, outdoors, gardening   Her washer and dryer and sewing machine are in the basement - she uses the stairs several times per day   Social Determinants of Health   Financial Resource Strain: Low Risk  (02/10/2022)   Overall Financial Resource Strain (CARDIA)    Difficulty of Paying Living Expenses: Not hard at all  Food Insecurity: No Food Insecurity (02/10/2022)   Hunger Vital Sign    Worried About Running Out of Food in the Last Year: Never true    [place] in the Last Year: Never true  Transportation Needs: No Transportation Needs (02/10/2022)   PRAPARE - Hydrologist (Medical): No    Lack of Transportation (Non-Medical): No  Physical Activity: Insufficiently Active (02/10/2022)   Exercise Vital Sign    Days of Exercise per Week: 7 days    Minutes of Exercise per Session: 20 min  Stress: No Stress Concern Present (02/10/2022)   [place]    Feeling of Stress : Not at all  Social Connections: [place] (02/10/2022)   Social Connection and Isolation Panel [NHANES]    Frequency of Communication with Friends and Family: More than three times a week    Frequency of Social Gatherings with Friends and Family: More than three times a week    Attends Religious Services: More than 4 times per year    Active Member of [employer] or  Organizations: Yes    Attends Music therapist: More than 4 times per year    Marital Status: Married    Tobacco Counseling Counseling given: Not Answered   Clinical Intake:  Pre-visit preparation completed: Yes  Pain : No/denies pain     BMI - recorded: 28.29 Nutritional Status: BMI 25 -29 Overweight Nutritional Risks: None Diabetes: No  How often do you need to have someone help you when you read instructions, pamphlets, or other written materials from your doctor or pharmacy?: 1 - Never  Diabetic? no  Interpreter Needed?: No  Information entered by :: Adalberto Cole, LPN   Activities  of Daily Living    02/10/2022    9:54 AM  In your present state of health, do you have any difficulty performing the following activities:  Hearing? 0  Vision? 0  Difficulty concentrating or making decisions? 0  Walking or climbing stairs? 0  Dressing or bathing? 0  Doing errands, shopping? 0  Preparing Food and eating ? N  Using the Toilet? N  In the past six months, have you accidently leaked urine? N  Do you have problems with loss of bowel control? N  Managing your Medications? N  Managing your Finances? N  Housekeeping or managing your Housekeeping? N    Patient Care Team: [name], FNP as PCP - General (Nurse Practitioner)  Indicate any recent Medical Services you may have received from other than [facility] providers in the past year (date may be approximate).     Assessment:   This is a routine wellness examination for Khiya.  Hearing/Vision screen Hearing Screening - Comments:: Denies hearing difficulties   Vision Screening - Comments:: Wears rx glasses - up to date with routine [facility] exams with [facility]  Dietary issues and exercise activities discussed: Current Exercise Habits: Home exercise routine, Type of exercise: walking, Time (Minutes): 20, Frequency (Times/Week): 7, Weekly Exercise (Minutes/Week): 140, Intensity: Mild, Exercise  limited by: orthopedic condition(s)   Goals Addressed             This Visit's Progress    Patient Stated       Wants to get her strength and energy back so she can do more       Depression Screen    02/10/2022    9:52 AM 10/30/2021    9:47 AM 07/24/2021    8:18 AM 04/30/2021    9:38 AM 11/26/2020    2:08 PM 11/15/2020    3:32 PM 10/28/2020    9:59 AM  PHQ 2/9 Scores  PHQ - 2 Score 0 0 2 3 0 0 0  PHQ- 9 Score  '3 5 12       '$ Fall Risk    02/10/2022    9:50 AM 10/30/2021    9:47 AM 07/24/2021    8:17 AM 04/30/2021    9:38 AM 11/26/2020    2:06 PM  [place] in the past year? 0 0 0 0 1  Number falls in past yr: 0    1  Comment     she fell once because of painful cyst on back of knee - after I&D later that week, she was resting in a wheelchair and it flipped backward and she hit head - no injury - no since problems  Injury with Fall? 0    0  Risk for fall due to : Orthopedic patient;Impaired balance/gait    History of fall(s);Impaired vision  Follow up Falls prevention discussed    Education provided;Falls prevention discussed    FALL RISK PREVENTION PERTAINING TO THE HOME:  Any stairs in or around the home? Yes  If so, are there any without handrails? No  Home free of loose throw rugs in walkways, pet beds, electrical cords, etc? Yes  Adequate lighting in your home to reduce risk of falls? Yes   ASSISTIVE DEVICES UTILIZED TO PREVENT FALLS:  Life alert? No  Use of a cane, walker or w/c?  Cane prn Grab bars in the bathroom? Yes  Shower chair or bench in shower? Yes  Elevated toilet seat or a handicapped toilet? Yes  TIMED UP AND GO:  Was the test performed? No . Telephonic visit  Cognitive Function:        02/10/2022    9:54 AM  6CIT Screen  What Year? 0 points  What month? 0 points  What time? 0 points  Count back from 20 0 points  Months in reverse 0 points  Repeat phrase 0 points  Total Score 0 points    Immunizations Immunization History   Administered Date(s) Administered   Fluad Quad(high Dose 65+) 04/26/2019, 04/29/2020, 04/30/2021   Influenza Split 04/20/2013   Influenza, High Dose Seasonal PF 04/30/2016, 04/21/2017, 04/22/2018   Influenza,inj,Quad PF,6+ Mos 04/17/2014, 05/07/2015   Moderna SARS-COV2 Booster Vaccination 05/23/2020   Moderna Sars-Covid-2 Vaccination 08/19/2019, 09/21/2019   Pneumococcal Conjugate-13 03/29/2015   Pneumococcal Polysaccharide-23 06/27/2011, 04/26/2019   Tdap 10/30/2021   Zoster Recombinat (Shingrix) 06/27/2019, 04/30/2021   Zoster, Live 04/24/2011    TDAP status: Up to date  Flu Vaccine status: Up to date  Pneumococcal vaccine status: Up to date  Covid-19 vaccine status: Completed vaccines  Qualifies for Shingles Vaccine? Yes   Zostavax completed Yes   Shingrix Completed?: Yes  Screening Tests Health Maintenance  Topic Date Due   COVID-19 Vaccine (3 - Moderna risk series) 06/20/2020   INFLUENZA VACCINE  02/24/2022   MAMMOGRAM  04/22/2022   COLONOSCOPY (Pts 45-11yr Insurance coverage will need to be confirmed)  11/15/2022   DEXA SCAN  05/01/2023   TETANUS/TDAP  10/31/2031   Pneumonia Vaccine 77 Years old  Completed   Hepatitis C Screening  Completed   Zoster Vaccines- Shingrix  Completed   HPV VACCINES  Aged Out    Health Maintenance  Health Maintenance Due  Topic Date Due   COVID-19 Vaccine (3 - Moderna risk series) 06/20/2020    Colorectal cancer screening: Type of screening: Colonoscopy. Completed 11/15/2015. Repeat every 7 years  Mammogram status: Completed 10/20/2021; Repeat every 6 months until notified otherwise  Bone Density status: Completed 04/30/2021. Results reflect: Bone density results: OSTEOPENIA. Repeat every 2 years.  Lung Cancer Screening: (Low Dose CT Chest recommended if Age 77-80years, 30 pack-year currently smoking OR have quit w/in 15years.) does not qualify.   Additional Screening:  Hepatitis C Screening: does qualify; Completed  03/27/2016  Vision Screening: Recommended annual ophthalmology exams for early detection of glaucoma and other disorders of the eye. Is the patient up to date with their annual eye exam?  Yes  Who is the provider or what is the name of the office in which the patient attends annual eye exams[place] not established with a provider, would they like to be referred to a provider to establish care? No .   Dental Screening: Recommended annual dental exams for proper oral hygiene  Community Resource Referral / Chronic Care Management: CRR required this visit?  No   CCM required this visit?  No      Plan:     I have personally reviewed and noted the following in the patient's chart:   Medical and social history Use of alcohol, tobacco or illicit drugs  Current medications and supplements including opioid prescriptions.  Functional ability and status Nutritional status Physical activity Advanced directives List of other physicians Hospitalizations, surgeries, and ER visits in previous 12 months Vitals Screenings to include cognitive, depression, and falls Referrals and appointments  In addition, I have reviewed and discussed with patient certain preventive protocols, quality metrics, and best practice recommendations. A written personalized care plan for  preventive services as well as general preventive health recommendations were provided to patient.     Sandrea Hammond, LPN   9/31/1216   Nurse Notes: None

## 2022-02-19 DIAGNOSIS — H524 Presbyopia: Secondary | ICD-10-CM | POA: Diagnosis not present

## 2022-02-19 DIAGNOSIS — E7800 Pure hypercholesterolemia, unspecified: Secondary | ICD-10-CM | POA: Diagnosis not present

## 2022-02-19 DIAGNOSIS — H5210 Myopia, unspecified eye: Secondary | ICD-10-CM | POA: Diagnosis not present

## 2022-03-18 DIAGNOSIS — C50912 Malignant neoplasm of unspecified site of left female breast: Secondary | ICD-10-CM | POA: Diagnosis not present

## 2022-04-27 ENCOUNTER — Ambulatory Visit (INDEPENDENT_AMBULATORY_CARE_PROVIDER_SITE_OTHER): Payer: Medicare HMO | Admitting: Nurse Practitioner

## 2022-04-27 ENCOUNTER — Encounter: Payer: Self-pay | Admitting: Nurse Practitioner

## 2022-04-27 VITALS — BP 131/66 | HR 73 | Temp 97.0°F | Resp 20 | Ht 65.0 in | Wt 169.0 lb

## 2022-04-27 DIAGNOSIS — K219 Gastro-esophageal reflux disease without esophagitis: Secondary | ICD-10-CM

## 2022-04-27 DIAGNOSIS — Z23 Encounter for immunization: Secondary | ICD-10-CM | POA: Diagnosis not present

## 2022-04-27 DIAGNOSIS — M8588 Other specified disorders of bone density and structure, other site: Secondary | ICD-10-CM

## 2022-04-27 DIAGNOSIS — E785 Hyperlipidemia, unspecified: Secondary | ICD-10-CM

## 2022-04-27 DIAGNOSIS — F411 Generalized anxiety disorder: Secondary | ICD-10-CM

## 2022-04-27 DIAGNOSIS — Z6829 Body mass index (BMI) 29.0-29.9, adult: Secondary | ICD-10-CM

## 2022-04-27 DIAGNOSIS — G2581 Restless legs syndrome: Secondary | ICD-10-CM

## 2022-04-27 DIAGNOSIS — I10 Essential (primary) hypertension: Secondary | ICD-10-CM

## 2022-04-27 DIAGNOSIS — C50912 Malignant neoplasm of unspecified site of left female breast: Secondary | ICD-10-CM

## 2022-04-27 MED ORDER — OMEPRAZOLE 40 MG PO CPDR: 40.0000 mg | DELAYED_RELEASE_CAPSULE | Freq: Every day | ORAL | 1 refills | Status: DC

## 2022-04-27 MED ORDER — PRAMIPEXOLE DIHYDROCHLORIDE 0.25 MG PO TABS: ORAL_TABLET | ORAL | 1 refills | Status: DC

## 2022-04-27 MED ORDER — SIMVASTATIN 40 MG PO TABS: ORAL_TABLET | ORAL | 1 refills | Status: DC

## 2022-04-27 MED ORDER — ESCITALOPRAM OXALATE 10 MG PO TABS: 10.0000 mg | ORAL_TABLET | Freq: Every day | ORAL | 1 refills | Status: DC

## 2022-04-27 MED ORDER — METOPROLOL SUCCINATE ER 25 MG PO TB24: 25.0000 mg | ORAL_TABLET | Freq: Every day | ORAL | 1 refills | Status: DC

## 2022-04-27 NOTE — Patient Instructions (Signed)
Bone Health Bones protect organs, store calcium, anchor muscles, and support the whole body. Keeping your bones strong is important, especially as you get older. You can take actions to help keep your bones strong and healthy. Why is keeping my bones healthy important?  Keeping your bones healthy is important because your body constantly replaces bone cells. Cells get old, and new cells take their place. As we age, we lose bone cells because the body may not be able to make enough new cells to replace the old cells. The amount of bone cells and bone tissue you have is referred to as bone mass. The higher your bone mass, the stronger your bones. The aging process leads to an overall loss of bone mass in the body, which can increase the likelihood of: Broken bones. A condition in which the bones become weak and brittle (osteoporosis). A large decline in bone mass occurs in older adults. In women, it occurs about the time of menopause. What actions can I take to keep my bones healthy? Good health habits are important for maintaining healthy bones. This includes eating nutritious foods and exercising regularly. To have healthy bones, you need to get enough of the right minerals and vitamins. Most nutrition experts recommend getting these nutrients from the foods that you eat. In some cases, taking supplements may also be recommended. Doing certain types of exercise is also important for bone health. What are the nutritional recommendations for healthy bones?  Eating a well-balanced diet with plenty of calcium and vitamin D will help to protect your bones. Nutritional recommendations vary from person to person. Ask your health care provider what is healthy for you. Here are some general guidelines. Get enough calcium Calcium is the most important (essential) mineral for bone health. Most people can get enough calcium from their diet, but supplements may be recommended for people who are at risk for  osteoporosis. Good sources of calcium include: Dairy products, such as low-fat or nonfat milk, cheese, and yogurt. Dark green leafy vegetables, such as bok choy and broccoli. Foods that have calcium added to them (are fortified). Foods that may be fortified with calcium include orange juice, cereal, bread, soy beverages, and tofu products. Nuts, such as almonds. Follow these recommended amounts for daily calcium intake: Infants, 0-6 months: 200 mg. Infants, 6-12 months: 260 mg. Children, age 1-3: 700 mg. Children, age 4-8: 1,000 mg. Children, age 9-13: 1,300 mg. Teens, age 14-18: 1,300 mg. Adults, age 19-50: 1,000 mg. Adults, age 51-70: Men: 1,000 mg. Women: 1,200 mg. Adults, age 71 or older: 1,200 mg. Pregnant and breastfeeding females: Teens: 1,300 mg. Adults: 1,000 mg. Get enough vitamin D Vitamin D is the most essential vitamin for bone health. It helps the body absorb calcium. Sunlight stimulates the skin to make vitamin D, so be sure to get enough sunlight. If you live in a cold climate or you do not get outside often, your health care provider may recommend that you take vitamin D supplements. Good sources of vitamin D in your diet include: Egg yolks. Saltwater fish. Milk and cereal fortified with vitamin D. Follow these recommended amounts for daily vitamin D intake: Infants, 0-12 months: 400 international units (IU). Children and teens, age 1-18: 600 international units. Adults, age 59 or younger: 600 international units. Adults, age 60 or older: 600-1,000 international units. Get other important nutrients Other nutrients that are important for bone health include: Phosphorus. This mineral is found in meat, poultry, dairy foods, nuts, and legumes. The   recommended daily intake for adult men and adult women is 700 mg. Magnesium. This mineral is found in seeds, nuts, dark green vegetables, and legumes. The recommended daily intake for adult men is 400-420 mg. For adult women,  it is 310-320 mg. Vitamin K. This vitamin is found in green leafy vegetables. The recommended daily intake is 120 mcg for adult men and 90 mcg for adult women. What type of physical activity is best for building and maintaining healthy bones? Weight-bearing and strength-building activities are important for building and maintaining healthy bones. Weight-bearing activities cause muscles and bones to work against gravity. Strength-building activities increase the strength of the muscles that support bones. Weight-bearing and muscle-building activities include: Walking and hiking. Jogging and running. Dancing. Gym exercises. Lifting weights. Tennis and racquetball. Climbing stairs. Aerobics. Adults should get at least 30 minutes of moderate physical activity on most days. Children should get at least 60 minutes of moderate physical activity on most days. Ask your health care provider what type of exercise is best for you. How can I find out if my bone mass is low? Bone mass can be measured with an X-ray test called a bone mineral density (BMD) test. This test is recommended for all women who are age 65 or older. It may also be recommended for: Men who are age 70 or older. People who are at risk for osteoporosis because of: Having a long-term disease that weakens bones, such as kidney disease or rheumatoid arthritis. Having menopause earlier than normal. Taking medicine that weakens bones, such as steroids, thyroid hormones, or hormone treatment for breast cancer or prostate cancer. Smoking. Drinking three or more alcoholic drinks a day. Being underweight. Sedentary lifestyle. If you find that you have a low bone mass, you may be able to prevent osteoporosis or further bone loss by changing your diet and lifestyle. Where can I find more information? Bone Health & Osteoporosis Foundation: www.nof.org/patients National Institutes of Health: www.bones.nih.gov International Osteoporosis  Foundation: www.iofbonehealth.org Summary The aging process leads to an overall loss of bone mass in the body, which can increase the likelihood of broken bones and osteoporosis. Eating a well-balanced diet with plenty of calcium and vitamin D will help to protect your bones. Weight-bearing and strength-building activities are also important for building and maintaining strong bones. Bone mass can be measured with an X-ray test called a bone mineral density (BMD) test. This information is not intended to replace advice given to you by your health care provider. Make sure you discuss any questions you have with your health care provider. Document Revised: 12/25/2020 Document Reviewed: 12/25/2020 Elsevier Patient Education  2023 Elsevier Inc.  

## 2022-04-27 NOTE — Progress Notes (Signed)
Subjective:    Patient ID: Amanda Park, female    DOB: [birth year], 76 y.o.   MRN: [id number]   Chief Complaint: medical management of chronic issues     HPI:  Amanda Park is a 76 y.o. who identifies as a female who was assigned female at birth.   Social history: Lives with: husband Work history: retired   Scientist, forensic in today for follow up of the following chronic medical issues:  1. Essential hypertension, benign No c/o chest pain, sob or headache. Does not check blood pressure at home. BP Readings from Last 3 Encounters:  10/30/21 136/78  07/24/21 126/63  04/30/21 132/80     2. Hyperlipidemia with target LDL less than 100 Does try to watch diet. Does no dedicated exercise. Lab Results  Component Value Date   CHOL 157 10/30/2021   HDL 46 10/30/2021   LDLCALC 91 10/30/2021   TRIG 111 10/30/2021   CHOLHDL 3.4 10/30/2021   The 10-year ASCVD risk score (Arnett DK, et al., 2019) is: 26.2%   3. Gastroesophageal reflux disease without esophagitis Is on omperazole and is dping well.  4. RLS (restless legs syndrome) Is on mirapex which is working well for her.  5. Infiltrating ductal carcinoma of left breast [facility]) She is on a chemo pill right  now. She has repeat mammogram next month. She had a lumpectomy.   6. Osteopenia of lumbar spine Last dexascan was done on 05/01/21. Her t score was -2.0  7. GAD I son lexapro an dis doing well.    04/27/2022   10:05 AM 02/10/2022    9:52 AM 10/30/2021    9:47 AM  Depression screen PHQ 2/9  Decreased Interest 1 0 0  Down, Depressed, Hopeless 0 0 0  PHQ - 2 Score 1 0 0  Altered sleeping 0  0  Tired, decreased energy 0  0  Change in appetite 0  3  Feeling bad or failure about yourself  0  0  Trouble concentrating 0  0  Moving slowly or fidgety/restless 0  0  Suicidal thoughts 0  0  PHQ-9 Score 1  3  Difficult doing work/chores Not difficult at all  Not difficult at all      04/27/2022   10:06 AM 10/30/2021    9:47 AM  07/24/2021    8:18 AM 04/30/2021    9:38 AM  GAD 7 : Generalized Anxiety Score  Nervous, Anxious, on Edge 0 0 0 0  Control/stop worrying 0 0 0 3  Worry too much - different things 0 0 0 3  Trouble relaxing 0 0 0 0  Restless 0 0 3 0  Easily annoyed or irritable 0 0 0 3  Afraid - awful might happen 0 0 0 0  Total GAD 7 Score 0 0 3 9  Anxiety Difficulty Not difficult at all Not difficult at all Not difficult at all Not difficult at all      8. BMI 29.0-29.9,adult No recent weight changes Wt Readings from Last 3 Encounters:  04/27/22 169 lb (76.7 kg)  02/10/22 170 lb (77.1 kg)  10/30/21 174 lb (78.9 kg)   BMI Readings from Last 3 Encounters:  04/27/22 28.12 kg/m  02/10/22 28.29 kg/m  10/30/21 28.96 kg/m     New complaints: None today  No Known Allergies Outpatient Encounter Medications as of 04/27/2022  Medication Sig   anastrozole (ARIMIDEX) 1 MG tablet Take 1 mg by mouth daily.   aspirin EC 81 MG tablet Take  81 mg by mouth daily.   cetirizine (ZYRTEC) 10 MG tablet Take 1 tablet (10 mg total) by mouth daily.   escitalopram (LEXAPRO) 10 MG tablet Take 1 tablet (10 mg total) by mouth daily.   ibuprofen (ADVIL) 200 MG tablet Take 400 mg by mouth every other day.   levocetirizine (XYZAL) 5 MG tablet Take 5 mg by mouth every evening.   melatonin 5 MG TABS Take 5 mg by mouth.   metoprolol succinate (TOPROL-XL) 25 MG 24 hr tablet Take 1 tablet (25 mg total) by mouth daily.   omeprazole (PRILOSEC) 40 MG capsule Take 1 capsule (40 mg total) by mouth daily.   pramipexole (MIRAPEX) 0.25 MG tablet TAKE 1 TABLET AT BEDTIME   simvastatin (ZOCOR) 40 MG tablet TAKE 1 TABLET EVERY DAY   traMADol (ULTRAM) 50 MG tablet Take 1 tablet (50 mg total) by mouth every 6 (six) hours as needed.   No facility-administered encounter medications on file as of 04/27/2022.    Past Surgical History:  Procedure Laterality Date   ABDOMINAL HYSTERECTOMY     BREAST BIOPSY Bilateral 10/24/2020    Fibroadenoma bilaterally   cyst removed from left arm pit  03/15/2017   FOOT SURGERY     TMJ ARTHROPLASTY      Family History  Problem Relation Age of Onset   Cancer Mother 3       cancer   Hypertension Mother    Kidney disease Mother    Dementia Mother    Heart disease Mother    Breast cancer Mother    Heart disease Paternal Grandfather    Prostate cancer Father    Prostate cancer Brother    Stomach cancer Paternal Uncle    Kidney disease Sister    Parkinson's disease Sister    Diabetes Sister    Heart disease Sister    Dementia Sister    Colon cancer Neg Hx       Controlled substance contract: n/a     Review of Systems  Constitutional:  Negative for diaphoresis.  Eyes:  Negative for pain.  Respiratory:  Negative for shortness of breath.   Cardiovascular:  Negative for chest pain, palpitations and leg swelling.  Gastrointestinal:  Negative for abdominal pain.  Endocrine: Negative for polydipsia.  Skin:  Negative for rash.  Neurological:  Negative for dizziness, weakness and headaches.  Hematological:  Does not bruise/bleed easily.  All other systems reviewed and are negative.      Objective:   Physical Exam Vitals and nursing note reviewed.  Constitutional:      General: She is not in acute distress.    Appearance: Normal appearance. She is well-developed.  HENT:     Head: Normocephalic.     Right Ear: Tympanic membrane normal.     Left Ear: Tympanic membrane normal.     Nose: Nose normal.     Mouth/Throat:     Mouth: Mucous membranes are moist.  Eyes:     Pupils: Pupils are equal, round, and reactive to light.  Neck:     Vascular: No carotid bruit or JVD.  Cardiovascular:     Rate and Rhythm: Normal rate and regular rhythm.     Heart sounds: Normal heart sounds.  Pulmonary:     Effort: Pulmonary effort is normal. No respiratory distress.     Breath sounds: Normal breath sounds. No wheezing or rales.  Chest:     Chest wall: No tenderness.   Abdominal:     General: Bowel  sounds are normal. There is no distension or abdominal bruit.     Palpations: Abdomen is soft. There is no hepatomegaly, splenomegaly, mass or pulsatile mass.     Tenderness: There is no abdominal tenderness.  Musculoskeletal:        General: Normal range of motion.     Cervical back: Normal range of motion and neck supple.  Lymphadenopathy:     Cervical: No cervical adenopathy.  Skin:    General: Skin is warm and dry.  Neurological:     Mental Status: She is alert and oriented to person, place, and time.     Deep Tendon Reflexes: Reflexes are normal and symmetric.  Psychiatric:        Behavior: Behavior normal.        Thought Content: Thought content normal.        Judgment: Judgment normal.     BP 131/66   Pulse 73   Temp (!) 97 F (36.1 C) (Temporal)   Resp 20   Ht _0  (1.651 m)   Wt 169 lb (76.7 kg)   SpO2 96%   BMI 28.12 kg/m        Assessment & Plan:   Amanda Park comes in today with chief complaint of Medical Management of Chronic Issues   Diagnosis and orders addressed:  1. Essential hypertension, benign Low sodium diet - metoprolol succinate (TOPROL-XL) 25 MG 24 hr tablet; Take 1 tablet (25 mg total) by mouth daily.  Dispense: 90 tablet; Refill: 1 - CBC with Differential/Platelet - CMP14+EGFR  2. Hyperlipidemia with target LDL less than 100 Low fat diet - simvastatin (ZOCOR) 40 MG tablet; TAKE 1 TABLET EVERY DAY  Dispense: 90 tablet; Refill: 1 - Lipid panel  3. Gastroesophageal reflux disease without esophagitis Avoid spicy foods Do not eat 2 hours prior to bedtime - omeprazole (PRILOSEC) 40 MG capsule; Take 1 capsule (40 mg total) by mouth daily.  Dispense: 90 capsule; Refill: 1  4. RLS (restless legs syndrome) Keep legs warm at night - pramipexole (MIRAPEX) 0.25 MG tablet; TAKE 1 TABLET AT BEDTIME  Dispense: 90 tablet; Refill: 1  5. Infiltrating ductal carcinoma of left breast ([place]) Keep follo wuup with  oncology  6. Osteopenia of lumbar spine Weight bearing exercises encouraged  7. BMI 29.0-29.9,adult Discussed diet and exercise for person with BMI >25 Will recheck weight in 3-6 months   8. GAD (generalized anxiety disorder) Stress management - escitalopram (LEXAPRO) 10 MG tablet; Take 1 tablet (10 mg total) by mouth daily.  Dispense: 90 tablet; Refill: 1   Labs pending Health Maintenance reviewed Diet and exercise encouraged  Follow up plan: 6 months   Mary-Margaret Hassell Done, FNP

## 2022-04-28 LAB — CBC WITH DIFFERENTIAL/PLATELET
Basophils Absolute: 0 10*3/uL (ref 0.0–0.2)
Basos: 1 %
EOS (ABSOLUTE): 0.1 10*3/uL (ref 0.0–0.4)
Eos: 1 %
Hematocrit: 41.6 % (ref 34.0–46.6)
Hemoglobin: 14 g/dL (ref 11.1–15.9)
Immature Grans (Abs): 0 10*3/uL (ref 0.0–0.1)
Immature Granulocytes: 0 %
Lymphocytes Absolute: 1.7 10*3/uL (ref 0.7–3.1)
Lymphs: 31 %
MCH: 29.1 pg (ref 26.6–33.0)
MCHC: 33.7 g/dL (ref 31.5–35.7)
MCV: 87 fL (ref 79–97)
Monocytes Absolute: 0.3 10*3/uL (ref 0.1–0.9)
Monocytes: 5 %
Neutrophils Absolute: 3.4 10*3/uL (ref 1.4–7.0)
Neutrophils: 62 %
Platelets: 196 10*3/uL (ref 150–450)
RBC: 4.81 x10E6/uL (ref 3.77–5.28)
RDW: 13.1 % (ref 11.7–15.4)
WBC: 5.5 10*3/uL (ref 3.4–10.8)

## 2022-04-28 LAB — CMP14+EGFR
ALT: 17 IU/L (ref 0–32)
AST: 14 IU/L (ref 0–40)
Albumin/Globulin Ratio: 2.4 — ABNORMAL HIGH (ref 1.2–2.2)
Albumin: 4.6 g/dL (ref 3.8–4.8)
Alkaline Phosphatase: 60 IU/L (ref 44–121)
BUN/Creatinine Ratio: 17 (ref 12–28)
BUN: 20 mg/dL (ref 8–27)
Bilirubin Total: 0.5 mg/dL (ref 0.0–1.2)
CO2: 18 mmol/L — ABNORMAL LOW (ref 20–29)
Calcium: 9.7 mg/dL (ref 8.7–10.3)
Chloride: 104 mmol/L (ref 96–106)
Creatinine, Ser: 1.19 mg/dL — ABNORMAL HIGH (ref 0.57–1.00)
Globulin, Total: 1.9 g/dL (ref 1.5–4.5)
Glucose: 96 mg/dL (ref 70–99)
Potassium: 4.4 mmol/L (ref 3.5–5.2)
Sodium: 140 mmol/L (ref 134–144)
Total Protein: 6.5 g/dL (ref 6.0–8.5)
eGFR: 47 mL/min/{1.73_m2} — ABNORMAL LOW (ref >59)

## 2022-04-28 LAB — LIPID PANEL
Chol/HDL Ratio: 3.8 ratio (ref 0.0–4.4)
Cholesterol, Total: 178 mg/dL (ref 100–199)
HDL: 47 mg/dL (ref >39)
LDL Chol Calc (NIH): 105 mg/dL — ABNORMAL HIGH (ref 0–99)
Triglycerides: 150 mg/dL — ABNORMAL HIGH (ref 0–149)
VLDL Cholesterol Cal: 26 mg/dL (ref 5–40)

## 2022-06-17 ENCOUNTER — Encounter: Payer: Self-pay | Admitting: Nurse Practitioner

## 2022-06-25 DIAGNOSIS — C50912 Malignant neoplasm of unspecified site of left female breast: Secondary | ICD-10-CM | POA: Diagnosis not present

## 2022-06-25 DIAGNOSIS — R92333 Mammographic heterogeneous density, bilateral breasts: Secondary | ICD-10-CM | POA: Diagnosis not present

## 2022-06-25 DIAGNOSIS — C50612 Malignant neoplasm of axillary tail of left female breast: Secondary | ICD-10-CM | POA: Diagnosis not present

## 2022-06-25 DIAGNOSIS — Z9012 Acquired absence of left breast and nipple: Secondary | ICD-10-CM | POA: Diagnosis not present

## 2022-06-26 DIAGNOSIS — Z79811 Long term (current) use of aromatase inhibitors: Secondary | ICD-10-CM | POA: Diagnosis not present

## 2022-06-26 DIAGNOSIS — C50912 Malignant neoplasm of unspecified site of left female breast: Secondary | ICD-10-CM | POA: Diagnosis not present

## 2022-06-26 DIAGNOSIS — Z5181 Encounter for therapeutic drug level monitoring: Secondary | ICD-10-CM | POA: Diagnosis not present

## 2022-06-26 DIAGNOSIS — M8580 Other specified disorders of bone density and structure, unspecified site: Secondary | ICD-10-CM | POA: Diagnosis not present

## 2022-06-26 DIAGNOSIS — Z780 Asymptomatic menopausal state: Secondary | ICD-10-CM | POA: Diagnosis not present

## 2022-10-02 ENCOUNTER — Other Ambulatory Visit: Payer: Self-pay | Admitting: Nurse Practitioner

## 2022-10-02 DIAGNOSIS — I10 Essential (primary) hypertension: Secondary | ICD-10-CM

## 2022-10-02 DIAGNOSIS — F411 Generalized anxiety disorder: Secondary | ICD-10-CM

## 2022-10-02 DIAGNOSIS — G2581 Restless legs syndrome: Secondary | ICD-10-CM

## 2022-10-10 ENCOUNTER — Other Ambulatory Visit: Payer: Self-pay | Admitting: Nurse Practitioner

## 2022-10-10 DIAGNOSIS — E785 Hyperlipidemia, unspecified: Secondary | ICD-10-CM

## 2022-10-10 DIAGNOSIS — K219 Gastro-esophageal reflux disease without esophagitis: Secondary | ICD-10-CM

## 2022-10-27 ENCOUNTER — Encounter: Payer: Self-pay | Admitting: Nurse Practitioner

## 2022-10-27 ENCOUNTER — Ambulatory Visit (INDEPENDENT_AMBULATORY_CARE_PROVIDER_SITE_OTHER): Payer: Medicare HMO | Admitting: Nurse Practitioner

## 2022-10-27 VITALS — BP 135/68 | HR 50 | Temp 97.1°F | Resp 20 | Ht 65.0 in | Wt 176.0 lb

## 2022-10-27 DIAGNOSIS — F411 Generalized anxiety disorder: Secondary | ICD-10-CM

## 2022-10-27 DIAGNOSIS — K219 Gastro-esophageal reflux disease without esophagitis: Secondary | ICD-10-CM | POA: Diagnosis not present

## 2022-10-27 DIAGNOSIS — M8588 Other specified disorders of bone density and structure, other site: Secondary | ICD-10-CM

## 2022-10-27 DIAGNOSIS — I10 Essential (primary) hypertension: Secondary | ICD-10-CM | POA: Diagnosis not present

## 2022-10-27 DIAGNOSIS — R5383 Other fatigue: Secondary | ICD-10-CM | POA: Diagnosis not present

## 2022-10-27 DIAGNOSIS — R6889 Other general symptoms and signs: Secondary | ICD-10-CM | POA: Diagnosis not present

## 2022-10-27 DIAGNOSIS — G2581 Restless legs syndrome: Secondary | ICD-10-CM | POA: Diagnosis not present

## 2022-10-27 DIAGNOSIS — Z6829 Body mass index (BMI) 29.0-29.9, adult: Secondary | ICD-10-CM

## 2022-10-27 DIAGNOSIS — E785 Hyperlipidemia, unspecified: Secondary | ICD-10-CM

## 2022-10-27 LAB — LIPID PANEL

## 2022-10-27 MED ORDER — PRAMIPEXOLE DIHYDROCHLORIDE 0.25 MG PO TABS: 0.2500 mg | ORAL_TABLET | Freq: Every day | ORAL | 1 refills | Status: DC

## 2022-10-27 MED ORDER — METOPROLOL SUCCINATE ER 25 MG PO TB24: 25.0000 mg | ORAL_TABLET | Freq: Every day | ORAL | 1 refills | Status: DC

## 2022-10-27 MED ORDER — SIMVASTATIN 40 MG PO TABS: 40.0000 mg | ORAL_TABLET | Freq: Every day | ORAL | 1 refills | Status: DC

## 2022-10-27 MED ORDER — ESCITALOPRAM OXALATE 10 MG PO TABS: 10.0000 mg | ORAL_TABLET | Freq: Every day | ORAL | 1 refills | Status: DC

## 2022-10-27 NOTE — Progress Notes (Signed)
Subjective:  husband  Patient ID: Amanda Park, female    DOB: 1946/06/02, 77 y.o.   MRN: [id number]   Chief Complaint: Medical Management of Chronic Issues (Fatigue would like iron checked)    HPI:  Amanda Park is a 77 y.o. who identifies as a female who was assigned female at birth.   Social history: Lives with: husband Work history: retired   Scientist, forensic in today for follow up of the following chronic medical issues:  1. Essential hypertension, benign No c/o chest pain, sob or headache. Does not check blood pressure at home BP Readings from Last 3 Encounters:  10/27/22 135/68  04/27/22 131/66  10/30/21 136/78     2. Hyperlipidemia with target LDL less than 100 Tries  to watch diet and stay active. Does no dedicated exercise. Lab Results  Component Value Date   CHOL 178 04/27/2022   HDL 47 04/27/2022   LDLCALC 105 (H) 04/27/2022   TRIG 150 (H) 04/27/2022   CHOLHDL 3.8 04/27/2022   The 10-year ASCVD risk score (Arnett DK, et al., 2019) is: 27.7%   3. Gastroesophageal reflux disease without esophagitis Use to be on omeprazole but stopped taking . Has had no issues.  4. RLS (restless legs syndrome) Takes mirapex which really helps relax her legs at night so she can sleep  5. GAD (generalized anxiety disorder) Is on lexapro and is dong well.    10/27/2022   10:07 AM 04/27/2022   10:06 AM 10/30/2021    9:47 AM 07/24/2021    8:18 AM  GAD 7 : Generalized Anxiety Score  Nervous, Anxious, on Edge 0 0 0 0  Control/stop worrying 0 0 0 0  Worry too much - different things 0 0 0 0  Trouble relaxing 0 0 0 0  Restless 0 0 0 3  Easily annoyed or irritable 0 0 0 0  Afraid - awful might happen 0 0 0 0  Total GAD 7 Score 0 0 0 3  Anxiety Difficulty Not difficult at all Not difficult at all Not difficult at all Not difficult at all      6. Osteopenia of lumbar spine Last dexascan was done on 04/30/21. Her t score was -2.0  7. BMI 29.0-29.9,adult Weight is up 7lbs Wt  Readings from Last 3 Encounters:  10/27/22 176 lb (79.8 kg)  04/27/22 169 lb (76.7 kg)  02/10/22 170 lb (77.1 kg)   BMI Readings from Last 3 Encounters:  10/27/22 29.29 kg/m  04/27/22 28.12 kg/m  02/10/22 28.29 kg/m     New complaints: Feels like she does not have any  energy. Just wants to seat around and do nothing.  No Known Allergies Outpatient Encounter Medications as of 10/27/2022  Medication Sig   anastrozole (ARIMIDEX) 1 MG tablet Take 1 mg by mouth daily.   aspirin EC 81 MG tablet Take 81 mg by mouth daily.   CALCIUM PO Take by mouth.   cetirizine (ZYRTEC) 10 MG tablet Take 1 tablet (10 mg total) by mouth daily.   escitalopram (LEXAPRO) 10 MG tablet TAKE 1 TABLET (10 MG TOTAL) BY MOUTH DAILY.   ibuprofen (ADVIL) 200 MG tablet Take 400 mg by mouth every other day.   levocetirizine (XYZAL) 5 MG tablet Take 5 mg by mouth every evening.   metoprolol succinate (TOPROL-XL) 25 MG 24 hr tablet TAKE 1 TABLET (25 MG TOTAL) BY MOUTH DAILY.   pramipexole (MIRAPEX) 0.25 MG tablet TAKE 1 TABLET AT BEDTIME   simvastatin (ZOCOR) 40 MG  tablet TAKE 1 TABLET EVERY DAY   traMADol (ULTRAM) 50 MG tablet Take 1 tablet (50 mg total) by mouth every 6 (six) hours as needed.   VITAMIN D PO Take by mouth.   [DISCONTINUED] melatonin 5 MG TABS Take 5 mg by mouth.   [DISCONTINUED] omeprazole (PRILOSEC) 40 MG capsule TAKE 1 CAPSULE (40 MG TOTAL) BY MOUTH DAILY.   No facility-administered encounter medications on file as of 10/27/2022.    Past Surgical History:  Procedure Laterality Date   ABDOMINAL HYSTERECTOMY     BREAST BIOPSY Bilateral 10/24/2020   Fibroadenoma bilaterally   cyst removed from left arm pit  03/15/2017   FOOT SURGERY     TMJ ARTHROPLASTY      Family History  Problem Relation Age of Onset   Cancer Mother 10       cancer   Hypertension Mother    Kidney disease Mother    Dementia Mother    Heart disease Mother    Breast cancer Mother    Heart disease Paternal Grandfather     Prostate cancer Father    Prostate cancer Brother    Stomach cancer Paternal Uncle    Kidney disease Sister    Parkinson's disease Sister    Diabetes Sister    Heart disease Sister    Dementia Sister    Colon cancer Neg Hx       Controlled substance contract: n/a     Review of Systems  Constitutional:  Negative for diaphoresis.  Eyes:  Negative for pain.  Respiratory:  Negative for shortness of breath.   Cardiovascular:  Negative for chest pain, palpitations and leg swelling.  Gastrointestinal:  Negative for abdominal pain.  Endocrine: Negative for polydipsia.  Skin:  Negative for rash.  Neurological:  Negative for dizziness, weakness and headaches.  Hematological:  Does not bruise/bleed easily.  All other systems reviewed and are negative.      Objective:   Physical Exam Vitals and nursing note reviewed.  Constitutional:      General: She is not in acute distress.    Appearance: Normal appearance. She is well-developed.  HENT:     Head: Normocephalic.     Right Ear: Tympanic membrane normal.     Left Ear: Tympanic membrane normal.     Nose: Nose normal.     Mouth/Throat:     Mouth: Mucous membranes are moist.  Eyes:     Pupils: Pupils are equal, round, and reactive to light.  Neck:     Vascular: No carotid bruit or JVD.  Cardiovascular:     Rate and Rhythm: Normal rate and regular rhythm.     Heart sounds: Normal heart sounds.  Pulmonary:     Effort: Pulmonary effort is normal. No respiratory distress.     Breath sounds: Normal breath sounds. No wheezing or rales.  Chest:     Chest wall: No tenderness.  Abdominal:     General: Bowel sounds are normal. There is no distension or abdominal bruit.     Palpations: Abdomen is soft. There is no hepatomegaly, splenomegaly, mass or pulsatile mass.     Tenderness: There is no abdominal tenderness.  Musculoskeletal:        General: Normal range of motion.     Cervical back: Normal range of motion and neck  supple.  Lymphadenopathy:     Cervical: No cervical adenopathy.  Skin:    General: Skin is warm and dry.  Neurological:     Mental Status:  She is alert and oriented to person, place, and time.     Deep Tendon Reflexes: Reflexes are normal and symmetric.  Psychiatric:        Behavior: Behavior normal.        Thought Content: Thought content normal.        Judgment: Judgment normal.    BP 135/68   Pulse (!) 50   Temp (!) 97.1 F (36.2 C) (Temporal)   Resp 20   Ht 5\' 5"  (1.651 m)   Wt 176 lb (79.8 kg)   SpO2 98%   BMI 29.29 kg/m   EKG-sinus bradycardia- -Mary-Margaret Hassell Done, FNP       Assessment & Plan:   Ehrin Shaheed comes in today with chief complaint of Medical Management of Chronic Issues (Fatigue would like iron checked)   Diagnosis and orders addressed:  1. Essential hypertension, benign Low sodium diet - CBC with Differential/Platelet - CMP14+EGFR - metoprolol succinate (TOPROL-XL) 25 MG 24 hr tablet; Take 1 tablet (25 mg total) by mouth daily.  Dispense: 90 tablet; Refill: 1  2. Hyperlipidemia with target LDL less than 100 Low fat diet - Lipid panel - simvastatin (ZOCOR) 40 MG tablet; Take 1 tablet (40 mg total) by mouth daily.  Dispense: 90 tablet; Refill: 1  3. Gastroesophageal reflux disease without esophagitis Avoid spicy foods Do not eat 2 hours prior to bedtime   4. RLS (restless legs syndrome) Keep legs warm at night - pramipexole (MIRAPEX) 0.25 MG tablet; Take 1 tablet (0.25 mg total) by mouth at bedtime.  Dispense: 90 tablet; Refill: 1  5. GAD (generalized anxiety disorder) Stress management - escitalopram (LEXAPRO) 10 MG tablet; Take 1 tablet (10 mg total) by mouth daily.  Dispense: 90 tablet; Refill: 1  6. Osteopenia of lumbar spine Weight bearing exercise as can tolerate  7. BMI 29.0-29.9,adult Discussed diet and exercise for person with BMI >25 Will recheck weight in 3-6 months   8. Other fatigue Labs pending - Thyroid Panel  With TSH - EKG 12-Lead   Labs pending Health Maintenance reviewed Diet and exercise encouraged  Follow up plan: 6 months   [place], FNP

## 2022-10-28 LAB — LIPID PANEL
Chol/HDL Ratio: 3.9 ratio (ref 0.0–4.4)
Cholesterol, Total: 186 mg/dL (ref 100–199)
HDL: 48 mg/dL (ref >39)
LDL Chol Calc (NIH): 110 mg/dL — ABNORMAL HIGH (ref 0–99)
Triglycerides: 161 mg/dL — ABNORMAL HIGH (ref 0–149)
VLDL Cholesterol Cal: 28 mg/dL (ref 5–40)

## 2022-10-28 LAB — THYROID PANEL WITH TSH
Free Thyroxine Index: 2.1 (ref 1.2–4.9)
T3 Uptake Ratio: 27 % (ref 24–39)
T4, Total: 7.6 ug/dL (ref 4.5–12.0)
TSH: 2.26 u[IU]/mL (ref 0.450–4.500)

## 2022-10-28 LAB — FERRITIN: Ferritin: 152 ng/mL — ABNORMAL HIGH (ref 15–150)

## 2022-10-28 LAB — CBC WITH DIFFERENTIAL/PLATELET
Basophils Absolute: 0 10*3/uL (ref 0.0–0.2)
Basos: 1 %
EOS (ABSOLUTE): 0.1 10*3/uL (ref 0.0–0.4)
Eos: 1 %
Hematocrit: 42.9 % (ref 34.0–46.6)
Hemoglobin: 13.9 g/dL (ref 11.1–15.9)
Immature Grans (Abs): 0 10*3/uL (ref 0.0–0.1)
Immature Granulocytes: 0 %
Lymphocytes Absolute: 2 10*3/uL (ref 0.7–3.1)
Lymphs: 33 %
MCH: 28.7 pg (ref 26.6–33.0)
MCHC: 32.4 g/dL (ref 31.5–35.7)
MCV: 89 fL (ref 79–97)
Monocytes Absolute: 0.4 10*3/uL (ref 0.1–0.9)
Monocytes: 6 %
Neutrophils Absolute: 3.5 10*3/uL (ref 1.4–7.0)
Neutrophils: 59 %
Platelets: 190 10*3/uL (ref 150–450)
RBC: 4.85 x10E6/uL (ref 3.77–5.28)
RDW: 13.2 % (ref 11.7–15.4)
WBC: 6 10*3/uL (ref 3.4–10.8)

## 2022-10-28 LAB — CMP14+EGFR
ALT: 13 IU/L (ref 0–32)
AST: 15 IU/L (ref 0–40)
Albumin/Globulin Ratio: 2.3 — ABNORMAL HIGH (ref 1.2–2.2)
Albumin: 4.4 g/dL (ref 3.8–4.8)
Alkaline Phosphatase: 58 IU/L (ref 44–121)
BUN/Creatinine Ratio: 15 (ref 12–28)
BUN: 16 mg/dL (ref 8–27)
Bilirubin Total: 0.5 mg/dL (ref 0.0–1.2)
CO2: 23 mmol/L (ref 20–29)
Calcium: 9.9 mg/dL (ref 8.7–10.3)
Chloride: 105 mmol/L (ref 96–106)
Creatinine, Ser: 1.1 mg/dL — ABNORMAL HIGH (ref 0.57–1.00)
Globulin, Total: 1.9 g/dL (ref 1.5–4.5)
Glucose: 94 mg/dL (ref 70–99)
Potassium: 4.6 mmol/L (ref 3.5–5.2)
Sodium: 141 mmol/L (ref 134–144)
Total Protein: 6.3 g/dL (ref 6.0–8.5)
eGFR: 52 mL/min/{1.73_m2} — ABNORMAL LOW (ref >59)

## 2022-10-29 MED ORDER — ROSUVASTATIN CALCIUM 10 MG PO TABS: 10.0000 mg | ORAL_TABLET | Freq: Every day | ORAL | 3 refills | Status: DC

## 2022-10-29 NOTE — Addendum Note (Signed)
Addended by: [name] on: 10/29/2022 11:01 AM   Modules accepted: Orders

## 2022-11-06 ENCOUNTER — Encounter: Payer: Self-pay | Admitting: Gastroenterology

## 2022-12-14 ENCOUNTER — Encounter: Payer: Self-pay | Admitting: Gastroenterology

## 2022-12-24 DIAGNOSIS — Z853 Personal history of malignant neoplasm of breast: Secondary | ICD-10-CM | POA: Diagnosis not present

## 2022-12-24 DIAGNOSIS — R92332 Mammographic heterogeneous density, left breast: Secondary | ICD-10-CM | POA: Diagnosis not present

## 2022-12-24 DIAGNOSIS — Z08 Encounter for follow-up examination after completed treatment for malignant neoplasm: Secondary | ICD-10-CM | POA: Diagnosis not present

## 2022-12-24 DIAGNOSIS — C50912 Malignant neoplasm of unspecified site of left female breast: Secondary | ICD-10-CM | POA: Diagnosis not present

## 2022-12-25 DIAGNOSIS — C50912 Malignant neoplasm of unspecified site of left female breast: Secondary | ICD-10-CM | POA: Diagnosis not present

## 2022-12-25 DIAGNOSIS — Z79811 Long term (current) use of aromatase inhibitors: Secondary | ICD-10-CM | POA: Diagnosis not present

## 2022-12-25 DIAGNOSIS — Z5181 Encounter for therapeutic drug level monitoring: Secondary | ICD-10-CM | POA: Diagnosis not present

## 2022-12-25 DIAGNOSIS — Z780 Asymptomatic menopausal state: Secondary | ICD-10-CM | POA: Diagnosis not present

## 2022-12-29 ENCOUNTER — Telehealth: Payer: Self-pay | Admitting: Nurse Practitioner

## 2022-12-29 DIAGNOSIS — Z1211 Encounter for screening for malignant neoplasm of colon: Secondary | ICD-10-CM

## 2022-12-29 NOTE — Telephone Encounter (Signed)
REFERRAL REQUEST Telephone Note  Have you been seen at our office for this problem? Yes, pt received letter from [facility] telling she was due for colonscopy  (Advise that they may need an appointment with their PCP before a referral can be done)  Reason for Referral: colonoscopy Referral discussed with patient: yes  Best contact number of patient for referral team: [phone]    Has patient been seen by a specialist for this issue before: yes  Patient provider preference for referral: [facility] Patient location preference for referral: [place]    Patient notified that referrals can take up to a week or longer to process. If they haven't heard anything within a week they should call back and speak with the referral department.

## 2022-12-30 NOTE — Telephone Encounter (Signed)
Wil you please do referral for colonoscopy with [facility]

## 2022-12-31 NOTE — Telephone Encounter (Signed)
Referral placed.

## 2023-01-01 ENCOUNTER — Encounter: Payer: Self-pay | Admitting: Gastroenterology

## 2023-01-12 DIAGNOSIS — I209 Angina pectoris, unspecified: Secondary | ICD-10-CM | POA: Diagnosis not present

## 2023-01-12 DIAGNOSIS — R0602 Shortness of breath: Secondary | ICD-10-CM | POA: Diagnosis not present

## 2023-01-12 DIAGNOSIS — I10 Essential (primary) hypertension: Secondary | ICD-10-CM | POA: Diagnosis not present

## 2023-01-12 DIAGNOSIS — R002 Palpitations: Secondary | ICD-10-CM | POA: Diagnosis not present

## 2023-01-29 DIAGNOSIS — C50912 Malignant neoplasm of unspecified site of left female breast: Secondary | ICD-10-CM | POA: Diagnosis not present

## 2023-02-02 IMAGING — DX DG KNEE 1-2V*L*
2 series · 2 of 2 positions shown · non-contrast
Comparison: X-ray knee 04/29/2020.

CLINICAL DATA: Left knee pain.

EXAM:
LEFT KNEE - 1-2 VIEW

[knee ap]
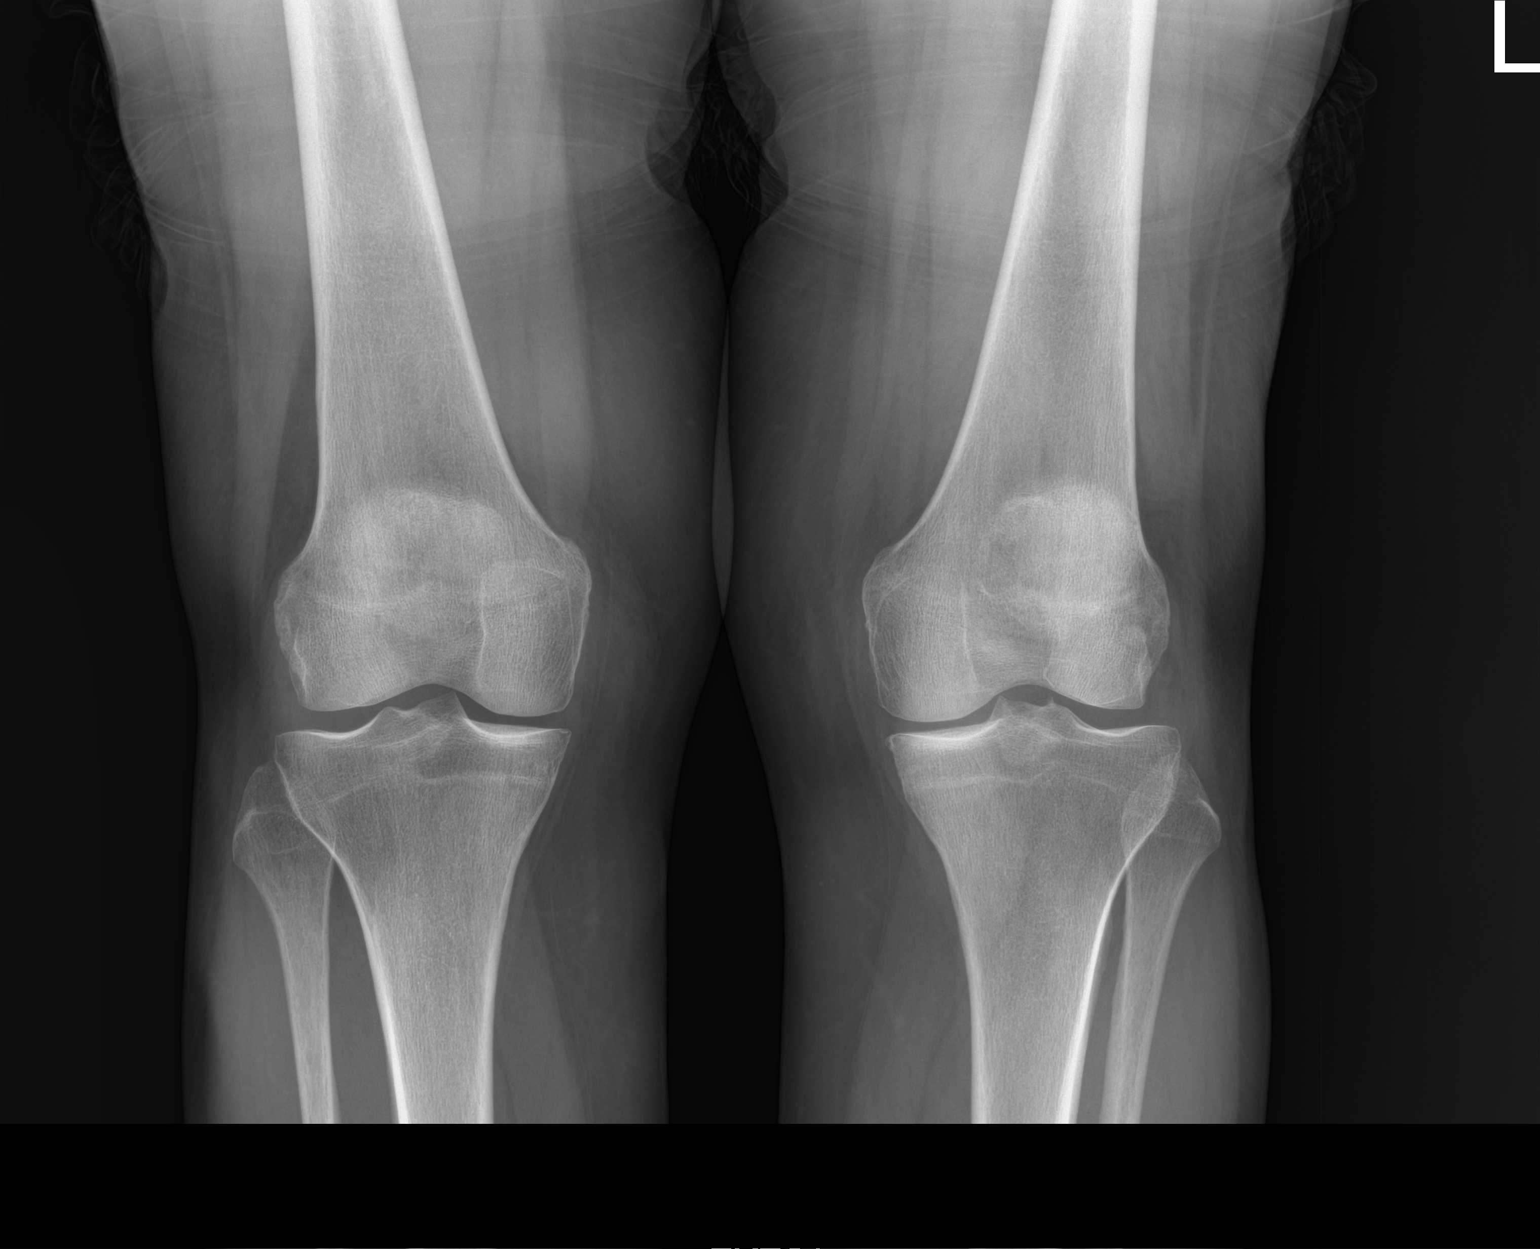

[knee lat]
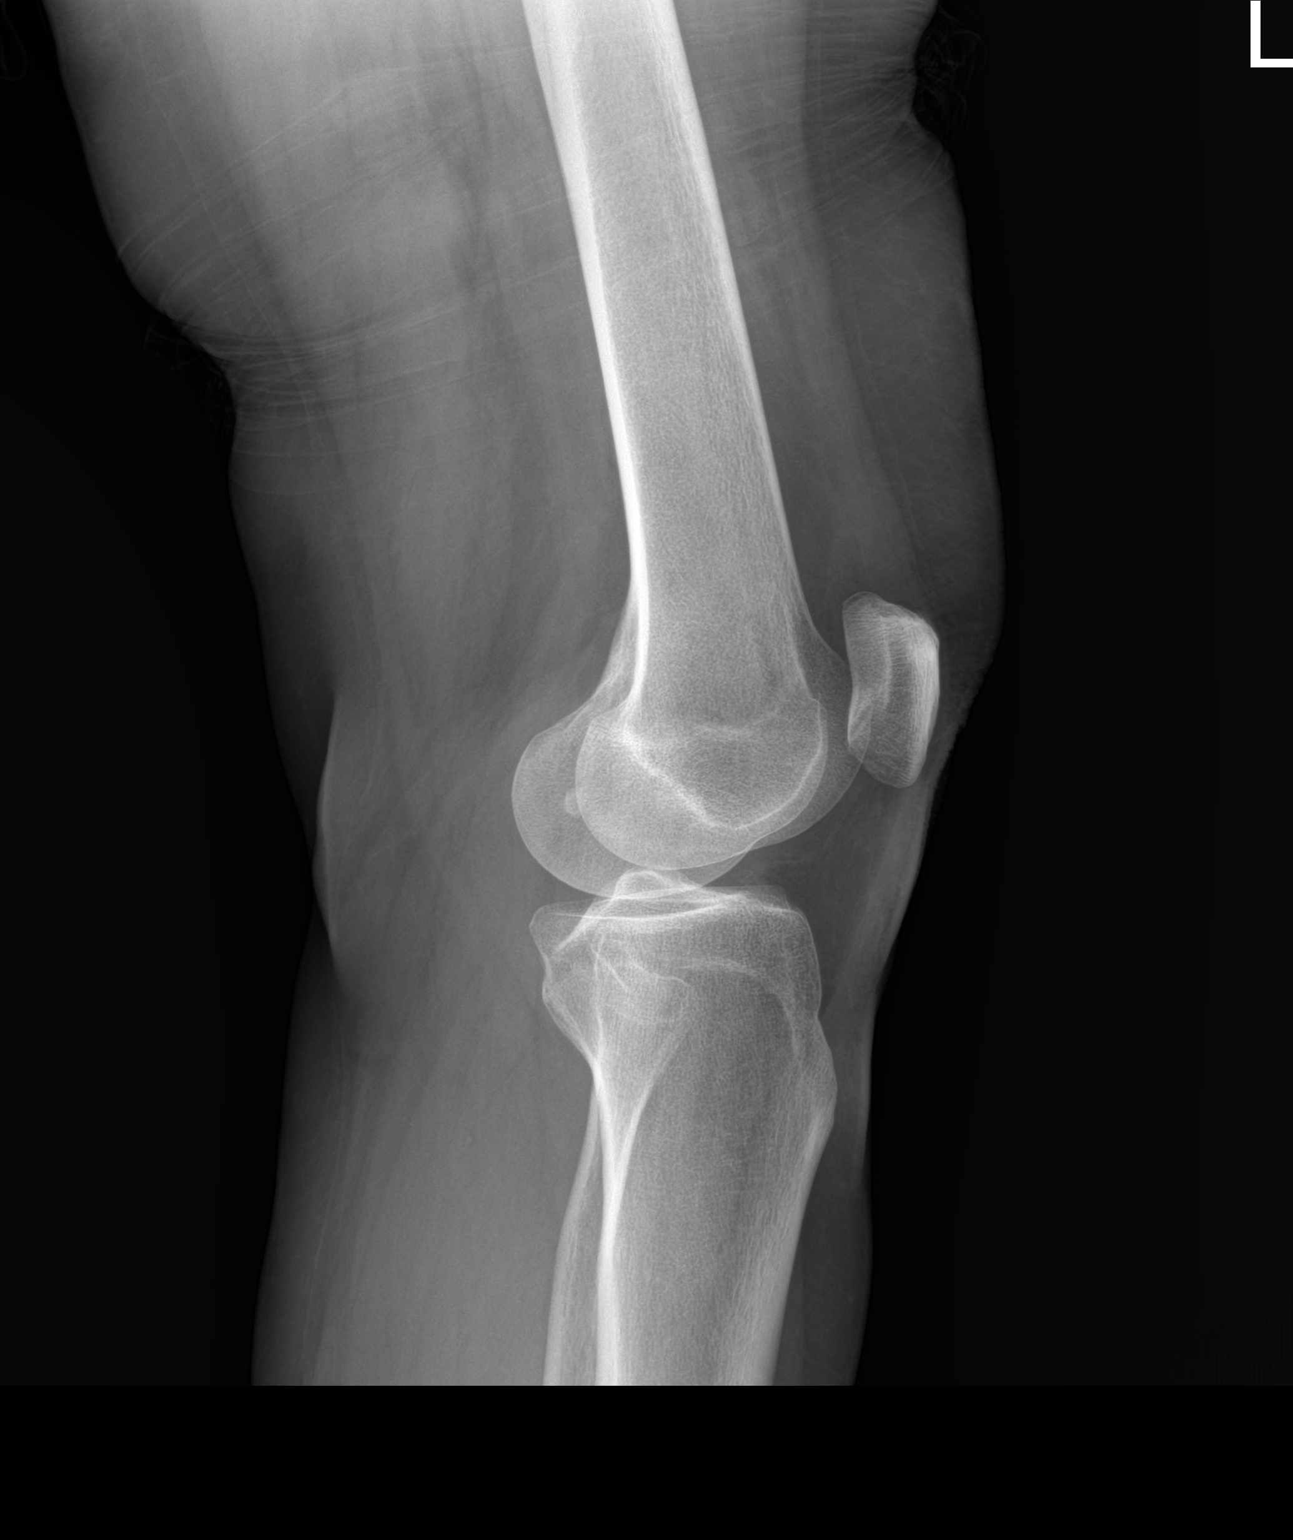

[2 of 2 positions shown; findings below may reference images not displayed]

FINDINGS: No fracture or dislocation is seen. There is no significant
effusion. No significant degenerative changes are noted. No
significant interval changes are noted.
IMPRESSION: No radiographic abnormality is seen in the left knee.

## 2023-02-03 ENCOUNTER — Telehealth: Payer: Self-pay | Admitting: *Deleted

## 2023-02-03 ENCOUNTER — Encounter: Payer: Self-pay | Admitting: Gastroenterology

## 2023-02-03 NOTE — Telephone Encounter (Signed)
While prepping chart for PV, noted per recall assessment sheet, Dr. Meridee Score would like the pt to have an office appointment prior to procedure d/t age.  Attempted to reach pt to make appt.  No answer.  LMOM to call back

## 2023-02-03 NOTE — Telephone Encounter (Signed)
Inbound call from patient regarding previous note. Patient has been scheduled for 10/9 with Dr. Meridee Score. Pre visit and colonoscopy has been cancelled.

## 2023-02-15 ENCOUNTER — Ambulatory Visit: Payer: Medicare HMO

## 2023-02-15 VITALS — Ht 65.0 in | Wt 172.0 lb

## 2023-02-15 DIAGNOSIS — Z0000 Encounter for general adult medical examination without abnormal findings: Secondary | ICD-10-CM

## 2023-02-15 NOTE — Progress Notes (Signed)
Subjective:   Amanda Park is a 77 y.o. female who presents for Medicare Annual (Subsequent) preventive examination.  Visit Complete: Virtual  I connected with  Amanda Park on 02/15/23 by a audio enabled telemedicine application and verified that I am speaking with the correct person using two identifiers.  Patient Location: Home  Provider Location: Home Office  I discussed the limitations of evaluation and management by telemedicine. The patient expressed understanding and agreed to proceed.  Patient Medicare AWV questionnaire was completed by the patient on 02/15/2023; I have confirmed that all information answered by patient is correct and no changes since this date.  Review of Systems     Cardiac Risk Factors include: advanced age (>86men, >28 women);dyslipidemia     Objective:    Today's Vitals   02/15/23 0949  Weight: 172 lb (78 kg)  Height: 5\' 5"  (1.651 m)   Body mass index is 28.62 kg/m.     02/15/2023    9:52 AM 02/10/2022    9:53 AM 11/26/2020    2:10 PM 10/26/2019    1:46 PM 11/15/2015   10:37 AM 10/31/2015   10:06 AM 09/11/2014    4:56 PM  Advanced Directives  Does Patient Have a Medical Advance Directive? Yes No No No No No No  Type of Estate agent of [place];Living will        Copy of Healthcare Power of Attorney in Chart? No - copy requested        Would patient like information on creating a medical advance directive?  No - Patient declined Yes (MAU/Ambulatory/Procedural Areas - Information given) Yes (MAU/Ambulatory/Procedural Areas - Information given) No - patient declined information No - patient declined information No - patient declined information    Current Medications (verified) Outpatient Encounter Medications as of 02/15/2023  Medication Sig   anastrozole (ARIMIDEX) 1 MG tablet Take 1 mg by mouth daily.   aspirin EC 81 MG tablet Take 81 mg by mouth daily.   CALCIUM PO Take by mouth.   cetirizine (ZYRTEC) 10 MG tablet  Take 1 tablet (10 mg total) by mouth daily.   escitalopram (LEXAPRO) 10 MG tablet Take 1 tablet (10 mg total) by mouth daily.   levocetirizine (XYZAL) 5 MG tablet Take 5 mg by mouth every evening.   metoprolol succinate (TOPROL-XL) 25 MG 24 hr tablet Take 1 tablet (25 mg total) by mouth daily.   pramipexole (MIRAPEX) 0.25 MG tablet Take 1 tablet (0.25 mg total) by mouth at bedtime.   rosuvastatin (CRESTOR) 10 MG tablet Take 1 tablet (10 mg total) by mouth daily.   VITAMIN D PO Take by mouth.   ibuprofen (ADVIL) 200 MG tablet Take 400 mg by mouth every other day.   No facility-administered encounter medications on file as of 02/15/2023.    Allergies (verified) Patient has no known allergies.   History: Past Medical History:  Diagnosis Date   Allergy    Hyperlipidemia    Hypertension    Osteopenia    PVC (premature ventricular contraction)    Past Surgical History:  Procedure Laterality Date   ABDOMINAL HYSTERECTOMY     BREAST BIOPSY Bilateral 10/24/2020   Fibroadenoma bilaterally   cyst removed from left arm pit  03/15/2017   FOOT SURGERY     TMJ ARTHROPLASTY     Family History  Problem Relation Age of Onset   Hypertension Mother    Kidney disease Mother    Dementia Mother    Heart disease Mother  Breast cancer Mother    Prostate cancer Father    Kidney disease Sister    Parkinson's disease Sister    Diabetes Sister    Heart disease Sister    Dementia Sister    Prostate cancer Brother    Heart disease Paternal Grandfather    Stomach cancer Paternal Uncle    Colon cancer Neg Hx    Social History   Socioeconomic History   Marital status: Married    Spouse name: Amanda Park   Number of children: 2   Years of education: 12   Highest education level: 12th grade  Occupational History   Occupation: Retired  Tobacco Use   Smoking status: Never   Smokeless tobacco: Never  Vaping Use   Vaping status: Never Used  Substance and Sexual Activity   Alcohol use: No     Alcohol/week: 0.0 standard drinks of alcohol   Drug use: No   Sexual activity: Not Currently  Other Topics Concern   Not on file  Social History Narrative   Married, lives with husband, very active in church, outdoors, gardening   Her washer and dryer and sewing machine are in the basement - she uses the stairs several times per day   Social Determinants of Health   Financial Resource Strain: Low Risk  (02/15/2023)   Overall Financial Resource Strain (CARDIA)    Difficulty of Paying Living Expenses: Not hard at all  Food Insecurity: No Food Insecurity (02/15/2023)   Hunger Vital Sign    Worried About Running Out of Food in the Last Year: Never true    Ran Out of Food in the Last Year: Never true  Transportation Needs: No Transportation Needs (02/15/2023)   PRAPARE - Administrator, Civil Service (Medical): No    Lack of Transportation (Non-Medical): No  Physical Activity: Insufficiently Active (02/15/2023)   Exercise Vital Sign    Days of Exercise per Week: 3 days    Minutes of Exercise per Session: 30 min  Stress: No Stress Concern Present (02/15/2023)   [employer] of Occupational Health - Occupational Stress Questionnaire    Feeling of Stress : Not at all  Social Connections: Socially Integrated (02/15/2023)   Social Connection and Isolation Panel [NHANES]    Frequency of Communication with Friends and Family: More than three times a week    Frequency of Social Gatherings with Friends and Family: More than three times a week    Attends Religious Services: More than 4 times per year    Active Member of [employer] or Organizations: Yes    Attends Engineer, structural: More than 4 times per year    Marital Status: Married    Tobacco Counseling Counseling given: Not Answered   Clinical Intake:  Pre-visit preparation completed: Yes  Pain : No/denies pain     Nutritional Risks: None Diabetes: No  How often do you need to have someone help you when  you read instructions, pamphlets, or other written materials from your doctor or pharmacy?: 1 - Never  Interpreter Needed?: No  Information entered by :: Amanda Park, Amanda Park   Activities of Daily Living    02/15/2023    9:52 AM 02/14/2023    7:05 PM  In your present state of health, do you have any difficulty performing the following activities:  Hearing? 0 0  Vision? 0 0  Difficulty concentrating or making decisions? 0 0  Walking or climbing stairs? 0 1  Dressing or bathing? 0 0  Doing errands, shopping? 0 0  Preparing Food and eating ? N N  Using the Toilet? N N  In the past six months, have you accidently leaked urine? N N  Do you have problems with loss of bowel control? N N  Managing your Medications? N N  Managing your Finances? N N  Housekeeping or managing your Housekeeping? N Y    Patient Care Team: Bennie Pierini, FNP as PCP - General (Nurse Practitioner)  Indicate any recent Medical Services you may have received from other than [facility] providers in the past year (date may be approximate).     Assessment:   This is a routine wellness examination for Amanda Park.  Hearing/Vision screen Vision Screening - Comments:: Wears rx glasses - up to date with routine eye exams with  Dr.Johnson   Dietary issues and exercise activities discussed:     Goals Addressed             This Visit's Progress    DIET - INCREASE WATER INTAKE         Depression Screen    02/15/2023    9:51 AM 10/27/2022   10:07 AM 04/27/2022   10:05 AM 02/10/2022    9:52 AM 10/30/2021    9:47 AM 07/24/2021    8:18 AM 04/30/2021    9:38 AM  PHQ 2/9 Scores  PHQ - 2 Score 0 3 1 0 0 2 3  PHQ- 9 Score  9 1  3 5 12     Fall Risk    02/15/2023    9:49 AM 02/14/2023    7:05 PM 10/27/2022   10:07 AM 02/10/2022    9:50 AM 10/30/2021    9:47 AM  Fall Risk   Falls in the past year? 0 0 0 0 0  Number falls in past yr: 0   0   Injury with Fall? 0 0  0   Risk for fall due to : No Fall Risks   Orthopedic  patient;Impaired balance/gait   Follow up Falls prevention discussed   Falls prevention discussed     MEDICARE RISK AT HOME:  Medicare Risk at Home - 02/15/23 0950     Any stairs in or around the home? Yes    If so, are there any without handrails? No    Home free of loose throw rugs in walkways, pet beds, electrical cords, etc? Yes    Adequate lighting in your home to reduce risk of falls? Yes    Life alert? No    Use of a cane, walker or w/c? No    Grab bars in the bathroom? Yes    Shower chair or bench in shower? Yes    Elevated toilet seat or a handicapped toilet? Yes             TIMED UP AND GO:  Was the test performed?  No    Cognitive Function:        02/15/2023    9:53 AM 02/10/2022    9:54 AM  6CIT Screen  What Year? 0 points 0 points  What month? 0 points 0 points  What time? 0 points 0 points  Count back from 20 0 points 0 points  Months in reverse 0 points 0 points  Repeat phrase 0 points 0 points  Total Score 0 points 0 points    Immunizations Immunization History  Administered Date(s) Administered   Fluad Quad(high Dose 65+) 04/26/2019, 04/29/2020, 04/30/2021, 04/27/2022   Influenza Split 04/20/2013  Influenza, High Dose Seasonal PF 04/30/2016, 04/21/2017, 04/22/2018   Influenza,inj,Quad PF,6+ Mos 04/17/2014, 05/07/2015   Moderna SARS-COV2 Booster Vaccination 05/23/2020   Moderna Sars-Covid-2 Vaccination 08/19/2019, 09/21/2019, 06/16/2022   Pneumococcal Conjugate-13 03/29/2015   Pneumococcal Polysaccharide-23 06/27/2011, 04/26/2019   Tdap 10/30/2021   Zoster Recombinant(Shingrix) 06/27/2019, 04/30/2021   Zoster, Live 04/24/2011    TDAP status: Up to date  Flu Vaccine status: Up to date  Pneumococcal vaccine status: Up to date  Covid-19 vaccine status: Completed vaccines  Qualifies for Shingles Vaccine? Yes   Zostavax completed Yes   Shingrix Completed?: Yes  Screening Tests Health Maintenance  Topic Date Due   COVID-19 Vaccine (4  - 2023-24 season) 08/11/2022   Colonoscopy  11/15/2022   MAMMOGRAM  04/28/2023 (Originally 04/22/2022)   INFLUENZA VACCINE  02/25/2023   DEXA SCAN  05/01/2023   Medicare Annual Wellness (AWV)  02/15/2024   DTaP/Tdap/Td (2 - Td or Tdap) 10/31/2031   Pneumonia Vaccine 18+ Years old  Completed   Hepatitis C Screening  Completed   Zoster Vaccines- Shingrix  Completed   HPV VACCINES  Aged Out    Health Maintenance  Health Maintenance Due  Topic Date Due   COVID-19 Vaccine (4 - 2023-24 season) 08/11/2022   Colonoscopy  11/15/2022    Colorectal cancer screening: No longer required.   Mammogram status: No longer required due to age.  Bone Density status: Completed 04/30/2021. Results reflect: Bone density results: OSTEOPOROSIS. Repeat every 2 years.  Lung Cancer Screening: (Low Dose CT Chest recommended if Age 37-80 years, 20 pack-year currently smoking OR have quit w/in 15years.) does not qualify.   Lung Cancer Screening Referral: n/a  Additional Screening:  Hepatitis C Screening: does not qualify; Completed 03/27/2016  Vision Screening: Recommended annual ophthalmology exams for early detection of glaucoma and other disorders of the eye. Is the patient up to date with their annual eye exam?  Yes  Who is the provider or what is the name of the office in which the patient attends annual eye exams? Dr.Johnson  If pt is not established with a provider, would they like to be referred to a provider to establish care? No .   Dental Screening: Recommended annual dental exams for proper oral hygiene    Community Resource Referral / Chronic Care Management: CRR required this visit?  No   CCM required this visit?  No     Plan:     I have personally reviewed and noted the following in the patient's chart:   Medical and social history Use of alcohol, tobacco or illicit drugs  Current medications and supplements including opioid prescriptions. Patient is not currently taking  opioid prescriptions. Functional ability and status Nutritional status Physical activity Advanced directives List of other physicians Hospitalizations, surgeries, and ER visits in previous 12 months Vitals Screenings to include cognitive, depression, and falls Referrals and appointments  In addition, I have reviewed and discussed with patient certain preventive protocols, quality metrics, and best practice recommendations. A written personalized care plan for preventive services as well as general preventive health recommendations were provided to patient.     Lorrene Reid, Amanda Park   [birth year]   After Visit Summary: (MyChart) Due to this being a telephonic visit, the after visit summary with patients personalized plan was offered to patient via MyChart   Nurse Notes: none    I have reviewed and agree with the above AWV documentation.   Mary-Margaret Daphine Deutscher, FNP

## 2023-02-15 NOTE — Patient Instructions (Signed)
Amanda Park , Thank you for taking time to come for your Medicare Wellness Visit. I appreciate your ongoing commitment to your health goals. Please review the following plan we discussed and let me know if I can assist you in the future.   These are the goals we discussed:  Goals      DIET - INCREASE WATER INTAKE     Patient Stated     Wants to get her strength and energy back so she can do more        This is a list of the screening recommended for you and due dates:  Health Maintenance  Topic Date Due   COVID-19 Vaccine (4 - 2023-24 season) 08/11/2022   Colon Cancer Screening  11/15/2022   Mammogram  04/28/2023*   Flu Shot  02/25/2023   DEXA scan (bone density measurement)  05/01/2023   Medicare Annual Wellness Visit  02/15/2024   DTaP/Tdap/Td vaccine (2 - Td or Tdap) 10/31/2031   Pneumonia Vaccine  Completed   Hepatitis C Screening  Completed   Zoster (Shingles) Vaccine  Completed   HPV Vaccine  Aged Out  *Topic was postponed. The date shown is not the original due date.    Advanced directives: Advance directive discussed with you today. I have provided a copy for you to complete at home and have notarized. Once this is complete please bring a copy in to our office so we can scan it into your chart. Information on Advanced Care Planning can be found at [facility] Advance Health Care Directives Advance Health Care Directives ([email or web address]    Conditions/risks identified: Aim for 30 minutes of exercise or brisk walking, 6-8 glasses of water, and 5 servings of fruits and vegetables each day.   Next appointment: Follow up in one year for your annual wellness visit    Preventive Care 65 Years and Older, Female Preventive care refers to lifestyle choices and visits with your health care provider that can promote health and wellness. What does preventive care include? A yearly physical exam. This is also called an annual well check. Dental exams once or twice a  year. Routine eye exams. Ask your health care provider how often you should have your eyes checked. Personal lifestyle choices, including: Daily care of your teeth and gums. Regular physical activity. Eating a healthy diet. Avoiding tobacco and drug use. Limiting alcohol use. Practicing safe sex. Taking low-dose aspirin every day. Taking vitamin and mineral supplements as recommended by your health care provider. What happens during an annual well check? The services and screenings done by your health care provider during your annual well check will depend on your age, overall health, lifestyle risk factors, and family history of disease. Counseling  Your health care provider may ask you questions about your: Alcohol use. Tobacco use. Drug use. Emotional well-being. Home and relationship well-being. Sexual activity. Eating habits. History of falls. Memory and ability to understand (cognition). Work and work Astronomer. Reproductive health. Screening  You may have the following tests or measurements: Height, weight, and BMI. Blood pressure. Lipid and cholesterol levels. These may be checked every 5 years, or more frequently if you are over 24 years old. Skin check. Lung cancer screening. You may have this screening every year starting at age 23 if you have a 30-pack-year history of smoking and currently smoke or have quit within the past 15 years. Fecal occult blood test (FOBT) of the stool. You may have this test every year starting  at age 38. Flexible sigmoidoscopy or colonoscopy. You may have a sigmoidoscopy every 5 years or a colonoscopy every 10 years starting at age 52. Hepatitis C blood test. Hepatitis B blood test. Sexually transmitted disease (STD) testing. Diabetes screening. This is done by checking your blood sugar (glucose) after you have not eaten for a while (fasting). You may have this done every 1-3 years. Bone density scan. This is done to screen for  osteoporosis. You may have this done starting at age 57. Mammogram. This may be done every 1-2 years. Talk to your health care provider about how often you should have regular mammograms. Talk with your health care provider about your test results, treatment options, and if necessary, the need for more tests. Vaccines  Your health care provider may recommend certain vaccines, such as: Influenza vaccine. This is recommended every year. Tetanus, diphtheria, and acellular pertussis (Tdap, Td) vaccine. You may need a Td booster every 10 years. Zoster vaccine. You may need this after age 38. Pneumococcal 13-valent conjugate (PCV13) vaccine. One dose is recommended after age 40. Pneumococcal polysaccharide (PPSV23) vaccine. One dose is recommended after age 22. Talk to your health care provider about which screenings and vaccines you need and how often you need them. This information is not intended to replace advice given to you by your health care provider. Make sure you discuss any questions you have with your health care provider. Document Released: 08/09/2015 Document Revised: 04/01/2016 Document Reviewed: 05/14/2015 Elsevier Interactive Patient Education  2017 [employer].  Fall Prevention in the Home Falls can cause injuries. They can happen to people of all ages. There are many things you can do to make your home safe and to help prevent falls. What can I do on the outside of my home? Regularly fix the edges of walkways and driveways and fix any cracks. Remove anything that might make you trip as you walk through a door, such as a raised step or threshold. Trim any bushes or trees on the path to your home. Use bright outdoor lighting. Clear any walking paths of anything that might make someone trip, such as rocks or tools. Regularly check to see if handrails are loose or broken. Make sure that both sides of any steps have handrails. Any raised decks and porches should have guardrails on  the edges. Have any leaves, snow, or ice cleared regularly. Use sand or salt on walking paths during winter. Clean up any spills in your garage right away. This includes oil or grease spills. What can I do in the bathroom? Use night lights. Install grab bars by the toilet and in the tub and shower. Do not use towel bars as grab bars. Use non-skid mats or decals in the tub or shower. If you need to sit down in the shower, use a plastic, non-slip stool. Keep the floor dry. Clean up any water that spills on the floor as soon as it happens. Remove soap buildup in the tub or shower regularly. Attach bath mats securely with double-sided non-slip rug tape. Do not have throw rugs and other things on the floor that can make you trip. What can I do in the bedroom? Use night lights. Make sure that you have a light by your bed that is easy to reach. Do not use any sheets or blankets that are too big for your bed. They should not hang down onto the floor. Have a firm chair that has side arms. You can use this for support  while you get dressed. Do not have throw rugs and other things on the floor that can make you trip. What can I do in the kitchen? Clean up any spills right away. Avoid walking on wet floors. Keep items that you use a lot in easy-to-reach places. If you need to reach something above you, use a strong step stool that has a grab bar. Keep electrical cords out of the way. Do not use floor polish or wax that makes floors slippery. If you must use wax, use non-skid floor wax. Do not have throw rugs and other things on the floor that can make you trip. What can I do with my stairs? Do not leave any items on the stairs. Make sure that there are handrails on both sides of the stairs and use them. Fix handrails that are broken or loose. Make sure that handrails are as long as the stairways. Check any carpeting to make sure that it is firmly attached to the stairs. Fix any carpet that is loose  or worn. Avoid having throw rugs at the top or bottom of the stairs. If you do have throw rugs, attach them to the floor with carpet tape. Make sure that you have a light switch at the top of the stairs and the bottom of the stairs. If you do not have them, ask someone to add them for you. What else can I do to help prevent falls? Wear shoes that: Do not have high heels. Have rubber bottoms. Are comfortable and fit you well. Are closed at the toe. Do not wear sandals. If you use a stepladder: Make sure that it is fully opened. Do not climb a closed stepladder. Make sure that both sides of the stepladder are locked into place. Ask someone to hold it for you, if possible. Clearly mark and make sure that you can see: Any grab bars or handrails. First and last steps. Where the edge of each step is. Use tools that help you move around (mobility aids) if they are needed. These include: Canes. Walkers. Scooters. Crutches. Turn on the lights when you go into a dark area. Replace any light bulbs as soon as they burn out. Set up your furniture so you have a clear path. Avoid moving your furniture around. If any of your floors are uneven, fix them. If there are any pets around you, be aware of where they are. Review your medicines with your doctor. Some medicines can make you feel dizzy. This can increase your chance of falling. Ask your doctor what other things that you can do to help prevent falls. This information is not intended to replace advice given to you by your health care provider. Make sure you discuss any questions you have with your health care provider. Document Released: 05/09/2009 Document Revised: 12/19/2015 Document Reviewed: 08/17/2014 Elsevier Interactive Patient Education  2017 [employer].

## 2023-03-02 DIAGNOSIS — L570 Actinic keratosis: Secondary | ICD-10-CM | POA: Diagnosis not present

## 2023-03-02 DIAGNOSIS — L579 Skin changes due to chronic exposure to nonionizing radiation, unspecified: Secondary | ICD-10-CM | POA: Diagnosis not present

## 2023-03-02 DIAGNOSIS — D485 Neoplasm of uncertain behavior of skin: Secondary | ICD-10-CM | POA: Diagnosis not present

## 2023-03-02 DIAGNOSIS — L821 Other seborrheic keratosis: Secondary | ICD-10-CM | POA: Diagnosis not present

## 2023-03-02 DIAGNOSIS — L814 Other melanin hyperpigmentation: Secondary | ICD-10-CM | POA: Diagnosis not present

## 2023-03-02 DIAGNOSIS — L720 Epidermal cyst: Secondary | ICD-10-CM | POA: Diagnosis not present

## 2023-03-02 DIAGNOSIS — D235 Other benign neoplasm of skin of trunk: Secondary | ICD-10-CM | POA: Diagnosis not present

## 2023-03-11 DIAGNOSIS — R0602 Shortness of breath: Secondary | ICD-10-CM | POA: Diagnosis not present

## 2023-03-11 DIAGNOSIS — I10 Essential (primary) hypertension: Secondary | ICD-10-CM | POA: Diagnosis not present

## 2023-03-11 DIAGNOSIS — R001 Bradycardia, unspecified: Secondary | ICD-10-CM | POA: Diagnosis not present

## 2023-03-11 DIAGNOSIS — R079 Chest pain, unspecified: Secondary | ICD-10-CM | POA: Diagnosis not present

## 2023-03-11 DIAGNOSIS — I209 Angina pectoris, unspecified: Secondary | ICD-10-CM | POA: Diagnosis not present

## 2023-03-16 ENCOUNTER — Encounter: Payer: Medicare HMO | Admitting: Gastroenterology

## 2023-03-26 DIAGNOSIS — I10 Essential (primary) hypertension: Secondary | ICD-10-CM | POA: Diagnosis not present

## 2023-03-26 DIAGNOSIS — I088 Other rheumatic multiple valve diseases: Secondary | ICD-10-CM | POA: Diagnosis not present

## 2023-03-26 DIAGNOSIS — I209 Angina pectoris, unspecified: Secondary | ICD-10-CM | POA: Diagnosis not present

## 2023-03-26 DIAGNOSIS — R0602 Shortness of breath: Secondary | ICD-10-CM | POA: Diagnosis not present

## 2023-03-26 DIAGNOSIS — I081 Rheumatic disorders of both mitral and tricuspid valves: Secondary | ICD-10-CM | POA: Diagnosis not present

## 2023-04-08 DIAGNOSIS — R9431 Abnormal electrocardiogram [ECG] [EKG]: Secondary | ICD-10-CM | POA: Diagnosis not present

## 2023-05-03 ENCOUNTER — Ambulatory Visit: Payer: Medicare HMO | Admitting: Nurse Practitioner

## 2023-05-05 ENCOUNTER — Other Ambulatory Visit: Payer: Self-pay | Admitting: Nurse Practitioner

## 2023-05-05 ENCOUNTER — Ambulatory Visit: Payer: Medicare HMO | Admitting: Gastroenterology

## 2023-05-05 DIAGNOSIS — F411 Generalized anxiety disorder: Secondary | ICD-10-CM

## 2023-05-05 DIAGNOSIS — G2581 Restless legs syndrome: Secondary | ICD-10-CM

## 2023-05-05 DIAGNOSIS — I10 Essential (primary) hypertension: Secondary | ICD-10-CM

## 2023-05-17 ENCOUNTER — Encounter: Payer: Self-pay | Admitting: Nurse Practitioner

## 2023-05-17 ENCOUNTER — Ambulatory Visit (INDEPENDENT_AMBULATORY_CARE_PROVIDER_SITE_OTHER): Payer: Medicare HMO | Admitting: Nurse Practitioner

## 2023-05-17 VITALS — BP 132/82 | HR 51 | Temp 96.8°F | Resp 20 | Ht 65.0 in | Wt 172.0 lb

## 2023-05-17 DIAGNOSIS — C50912 Malignant neoplasm of unspecified site of left female breast: Secondary | ICD-10-CM | POA: Diagnosis not present

## 2023-05-17 DIAGNOSIS — K219 Gastro-esophageal reflux disease without esophagitis: Secondary | ICD-10-CM | POA: Diagnosis not present

## 2023-05-17 DIAGNOSIS — Z23 Encounter for immunization: Secondary | ICD-10-CM

## 2023-05-17 DIAGNOSIS — Z6829 Body mass index (BMI) 29.0-29.9, adult: Secondary | ICD-10-CM

## 2023-05-17 DIAGNOSIS — I10 Essential (primary) hypertension: Secondary | ICD-10-CM | POA: Diagnosis not present

## 2023-05-17 DIAGNOSIS — G2581 Restless legs syndrome: Secondary | ICD-10-CM | POA: Diagnosis not present

## 2023-05-17 DIAGNOSIS — F411 Generalized anxiety disorder: Secondary | ICD-10-CM

## 2023-05-17 DIAGNOSIS — E785 Hyperlipidemia, unspecified: Secondary | ICD-10-CM

## 2023-05-17 MED ORDER — METOPROLOL SUCCINATE ER 25 MG PO TB24: 25.0000 mg | ORAL_TABLET | Freq: Every day | ORAL | 1 refills | Status: DC

## 2023-05-17 MED ORDER — ESCITALOPRAM OXALATE 10 MG PO TABS: 10.0000 mg | ORAL_TABLET | Freq: Every day | ORAL | 1 refills | Status: DC

## 2023-05-17 MED ORDER — PRAMIPEXOLE DIHYDROCHLORIDE 0.25 MG PO TABS: 0.2500 mg | ORAL_TABLET | Freq: Every day | ORAL | 1 refills | Status: DC

## 2023-05-17 MED ORDER — ROSUVASTATIN CALCIUM 10 MG PO TABS: 10.0000 mg | ORAL_TABLET | Freq: Every day | ORAL | 1 refills | Status: DC

## 2023-05-17 NOTE — Progress Notes (Signed)
Subjective:    Patient ID: Amanda Park, female    DOB: 11-May-1946, 77 y.o.   MRN: [id number]   Chief Complaint: Medical Management of Chronic Issues (Discuss colonoscopy appt)    HPI:  Amanda Park is a 77 y.o. who identifies as a female who was assigned female at birth.   Social history: Lives with: husband Work history: retired   Water engineer in today for follow up of the following chronic medical issues:  1. Essential hypertension, benign No c/o chest pain, sob or headache. Does not check blood pressure at home. BP Readings from Last 3 Encounters:  10/27/22 135/68  04/27/22 131/66  10/30/21 136/78     2. Hyperlipidemia with target LDL less than 100 Does try to watch diet and stays very active. Lab Results  Component Value Date   CHOL 186 10/27/2022   HDL 48 10/27/2022   LDLCALC 110 (H) 10/27/2022   TRIG 161 (H) 10/27/2022   CHOLHDL 3.9 10/27/2022     3. Gastroesophageal reflux disease without esophagitis Only uses OTC meds as needed  4. RLS (restless legs syndrome) Is on mirapex and that works well for her.  5. GAD (generalized anxiety disorder) Is on lexapro daily    05/17/2023   12:13 PM 10/27/2022   10:07 AM 04/27/2022   10:06 AM 10/30/2021    9:47 AM  GAD 7 : Generalized Anxiety Score  Nervous, Anxious, on Edge 0 0 0 0  Control/stop worrying 0 0 0 0  Worry too much - different things 0 0 0 0  Trouble relaxing 0 0 0 0  Restless 0 0 0 0  Easily annoyed or irritable 0 0 0 0  Afraid - awful might happen 0 0 0 0  Total GAD 7 Score 0 0 0 0  Anxiety Difficulty Not difficult at all Not difficult at all Not difficult at all Not difficult at all      6. Infiltrating ductal carcinoma of left breast (HCC) Has completed all treatments. Is on arimidex and is doing well. Has yearly follow up with . Due for repat mammogram next month  and follow up with oncologist December 1.  7. BMI 29.0-29.9,adult No recent weight changes Wt Readings from Last 3 Encounters:   05/17/23 172 lb (78 kg)  02/15/23 172 lb (78 kg)  10/27/22 176 lb (79.8 kg)   BMI Readings from Last 3 Encounters:  05/17/23 28.62 kg/m  02/15/23 28.62 kg/m  10/27/22 29.29 kg/m      New complaints: None today  No Known Allergies Outpatient Encounter Medications as of 05/17/2023  Medication Sig   anastrozole (ARIMIDEX) 1 MG tablet Take 1 mg by mouth daily.   aspirin EC 81 MG tablet Take 81 mg by mouth daily.   CALCIUM PO Take by mouth.   escitalopram (LEXAPRO) 10 MG tablet TAKE 1 TABLET EVERY DAY   levocetirizine (XYZAL) 5 MG tablet Take 5 mg by mouth every evening.   metoprolol succinate (TOPROL-XL) 25 MG 24 hr tablet TAKE 1 TABLET EVERY DAY   pramipexole (MIRAPEX) 0.25 MG tablet TAKE 1 TABLET AT BEDTIME   rosuvastatin (CRESTOR) 10 MG tablet Take 1 tablet (10 mg total) by mouth daily.   VITAMIN D PO Take by mouth.   cetirizine (ZYRTEC) 10 MG tablet Take 1 tablet (10 mg total) by mouth daily. (Patient not taking: Reported on 05/17/2023)   [DISCONTINUED] ibuprofen (ADVIL) 200 MG tablet Take 400 mg by mouth every other day.   No facility-administered encounter medications on  file as of 05/17/2023.    Past Surgical History:  Procedure Laterality Date   ABDOMINAL HYSTERECTOMY     BREAST BIOPSY Bilateral 10/24/2020   Fibroadenoma bilaterally   cyst removed from left arm pit  03/15/2017   FOOT SURGERY     TMJ ARTHROPLASTY      Family History  Problem Relation Age of Onset   Hypertension Mother    Kidney disease Mother    Dementia Mother    Heart disease Mother    Breast cancer Mother    Prostate cancer Father    Kidney disease Sister    Parkinson's disease Sister    Diabetes Sister    Heart disease Sister    Dementia Sister    Prostate cancer Brother    Heart disease Paternal Grandfather    Stomach cancer Paternal Uncle    Colon cancer Neg Hx       Controlled substance contract: n/a     Review of Systems  Constitutional:  Negative for diaphoresis.   Eyes:  Negative for pain.  Respiratory:  Negative for shortness of breath.   Cardiovascular:  Negative for chest pain, palpitations and leg swelling.  Gastrointestinal:  Negative for abdominal pain.  Endocrine: Negative for polydipsia.  Skin:  Negative for rash.  Neurological:  Negative for dizziness, weakness and headaches.  Hematological:  Does not bruise/bleed easily.  All other systems reviewed and are negative.      Objective:   Physical Exam Vitals and nursing note reviewed.  Constitutional:      General: She is not in acute distress.    Appearance: Normal appearance. She is well-developed.  HENT:     Head: Normocephalic.     Right Park: Tympanic membrane normal.     Left Park: Tympanic membrane normal.     Nose: Nose normal.     Mouth/Throat:     Mouth: Mucous membranes are moist.  Eyes:     Pupils: Pupils are equal, round, and reactive to light.  Neck:     Vascular: No carotid bruit or JVD.  Cardiovascular:     Rate and Rhythm: Normal rate and regular rhythm.     Heart sounds: Normal heart sounds.  Pulmonary:     Effort: Pulmonary effort is normal. No respiratory distress.     Breath sounds: Normal breath sounds. No wheezing or rales.  Chest:     Chest wall: No tenderness.  Abdominal:     General: Bowel sounds are normal. There is no distension or abdominal bruit.     Palpations: Abdomen is soft. There is no hepatomegaly, splenomegaly, mass or pulsatile mass.     Tenderness: There is no abdominal tenderness.  Musculoskeletal:        General: Normal range of motion.     Cervical back: Normal range of motion and neck supple.  Lymphadenopathy:     Cervical: No cervical adenopathy.  Skin:    General: Skin is warm and dry.  Neurological:     Mental Status: She is alert and oriented to person, place, and time.     Deep Tendon Reflexes: Reflexes are normal and symmetric.  Psychiatric:        Behavior: Behavior normal.        Thought Content: Thought content  normal.        Judgment: Judgment normal.    BP 132/82 (Patient Position: Sitting, Cuff Size: Normal)   Pulse (!) 51   Temp (!) 96.8 F (36 C) (Temporal)  Resp 20   Ht 5\' 5"  (1.651 m)   Wt 172 lb (78 kg)   SpO2 94%   BMI 28.62 kg/m          Assessment & Plan:   Amanda Park comes in today with chief complaint of Medical Management of Chronic Issues (Discuss colonoscopy appt)   Diagnosis and orders addressed:  1. Essential hypertension, benign Low sodium diet - metoprolol succinate (TOPROL-XL) 25 MG 24 hr tablet; Take 1 tablet (25 mg total) by mouth daily.  Dispense: 90 tablet; Refill: 1 - CBC with Differential/Platelet - CMP14+EGFR  2. Hyperlipidemia with target LDL less than 100 Low fat diet - rosuvastatin (CRESTOR) 10 MG tablet; Take 1 tablet (10 mg total) by mouth daily.  Dispense: 90 tablet; Refill: 1 - Lipid panel  3. Gastroesophageal reflux disease without esophagitis Avoid spicy foods Do not eat 2 hours prior to bedtime   4. RLS (restless legs syndrome) Keep legs warm at night - pramipexole (MIRAPEX) 0.25 MG tablet; Take 1 tablet (0.25 mg total) by mouth at bedtime.  Dispense: 90 tablet; Refill: 1  5. GAD (generalized anxiety disorder) Stress management - escitalopram (LEXAPRO) 10 MG tablet; Take 1 tablet (10 mg total) by mouth daily.  Dispense: 90 tablet; Refill: 1  6. Infiltrating ductal carcinoma of left breast [facility]) Keep oncology appointment  7. BMI 29.0-29.9,adult Discussed diet and exercise for person with BMI >25 Will recheck weight in 3-6 months    Labs pending Health Maintenance reviewed Diet and exercise encouraged  Follow up plan: 6 months   Mary-Margaret Daphine Deutscher, FNP

## 2023-05-17 NOTE — Patient Instructions (Signed)
Restless Legs Syndrome Restless legs syndrome is a condition that causes uncomfortable feelings or sensations in the legs, especially while sitting or lying down. The sensations usually cause an overwhelming urge to move the legs. The arms can also sometimes be affected. The condition can range from mild to severe. The symptoms often interfere with a person's ability to sleep. What are the causes? The cause of this condition is not known. What increases the risk? The following factors may make you more likely to develop this condition: Being older than 50. Pregnancy. Being a woman. In general, the condition is more common in women than in men. A family history of the condition. Having iron deficiency. Overuse of caffeine, nicotine, or alcohol. Certain medical conditions, such as kidney disease, Parkinson's disease, or nerve damage. Certain medicines, such as those for high blood pressure, nausea, colds, allergies, depression, and some heart conditions. What are the signs or symptoms? The main symptom of this condition is uncomfortable sensations in the legs, such as: Pulling. Tingling. Prickling. Throbbing. Crawling. Burning. Usually, the sensations: Affect both sides of the body. Are worse when you sit or lie down. Are worse at night. These may make it difficult to fall asleep. Make you have a strong urge to move your legs. Are temporarily relieved by moving your legs or standing. The arms can also be affected, but this is rare. People who have this condition often have tiredness during the day because of their lack of sleep at night. How is this diagnosed? This condition may be diagnosed based on: Your symptoms. Blood tests. In some cases, you may be monitored in a sleep lab by a specialist (a sleep study). This can detect any disruptions in your sleep. How is this treated? This condition is treated by managing the symptoms. This may include: Lifestyle changes, such as  exercising, using relaxation techniques, and avoiding caffeine, alcohol, or tobacco. Iron supplements. Medicines. Parkinson's medications may be tried first. Anti-seizure medications can also be helpful. Follow these instructions at home: General instructions Take over-the-counter and prescription medicines only as told by your health care provider. Use methods to help relieve the uncomfortable sensations, such as: Massaging your legs. Walking or stretching. Taking a cold or hot bath. Keep all follow-up visits. This is important. Lifestyle     Practice good sleep habits. For example, go to bed and get up at the same time every day. Most adults should get 7-9 hours of sleep each night. Exercise regularly. Try to get at least 30 minutes of exercise most days of the week. Practice ways of relaxing, such as yoga or meditation. Avoid caffeine and alcohol. Do not use any products that contain nicotine or tobacco. These products include cigarettes, chewing tobacco, and vaping devices, such as e-cigarettes. If you need help quitting, ask your health care provider. Where to find more information National Institute of Neurological Disorders and Stroke: www.ninds.nih.gov Contact a health care provider if: Your symptoms get worse or they do not improve with treatment. Summary Restless legs syndrome is a condition that causes uncomfortable feelings or sensations in the legs, especially while sitting or lying down. The symptoms often interfere with your ability to sleep. This condition is treated by managing the symptoms. You may need to make lifestyle changes or take medicines. This information is not intended to replace advice given to you by your health care provider. Make sure you discuss any questions you have with your health care provider. Document Revised: 02/23/2021 Document Reviewed: 02/23/2021 Elsevier Patient Education    2024 Elsevier Inc.  

## 2023-05-18 LAB — CBC WITH DIFFERENTIAL/PLATELET
Basophils Absolute: 0 10*3/uL (ref 0.0–0.2)
Basos: 1 %
EOS (ABSOLUTE): 0.1 10*3/uL (ref 0.0–0.4)
Eos: 2 %
Hematocrit: 39.6 % (ref 34.0–46.6)
Hemoglobin: 12.7 g/dL (ref 11.1–15.9)
Immature Grans (Abs): 0 10*3/uL (ref 0.0–0.1)
Immature Granulocytes: 0 %
Lymphocytes Absolute: 1.8 10*3/uL (ref 0.7–3.1)
Lymphs: 31 %
MCH: 28.4 pg (ref 26.6–33.0)
MCHC: 32.1 g/dL (ref 31.5–35.7)
MCV: 89 fL (ref 79–97)
Monocytes Absolute: 0.3 10*3/uL (ref 0.1–0.9)
Monocytes: 6 %
Neutrophils Absolute: 3.6 10*3/uL (ref 1.4–7.0)
Neutrophils: 60 %
Platelets: 184 10*3/uL (ref 150–450)
RBC: 4.47 x10E6/uL (ref 3.77–5.28)
RDW: 13 % (ref 11.7–15.4)
WBC: 5.9 10*3/uL (ref 3.4–10.8)

## 2023-05-18 LAB — CMP14+EGFR
ALT: 9 [IU]/L (ref 0–32)
AST: 13 [IU]/L (ref 0–40)
Albumin: 4.2 g/dL (ref 3.8–4.8)
Alkaline Phosphatase: 58 [IU]/L (ref 44–121)
BUN/Creatinine Ratio: 16 (ref 12–28)
BUN: 17 mg/dL (ref 8–27)
Bilirubin Total: 0.4 mg/dL (ref 0.0–1.2)
CO2: 22 mmol/L (ref 20–29)
Calcium: 9.3 mg/dL (ref 8.7–10.3)
Chloride: 107 mmol/L — ABNORMAL HIGH (ref 96–106)
Creatinine, Ser: 1.07 mg/dL — ABNORMAL HIGH (ref 0.57–1.00)
Globulin, Total: 2.2 g/dL (ref 1.5–4.5)
Glucose: 87 mg/dL (ref 70–99)
Potassium: 4.2 mmol/L (ref 3.5–5.2)
Sodium: 142 mmol/L (ref 134–144)
Total Protein: 6.4 g/dL (ref 6.0–8.5)
eGFR: 53 mL/min/{1.73_m2} — ABNORMAL LOW (ref >59)

## 2023-05-18 LAB — LIPID PANEL
Chol/HDL Ratio: 3.2 ratio (ref 0.0–4.4)
Cholesterol, Total: 153 mg/dL (ref 100–199)
HDL: 48 mg/dL (ref >39)
LDL Chol Calc (NIH): 84 mg/dL (ref 0–99)
Triglycerides: 119 mg/dL (ref 0–149)
VLDL Cholesterol Cal: 21 mg/dL (ref 5–40)

## 2023-06-09 DIAGNOSIS — C50412 Malignant neoplasm of upper-outer quadrant of left female breast: Secondary | ICD-10-CM | POA: Diagnosis not present

## 2023-06-09 DIAGNOSIS — R928 Other abnormal and inconclusive findings on diagnostic imaging of breast: Secondary | ICD-10-CM | POA: Diagnosis not present

## 2023-06-09 DIAGNOSIS — Z9012 Acquired absence of left breast and nipple: Secondary | ICD-10-CM | POA: Diagnosis not present

## 2023-06-09 DIAGNOSIS — R92333 Mammographic heterogeneous density, bilateral breasts: Secondary | ICD-10-CM | POA: Diagnosis not present

## 2023-06-15 DIAGNOSIS — H1045 Other chronic allergic conjunctivitis: Secondary | ICD-10-CM | POA: Diagnosis not present

## 2023-06-15 DIAGNOSIS — H524 Presbyopia: Secondary | ICD-10-CM | POA: Diagnosis not present

## 2023-06-15 DIAGNOSIS — H5213 Myopia, bilateral: Secondary | ICD-10-CM | POA: Diagnosis not present

## 2023-06-15 DIAGNOSIS — H43811 Vitreous degeneration, right eye: Secondary | ICD-10-CM | POA: Diagnosis not present

## 2023-06-15 DIAGNOSIS — H2513 Age-related nuclear cataract, bilateral: Secondary | ICD-10-CM | POA: Diagnosis not present

## 2023-06-17 DIAGNOSIS — H524 Presbyopia: Secondary | ICD-10-CM | POA: Diagnosis not present

## 2023-06-21 DIAGNOSIS — M85851 Other specified disorders of bone density and structure, right thigh: Secondary | ICD-10-CM | POA: Diagnosis not present

## 2023-06-21 DIAGNOSIS — M8589 Other specified disorders of bone density and structure, multiple sites: Secondary | ICD-10-CM | POA: Diagnosis not present

## 2023-06-25 DIAGNOSIS — E785 Hyperlipidemia, unspecified: Secondary | ICD-10-CM | POA: Diagnosis not present

## 2023-06-25 DIAGNOSIS — Z1152 Encounter for screening for COVID-19: Secondary | ICD-10-CM | POA: Diagnosis not present

## 2023-06-25 DIAGNOSIS — R059 Cough, unspecified: Secondary | ICD-10-CM | POA: Diagnosis not present

## 2023-06-25 DIAGNOSIS — R490 Dysphonia: Secondary | ICD-10-CM | POA: Diagnosis not present

## 2023-06-25 DIAGNOSIS — Z853 Personal history of malignant neoplasm of breast: Secondary | ICD-10-CM | POA: Diagnosis not present

## 2023-06-25 DIAGNOSIS — I10 Essential (primary) hypertension: Secondary | ICD-10-CM | POA: Diagnosis not present

## 2023-06-25 DIAGNOSIS — R0989 Other specified symptoms and signs involving the circulatory and respiratory systems: Secondary | ICD-10-CM | POA: Diagnosis not present

## 2023-06-25 DIAGNOSIS — J40 Bronchitis, not specified as acute or chronic: Secondary | ICD-10-CM | POA: Diagnosis not present

## 2023-06-25 DIAGNOSIS — F32A Depression, unspecified: Secondary | ICD-10-CM | POA: Diagnosis not present

## 2023-06-28 DIAGNOSIS — Z79811 Long term (current) use of aromatase inhibitors: Secondary | ICD-10-CM | POA: Diagnosis not present

## 2023-06-28 DIAGNOSIS — C50912 Malignant neoplasm of unspecified site of left female breast: Secondary | ICD-10-CM | POA: Diagnosis not present

## 2023-06-28 DIAGNOSIS — M8580 Other specified disorders of bone density and structure, unspecified site: Secondary | ICD-10-CM | POA: Diagnosis not present

## 2023-06-28 DIAGNOSIS — Z780 Asymptomatic menopausal state: Secondary | ICD-10-CM | POA: Diagnosis not present

## 2023-06-28 DIAGNOSIS — Z5181 Encounter for therapeutic drug level monitoring: Secondary | ICD-10-CM | POA: Diagnosis not present

## 2023-07-17 ENCOUNTER — Other Ambulatory Visit: Payer: Self-pay | Admitting: Nurse Practitioner

## 2023-07-17 DIAGNOSIS — G2581 Restless legs syndrome: Secondary | ICD-10-CM

## 2023-09-21 ENCOUNTER — Encounter: Payer: Self-pay | Admitting: *Deleted

## 2023-10-12 DIAGNOSIS — C50919 Malignant neoplasm of unspecified site of unspecified female breast: Secondary | ICD-10-CM | POA: Diagnosis not present

## 2023-10-12 DIAGNOSIS — R110 Nausea: Secondary | ICD-10-CM | POA: Diagnosis not present

## 2023-10-12 DIAGNOSIS — E785 Hyperlipidemia, unspecified: Secondary | ICD-10-CM | POA: Diagnosis not present

## 2023-10-12 DIAGNOSIS — K529 Noninfective gastroenteritis and colitis, unspecified: Secondary | ICD-10-CM | POA: Diagnosis not present

## 2023-11-08 ENCOUNTER — Ambulatory Visit: Payer: Medicare HMO | Admitting: Nurse Practitioner

## 2023-11-15 ENCOUNTER — Ambulatory Visit

## 2023-11-15 ENCOUNTER — Encounter: Payer: Self-pay | Admitting: Nurse Practitioner

## 2023-11-15 ENCOUNTER — Ambulatory Visit: Payer: Medicare HMO | Admitting: Nurse Practitioner

## 2023-11-15 VITALS — BP 137/73 | HR 51 | Temp 97.2°F | Ht 65.0 in | Wt 171.0 lb

## 2023-11-15 DIAGNOSIS — G2581 Restless legs syndrome: Secondary | ICD-10-CM | POA: Diagnosis not present

## 2023-11-15 DIAGNOSIS — C50912 Malignant neoplasm of unspecified site of left female breast: Secondary | ICD-10-CM | POA: Diagnosis not present

## 2023-11-15 DIAGNOSIS — K219 Gastro-esophageal reflux disease without esophagitis: Secondary | ICD-10-CM | POA: Diagnosis not present

## 2023-11-15 DIAGNOSIS — F411 Generalized anxiety disorder: Secondary | ICD-10-CM

## 2023-11-15 DIAGNOSIS — Z780 Asymptomatic menopausal state: Secondary | ICD-10-CM

## 2023-11-15 DIAGNOSIS — Z0001 Encounter for general adult medical examination with abnormal findings: Secondary | ICD-10-CM | POA: Diagnosis not present

## 2023-11-15 DIAGNOSIS — Z0000 Encounter for general adult medical examination without abnormal findings: Secondary | ICD-10-CM

## 2023-11-15 DIAGNOSIS — E785 Hyperlipidemia, unspecified: Secondary | ICD-10-CM

## 2023-11-15 DIAGNOSIS — I10 Essential (primary) hypertension: Secondary | ICD-10-CM

## 2023-11-15 DIAGNOSIS — R6889 Other general symptoms and signs: Secondary | ICD-10-CM | POA: Diagnosis not present

## 2023-11-15 DIAGNOSIS — Z6829 Body mass index (BMI) 29.0-29.9, adult: Secondary | ICD-10-CM | POA: Diagnosis not present

## 2023-11-15 LAB — LIPID PANEL

## 2023-11-15 MED ORDER — ROPINIROLE HCL 3 MG PO TABS: 3.0000 mg | ORAL_TABLET | Freq: Every day | ORAL | 1 refills | Status: DC

## 2023-11-15 MED ORDER — METOPROLOL SUCCINATE ER 25 MG PO TB24: 25.0000 mg | ORAL_TABLET | Freq: Every day | ORAL | 1 refills | Status: DC

## 2023-11-15 MED ORDER — ROSUVASTATIN CALCIUM 10 MG PO TABS: 10.0000 mg | ORAL_TABLET | Freq: Every day | ORAL | 1 refills | Status: DC

## 2023-11-15 MED ORDER — ESCITALOPRAM OXALATE 10 MG PO TABS: 10.0000 mg | ORAL_TABLET | Freq: Every day | ORAL | 1 refills | Status: DC

## 2023-11-15 NOTE — Addendum Note (Signed)
 Addended by: Delfina Feller on: 11/15/2023 10:50 AM   Modules accepted: Orders

## 2023-11-15 NOTE — Progress Notes (Signed)
 Subjective:    Patient ID: Amanda Park, female    DOB: 1945-11-07, 78 y.o.   MRN: [id number]   Chief Complaint: annual physical   HPI:  Amanda Park is a 78 y.o. who identifies as a female who was assigned female at birth.   Social history: Lives with: husband Work history: retired   Water engineer in today for follow up of the following chronic medical issues:  1. Essential hypertension, benign No c/o chest pain, sob or headache. Does not check blood pressure at home. BP Readings from Last 3 Encounters:  05/17/23 132/82  10/27/22 135/68  04/27/22 131/66     2. Hyperlipidemia with target LDL less than 100 Does try to watch diet and stays very active. Lab Results  Component Value Date   CHOL 153 05/17/2023   HDL 48 05/17/2023   LDLCALC 84 05/17/2023   TRIG 119 05/17/2023   CHOLHDL 3.2 05/17/2023     3. Gastroesophageal reflux disease without esophagitis Only uses OTC meds as needed  4. RLS (restless legs syndrome) Is on mirapex  and that works well for her.  5. GAD (generalized anxiety disorder) Is on lexapro  daily    05/17/2023   12:13 PM 10/27/2022   10:07 AM 04/27/2022   10:06 AM 10/30/2021    9:47 AM  GAD 7 : Generalized Anxiety Score  Nervous, Anxious, on Edge 0 0 0 0  Control/stop worrying 0 0 0 0  Worry too much - different things 0 0 0 0  Trouble relaxing 0 0 0 0  Restless 0 0 0 0  Easily annoyed or irritable 0 0 0 0  Afraid - awful might happen 0 0 0 0  Total GAD 7 Score 0 0 0 0  Anxiety Difficulty Not difficult at all Not difficult at all Not difficult at all Not difficult at all      6. Infiltrating ductal carcinoma of left breast (HCC) Has completed all treatments. Is on arimidex and is doing well. Has yearly follow up with . Due for repat mammogram next month  and follow up with oncologist December 1.  7. BMI 29.0-29.9,adult No recent weight changes Wt Readings from Last 3 Encounters:  05/17/23 172 lb (78 kg)  02/15/23 172 lb (78 kg)   10/27/22 176 lb (79.8 kg)   BMI Readings from Last 3 Encounters:  05/17/23 28.62 kg/m  02/15/23 28.62 kg/m  10/27/22 29.29 kg/m      New complaints: - dizziness- 4-5 day a week. Usually has  to go to bed for it to resolve.   No Known Allergies Outpatient Encounter Medications as of 11/15/2023  Medication Sig   anastrozole (ARIMIDEX) 1 MG tablet Take 1 mg by mouth daily.   aspirin EC 81 MG tablet Take 81 mg by mouth daily.   CALCIUM  PO Take by mouth.   cetirizine  (ZYRTEC ) 10 MG tablet Take 1 tablet (10 mg total) by mouth daily. (Patient not taking: Reported on 05/17/2023)   escitalopram  (LEXAPRO ) 10 MG tablet Take 1 tablet (10 mg total) by mouth daily.   levocetirizine (XYZAL) 5 MG tablet Take 5 mg by mouth every evening.   metoprolol  succinate (TOPROL -XL) 25 MG 24 hr tablet Take 1 tablet (25 mg total) by mouth daily.   pramipexole  (MIRAPEX ) 0.25 MG tablet TAKE 1 TABLET AT BEDTIME   rosuvastatin  (CRESTOR ) 10 MG tablet Take 1 tablet (10 mg total) by mouth daily.   VITAMIN D  PO Take by mouth.   No facility-administered encounter medications on file  as of 11/15/2023.    Past Surgical History:  Procedure Laterality Date   ABDOMINAL HYSTERECTOMY     BREAST BIOPSY Bilateral 10/24/2020   Fibroadenoma bilaterally   cyst removed from left arm pit  03/15/2017   FOOT SURGERY     TMJ ARTHROPLASTY      Family History  Problem Relation Age of Onset   Hypertension Mother    Kidney disease Mother    Dementia Mother    Heart disease Mother    Breast cancer Mother    Prostate cancer Father    Kidney disease Sister    Parkinson's disease Sister    Diabetes Sister    Heart disease Sister    Dementia Sister    Prostate cancer Brother    Heart disease Paternal Grandfather    Stomach cancer Paternal Uncle    Colon cancer Neg Hx       Controlled substance contract: n/a     Review of Systems  Constitutional:  Negative for diaphoresis.  Eyes:  Negative for pain.   Respiratory:  Negative for shortness of breath.   Cardiovascular:  Negative for chest pain, palpitations and leg swelling.  Gastrointestinal:  Negative for abdominal pain.  Endocrine: Negative for polydipsia.  Skin:  Negative for rash.  Neurological:  Negative for dizziness, weakness and headaches.  Hematological:  Does not bruise/bleed easily.  All other systems reviewed and are negative.      Objective:   Physical Exam Vitals and nursing note reviewed.  Constitutional:      General: She is not in acute distress.    Appearance: Normal appearance. She is well-developed.  HENT:     Head: Normocephalic.     Right Ear: Tympanic membrane normal.     Left Ear: Tympanic membrane normal.     Nose: Nose normal.     Mouth/Throat:     Mouth: Mucous membranes are moist.  Eyes:     Pupils: Pupils are equal, round, and reactive to light.  Neck:     Vascular: No carotid bruit or JVD.  Cardiovascular:     Rate and Rhythm: Normal rate and regular rhythm.     Heart sounds: Normal heart sounds.  Pulmonary:     Effort: Pulmonary effort is normal. No respiratory distress.     Breath sounds: Normal breath sounds. No wheezing or rales.  Chest:     Chest wall: No tenderness.  Abdominal:     General: Bowel sounds are normal. There is no distension or abdominal bruit.     Palpations: Abdomen is soft. There is no hepatomegaly, splenomegaly, mass or pulsatile mass.     Tenderness: There is no abdominal tenderness.  Musculoskeletal:        General: Normal range of motion.     Cervical back: Normal range of motion and neck supple.  Lymphadenopathy:     Cervical: No cervical adenopathy.  Skin:    General: Skin is warm and dry.  Neurological:     Mental Status: She is alert and oriented to person, place, and time.     Deep Tendon Reflexes: Reflexes are normal and symmetric.  Psychiatric:        Behavior: Behavior normal.        Thought Content: Thought content normal.        Judgment:  Judgment normal.    BP 137/73   Pulse (!) 51   Temp (!) 97.2 F (36.2 C) (Temporal)   Ht 5\' 5"  (1.651 m)  Wt 171 lb (77.6 kg)   SpO2 97%   BMI 28.46 kg/m          Assessment & Plan:   Amanda Park comes in today with chief complaint of annual physcial  Diagnosis and orders addressed:  1. Essential hypertension, benign Low sodium diet - metoprolol  succinate (TOPROL -XL) 25 MG 24 hr tablet; Take 1 tablet (25 mg total) by mouth daily.  Dispense: 90 tablet; Refill: 1 - CBC with Differential/Platelet - CMP14+EGFR  2. Hyperlipidemia with target LDL less than 100 Low fat diet - rosuvastatin  (CRESTOR ) 10 MG tablet; Take 1 tablet (10 mg total) by mouth daily.  Dispense: 90 tablet; Refill: 1 - Lipid panel  3. Gastroesophageal reflux disease without esophagitis Avoid spicy foods Do not eat 2 hours prior to bedtime   4. RLS (restless legs syndrome) Keep legs warm at night - requip  3mg  tablet; Take 1 tablet (0.25 mg total) by mouth at bedtime.  Dispense: 90 tablet; Refill: 1  5. GAD (generalized anxiety disorder) Stress management - escitalopram  (LEXAPRO ) 10 MG tablet; Take 1 tablet (10 mg total) by mouth daily.  Dispense: 90 tablet; Refill: 1  6. Infiltrating ductal carcinoma of left breast [facility]) Keep oncology appointment  7. BMI 29.0-29.9,adult Discussed diet and exercise for person with BMI >25 Will recheck weight in 3-6 months  8. Postmenopausal  Dexascan done today  9. Vertigo Bonine OTC as needed Force fluids Rise slowly from sitting to standing  Labs pending Health Maintenance reviewed Referral to gi for colonoscopy Mammogram is scheduled Diet and exercise encouraged  Follow up plan: 6 months   Mary-Margaret Gaylyn Keas, FNP

## 2023-11-15 NOTE — Patient Instructions (Signed)
 Vertigo Vertigo is the feeling that you or the things around you are moving or spinning when they're not. It's different than feeling dizzy. It can also cause: Loss of balance. Trouble standing or walking. Nausea and vomiting. This feeling can come and go at any time. It can last from a few seconds to minutes or even hours. It may go away on its own or be treated with medicine. What are the types of vertigo? There are two types of vertigo: Peripheral vertigo happens when parts of your inner ear don't work like they should. This is the more common type. Central vertigo happens when your brain and spinal cord don't work like they should. Your health care provider will do tests to find out what kind of vertigo you have. This will help them decide on the right treatment for you. Follow these instructions at home: Eating and drinking Drink enough fluid to keep your pee (urine) pale yellow. Do not drink alcohol. Activity When you get up in the morning, first sit up on the side of the bed. When you feel okay, stand slowly while holding onto something. Move slowly. Avoid sudden body or head movements. Avoid certain positions, as told by your provider. Use a cane if you have trouble standing or walking. Sit down right away if you feel unsteady. Place items in your home so they're easy for you to reach without bending or leaning over. Return to normal activities when you're told. Ask what things are safe for you to do. General instructions Take your medicines only as told by your provider. Contact a health care provider if: Your medicines don't help or make your vertigo worse. You get new symptoms. You have a fever. You have nausea or vomiting. Your family or friends spot any changes in how you're acting. A part of your body goes numb. You feel tingling and prickling in a part of your body. You get very bad headaches. Get help right away if: You're always dizzy or you faint. You have a  stiff neck. You have trouble moving or speaking. Your hands, arms, or legs feel weak. Your hearing or eyesight changes. These symptoms may be an emergency. Call 911 right away. Do not wait to see if the symptoms will go away. Do not drive yourself to the hospital. This information is not intended to replace advice given to you by your health care provider. Make sure you discuss any questions you have with your health care provider. Document Revised: 04/15/2023 Document Reviewed: 10/16/2022 Elsevier Patient Education  2024 [employer].

## 2023-11-16 LAB — CBC WITH DIFFERENTIAL/PLATELET
Basophils Absolute: 0 10*3/uL (ref 0.0–0.2)
Basos: 1 %
EOS (ABSOLUTE): 0.1 10*3/uL (ref 0.0–0.4)
Eos: 2 %
Hematocrit: 41.9 % (ref 34.0–46.6)
Hemoglobin: 13.9 g/dL (ref 11.1–15.9)
Immature Grans (Abs): 0 10*3/uL (ref 0.0–0.1)
Immature Granulocytes: 0 %
Lymphocytes Absolute: 1.8 10*3/uL (ref 0.7–3.1)
Lymphs: 35 %
MCH: 29.6 pg (ref 26.6–33.0)
MCHC: 33.2 g/dL (ref 31.5–35.7)
MCV: 89 fL (ref 79–97)
Monocytes Absolute: 0.3 10*3/uL (ref 0.1–0.9)
Monocytes: 6 %
Neutrophils Absolute: 2.9 10*3/uL (ref 1.4–7.0)
Neutrophils: 56 %
Platelets: 182 10*3/uL (ref 150–450)
RBC: 4.69 x10E6/uL (ref 3.77–5.28)
RDW: 13.5 % (ref 11.7–15.4)
WBC: 5.1 10*3/uL (ref 3.4–10.8)

## 2023-11-16 LAB — CMP14+EGFR
ALT: 19 IU/L (ref 0–32)
AST: 22 IU/L (ref 0–40)
Albumin: 4.4 g/dL (ref 3.8–4.8)
Alkaline Phosphatase: 63 IU/L (ref 44–121)
BUN/Creatinine Ratio: 17 (ref 12–28)
BUN: 18 mg/dL (ref 8–27)
Bilirubin Total: 0.4 mg/dL (ref 0.0–1.2)
CO2: 20 mmol/L (ref 20–29)
Calcium: 9.7 mg/dL (ref 8.7–10.3)
Chloride: 105 mmol/L (ref 96–106)
Creatinine, Ser: 1.06 mg/dL — ABNORMAL HIGH (ref 0.57–1.00)
Globulin, Total: 2.1 g/dL (ref 1.5–4.5)
Glucose: 91 mg/dL (ref 70–99)
Potassium: 4.4 mmol/L (ref 3.5–5.2)
Sodium: 140 mmol/L (ref 134–144)
Total Protein: 6.5 g/dL (ref 6.0–8.5)
eGFR: 54 mL/min/{1.73_m2} — ABNORMAL LOW (ref >59)

## 2023-11-16 LAB — THYROID PANEL WITH TSH
Free Thyroxine Index: 2.5 (ref 1.2–4.9)
T3 Uptake Ratio: 27 % (ref 24–39)
T4, Total: 9.1 ug/dL (ref 4.5–12.0)
TSH: 1.94 u[IU]/mL (ref 0.450–4.500)

## 2023-11-16 LAB — LIPID PANEL
Cholesterol, Total: 153 mg/dL (ref 100–199)
HDL: 48 mg/dL (ref >39)
LDL CALC COMMENT:: 3.2 ratio (ref 0.0–4.4)
LDL Chol Calc (NIH): 81 mg/dL (ref 0–99)
Triglycerides: 135 mg/dL (ref 0–149)
VLDL Cholesterol Cal: 24 mg/dL (ref 5–40)

## 2023-11-16 LAB — VITAMIN D 25 HYDROXY (VIT D DEFICIENCY, FRACTURES): Vit D, 25-Hydroxy: 69.5 ng/mL (ref 30.0–100.0)

## 2023-11-26 ENCOUNTER — Encounter: Payer: Self-pay | Admitting: Physician Assistant

## 2023-11-29 ENCOUNTER — Encounter: Payer: Self-pay | Admitting: Nurse Practitioner

## 2023-12-07 DIAGNOSIS — R92332 Mammographic heterogeneous density, left breast: Secondary | ICD-10-CM | POA: Diagnosis not present

## 2023-12-07 DIAGNOSIS — C50412 Malignant neoplasm of upper-outer quadrant of left female breast: Secondary | ICD-10-CM | POA: Diagnosis not present

## 2023-12-07 DIAGNOSIS — C50912 Malignant neoplasm of unspecified site of left female breast: Secondary | ICD-10-CM | POA: Diagnosis not present

## 2023-12-07 DIAGNOSIS — Z9012 Acquired absence of left breast and nipple: Secondary | ICD-10-CM | POA: Diagnosis not present

## 2023-12-27 DIAGNOSIS — Z5181 Encounter for therapeutic drug level monitoring: Secondary | ICD-10-CM | POA: Diagnosis not present

## 2023-12-27 DIAGNOSIS — Z79811 Long term (current) use of aromatase inhibitors: Secondary | ICD-10-CM | POA: Diagnosis not present

## 2023-12-27 DIAGNOSIS — C50912 Malignant neoplasm of unspecified site of left female breast: Secondary | ICD-10-CM | POA: Diagnosis not present

## 2023-12-27 DIAGNOSIS — Z780 Asymptomatic menopausal state: Secondary | ICD-10-CM | POA: Diagnosis not present

## 2024-01-18 ENCOUNTER — Ambulatory Visit: Admitting: Physician Assistant

## 2024-01-20 NOTE — Progress Notes (Signed)
 Ellouise Console, PA-C [address] [place], [place]  [address] Phone: [phone]   Gastroenterology Consultation  Referring Provider:     Gladis Mustard, * Primary Care Physician:  Gladis Mustard, FNP Primary Gastroenterologist:  Ellouise Console, PA-C / Glendia Holt, MD  Reason for Consultation:     Repeat colonoscopy; Diarrhea        HPI:   Amanda Park is a 78 y.o. y/o female referred for consultation & management  by Gladis Mustard, FNP.    Previous patient of Dr. Teressa.  Presents to discuss repeat colonoscopy.  10/2015 last screening colonoscopy by Dr. Teressa: 1 small 4 mm tubular adenoma polyp removed from transverse colon.  Otherwise normal.  Excellent prep.  7-year repeat was recommended (due 10/2022).  Patient states she is having daily chronic diarrhea for 2 years.  She had breast cancer and left mastectomy in 2023.  Was started on Arimidex and has had diarrhea ever since.  She has at least 4-6 watery loose stools daily.  Mostly in the morning.  She denies abdominal pain or weight loss.  Admits to mild weight gain.  Denies heartburn or dysphagia.  Occasionally sees mild bright red blood on the tissue after bowel movements.  Admits to increased gas and flatulence.  She has not had any stool studies or GI evaluation for diarrhea.  Past Medical History:  Diagnosis Date   Allergy    Breast cancer (HCC) 2023   Colon polyps 2018   Hyperlipidemia    Hypertension    Obesity    Osteopenia    PVC (premature ventricular contraction)     Past Surgical History:  Procedure Laterality Date   ABDOMINAL HYSTERECTOMY     APPENDECTOMY     BREAST BIOPSY Bilateral 10/24/2020   Fibroadenoma bilaterally   cyst removed from left arm pit  03/15/2017   FOOT SURGERY     TEMPOROMANDIBULAR JOINT SURGERY Bilateral    TMJ ARTHROPLASTY      Prior to Admission medications   Medication Sig Start Date End Date Taking? Authorizing Provider  anastrozole (ARIMIDEX)  1 MG tablet Take 1 mg by mouth daily. 01/14/22   [provider]  aspirin EC 81 MG tablet Take 81 mg by mouth daily.    [provider]  CALCIUM  PO Take by mouth.    [provider]  escitalopram  (LEXAPRO ) 10 MG tablet Take 1 tablet (10 mg total) by mouth daily. 11/15/23   Gladis Mary-Margaret, FNP  levocetirizine (XYZAL) 5 MG tablet Take 5 mg by mouth every evening.    [provider]  metoprolol  succinate (TOPROL -XL) 25 MG 24 hr tablet Take 1 tablet (25 mg total) by mouth daily. 11/15/23   Gladis, Mary-Margaret, FNP  rOPINIRole  (REQUIP ) 3 MG tablet Take 1 tablet (3 mg total) by mouth at bedtime. 11/15/23   Gladis Mary-Margaret, FNP  rosuvastatin  (CRESTOR ) 10 MG tablet Take 1 tablet (10 mg total) by mouth daily. 11/15/23   Gladis Mustard, FNP  VITAMIN D  PO Take by mouth.    [provider]    Family History  Problem Relation Age of Onset   Hypertension Mother    Kidney disease Mother    Dementia Mother    Heart disease Mother    Breast cancer Mother    Prostate cancer Father    Kidney disease Sister    Parkinson's disease Sister    Diabetes Sister    Heart disease Sister    Dementia Sister  Prostate cancer Brother    Heart disease Paternal Grandfather    Stomach cancer Paternal Uncle    Colon cancer Neg Hx      Social History   Tobacco Use   Smoking status: Never   Smokeless tobacco: Never  Vaping Use   Vaping status: Never Used  Substance Use Topics   Alcohol use: No    Alcohol/week: 0.0 standard drinks of alcohol   Drug use: No    Allergies as of 01/21/2024   (No Known Allergies)    Review of Systems:    All systems reviewed and negative except where noted in HPI.   Physical Exam:  BP 138/74 (BP Location: Left Arm, Patient Position: Sitting, Cuff Size: Normal)   Pulse 60   Ht 5' 5 (1.651 m)   Wt 170 lb 8 oz (77.3 kg)   BMI 28.37 kg/m  No LMP recorded. Patient has had a hysterectomy.  General:   Alert,   Well-developed, well-nourished, pleasant and cooperative in NAD Lungs:  Respirations even and unlabored.  Clear throughout to auscultation.   No wheezes, crackles, or rhonchi. No acute distress. Heart:  Regular rate and rhythm; no murmurs, clicks, rubs, or gallops. Abdomen:  Normal bowel sounds.  No bruits.  Soft, and non-distended without masses, hepatosplenomegaly or hernias noted.  No Tenderness.  No guarding or rebound tenderness.    Neurologic:  Alert and oriented x3;  grossly normal neurologically. Psych:  Alert and cooperative. Normal mood and affect.  Imaging Studies: No results found.  Labs: CBC    Component Value Date/Time   WBC 5.1 11/15/2023 1104   RBC 4.69 11/15/2023 1104   HGB 13.9 11/15/2023 1104   HCT 41.9 11/15/2023 1104   PLT 182 11/15/2023 1104   MCV 89 11/15/2023 1104   MCH 29.6 11/15/2023 1104   MCHC 33.2 11/15/2023 1104   RDW 13.5 11/15/2023 1104   LYMPHSABS 1.8 11/15/2023 1104   EOSABS 0.1 11/15/2023 1104   BASOSABS 0.0 11/15/2023 1104    CMP     Component Value Date/Time   NA 140 11/15/2023 1104   K 4.4 11/15/2023 1104   CL 105 11/15/2023 1104   CO2 20 11/15/2023 1104   GLUCOSE 91 11/15/2023 1104   BUN 18 11/15/2023 1104   CREATININE 1.06 (H) 11/15/2023 1104   CALCIUM  9.7 11/15/2023 1104   PROT 6.5 11/15/2023 1104   ALBUMIN 4.4 11/15/2023 1104   AST 22 11/15/2023 1104   ALT 19 11/15/2023 1104   ALKPHOS 63 11/15/2023 1104   BILITOT 0.4 11/15/2023 1104   GFRNONAA 60 04/29/2020 1111   GFRAA 69 04/29/2020 1111    Assessment and Plan:   Amanda Park is a 78 y.o. y/o female has been referred for:   Diarrhea - Chronic x 2 years since started on Arimidex for Breast Cancer.  Differential diagnosis includes adverse side effect of medication,  Infectious diarrhea, Microscopic Colitis. - Stool Studies: GI Pathogen Panal, C. Difficile Toxin PCR - Colonoscopy to Check for Microscopic Colitis  Blood in Stool - Mild BRB on Tissue; No Anemia (Hgb  13.9g) - Scheduling Colonoscopy  History of Colon Polyps - Scheduling Colonoscopy I discussed risks of colonoscopy with patient to include risk of bleeding, colon perforation, and risk of sedation.  Patient expressed understanding and agrees to proceed with colonoscopy.   Follow up 4 weeks after Colonoscopy with TG.  Ellouise Console, PA-C

## 2024-01-21 ENCOUNTER — Ambulatory Visit: Admitting: Physician Assistant

## 2024-01-21 ENCOUNTER — Encounter: Payer: Self-pay | Admitting: Physician Assistant

## 2024-01-21 ENCOUNTER — Other Ambulatory Visit

## 2024-01-21 VITALS — BP 138/74 | HR 60 | Ht 65.0 in | Wt 170.5 lb

## 2024-01-21 DIAGNOSIS — Z860100 Personal history of colon polyps, unspecified: Secondary | ICD-10-CM

## 2024-01-21 DIAGNOSIS — K921 Melena: Secondary | ICD-10-CM

## 2024-01-21 DIAGNOSIS — R197 Diarrhea, unspecified: Secondary | ICD-10-CM

## 2024-01-21 DIAGNOSIS — K529 Noninfective gastroenteritis and colitis, unspecified: Secondary | ICD-10-CM

## 2024-01-21 DIAGNOSIS — R195 Other fecal abnormalities: Secondary | ICD-10-CM | POA: Diagnosis not present

## 2024-01-21 MED ORDER — NA SULFATE-K SULFATE-MG SULF 17.5-3.13-1.6 GM/177ML PO SOLN: 1.0000 | Freq: Once | ORAL | 0 refills | Status: AC

## 2024-01-21 NOTE — Addendum Note (Signed)
 Addended by: LANETTE ALETHEA CROME on: 01/21/2024 02:00 PM   Modules accepted: Orders

## 2024-01-21 NOTE — Patient Instructions (Addendum)
 Your provider has requested that you go to the basement level for lab work before leaving today. Press B on the elevator. The lab is located at the first door on the left as you exit the elevator.  You have been scheduled for a Colonoscopy. Please follow written instructions given to you at your visit today.   If you use inhalers (even only as needed), please bring them with you on the day of your procedure.  DO NOT TAKE 7 DAYS PRIOR TO TEST- Trulicity (dulaglutide) Ozempic, Wegovy (semaglutide) Mounjaro (tirzepatide) Bydureon Bcise (exanatide extended release)  DO NOT TAKE 1 DAY PRIOR TO YOUR TEST Rybelsus (semaglutide) Adlyxin (lixisenatide) Victoza (liraglutide) Byetta (exanatide) ___________________________________________________________________________  Please follow up sooner if symptoms increase or worsen   Due to recent changes in healthcare laws, you may see the results of your imaging and laboratory studies on MyChart before your provider has had a chance to review them.  We understand that in some cases there may be results that are confusing or concerning to you. Not all laboratory results come back in the same time frame and the provider may be waiting for multiple results in order to interpret others.  Please give us  48 hours in order for your provider to thoroughly review all the results before contacting the office for clarification of your results.   Thank you for trusting me with your gastrointestinal care!   Ellouise Console, PA-C _______________________________________________________  If your blood pressure at your visit was 140/90 or greater, please contact your primary care physician to follow up on this.  _______________________________________________________  If you are age 55 or older, your body mass index should be between 23-30. Your Body mass index is 28.37 kg/m. If this is out of the aforementioned range listed, please consider follow up with your  Primary Care Provider.  If you are age 1 or younger, your body mass index should be between 19-25. Your Body mass index is 28.37 kg/m. If this is out of the aformentioned range listed, please consider follow up with your Primary Care Provider.   ________________________________________________________  The [facility] providers would like to encourage you to use MYCHART to communicate with providers for non-urgent requests or questions.  Due to long hold times on the telephone, sending your provider a message by [facility] may be a faster and more efficient way to get a response.  Please allow 48 business hours for a response.  Please remember that this is for non-urgent requests.  _______________________________________________________

## 2024-01-22 NOTE — Progress Notes (Signed)
 Agree with the assessment and plan as outlined by Brigitte Canard, PA-C.

## 2024-01-25 ENCOUNTER — Telehealth: Payer: Self-pay | Admitting: Gastroenterology

## 2024-01-25 ENCOUNTER — Other Ambulatory Visit

## 2024-01-25 DIAGNOSIS — R197 Diarrhea, unspecified: Secondary | ICD-10-CM

## 2024-01-25 DIAGNOSIS — K625 Hemorrhage of anus and rectum: Secondary | ICD-10-CM | POA: Diagnosis not present

## 2024-01-25 NOTE — Telephone Encounter (Signed)
 [employer] rep called on behalf of this patient requesting to that we send over a prior authorization to them before patient has her colonoscopy procedure on August the 8 th. Best contact number for them is [phone]. please advise.

## 2024-01-26 LAB — GI PROFILE, STOOL, PCR

## 2024-01-27 ENCOUNTER — Ambulatory Visit: Payer: Self-pay | Admitting: Physician Assistant

## 2024-01-27 LAB — CLOSTRIDIUM DIFFICILE BY PCR: Toxigenic C. Difficile by PCR: NEGATIVE

## 2024-02-17 ENCOUNTER — Ambulatory Visit: Payer: Medicare HMO

## 2024-02-17 VITALS — BP 138/74 | HR 60 | Ht 65.0 in | Wt 170.0 lb

## 2024-02-17 DIAGNOSIS — Z0000 Encounter for general adult medical examination without abnormal findings: Secondary | ICD-10-CM

## 2024-02-17 NOTE — Patient Instructions (Signed)
 Ms. Klecker , Thank you for taking time out of your busy schedule to complete your Annual Wellness Visit with me. I enjoyed our conversation and look forward to speaking with you again next year. I, as well as your care team,  appreciate your ongoing commitment to your health goals. Please review the following plan we discussed and let me know if I can assist you in the future. Your Game plan/ To Do List    Follow up Visits: Next Medicare AWV with our clinical staff: 02/18/26 at 10:40a.m.   Have you seen your provider in the last 6 months (3 months if uncontrolled diabetes)? No Next Office Visit with your provider: 05/08/24 at 9:45a.m.  Clinician Recommendations:  Aim for 30 minutes of exercise or brisk walking, 6-8 glasses of water, and 5 servings of fruits and vegetables each day.       This is a list of the screening recommended for you and due dates:  Health Maintenance  Topic Date Due   Medicare Annual Wellness Visit  02/15/2024   Mammogram  05/16/2024*   DEXA scan (bone density measurement)  05/16/2024*   Colon Cancer Screening  11/14/2024*   Flu Shot  02/25/2024   COVID-19 Vaccine (5 - Moderna risk 2024-25 season) 06/30/2024   DTaP/Tdap/Td vaccine (2 - Td or Tdap) 10/31/2031   Pneumococcal Vaccine for age over 1  Completed   Hepatitis C Screening  Completed   Zoster (Shingles) Vaccine  Completed   Hepatitis B Vaccine  Aged Out   HPV Vaccine  Aged Out   Meningitis B Vaccine  Aged Out  *Topic was postponed. The date shown is not the original due date.    Advanced directives: (Declined) Advance directive discussed with you today. Even though you declined this today, please call our office should you change your mind, and we can give you the proper paperwork for you to fill out. Advance Care Planning is important because it:  [x]  Makes sure you receive the medical care that is consistent with your values, goals, and preferences  [x]  It provides guidance to your family and loved  ones and reduces their decisional burden about whether or not they are making the right decisions based on your wishes.  Follow the link provided in your after visit summary or read over the paperwork we have mailed to you to help you started getting your Advance Directives in place. If you need assistance in completing these, please reach out to us  so that we can help you!  See attachments for Preventive Care and Fall Prevention Tips.

## 2024-02-17 NOTE — Progress Notes (Cosign Needed Addendum)
 Subjective:   Amanda Park is a 78 y.o. who presents for a Medicare Wellness preventive visit.  As a reminder, Annual Wellness Visits don't include a physical exam, and some assessments may be limited, especially if this visit is performed virtually. We may recommend an in-person follow-up visit with your provider if needed.  Visit Complete: Virtual I connected with  Braden Breen on 02/17/24 by a audio enabled telemedicine application and verified that I am speaking with the correct person using two identifiers.  Patient Location: Home  Provider Location: Home Office  I discussed the limitations of evaluation and management by telemedicine. The patient expressed understanding and agreed to proceed.  Vital Signs: Because this visit was a virtual/telehealth visit, some criteria may be missing or patient reported. Any vitals not documented were not able to be obtained and vitals that have been documented are patient reported.  VideoDeclined- This patient declined Librarian, academic. Therefore the visit was completed with audio only.  Persons Participating in Visit: Patient.  AWV Questionnaire: No: Patient Medicare AWV questionnaire was not completed prior to this visit.  Cardiac Risk Factors include: advanced age (>74men, >66 women);dyslipidemia;hypertension;smoking/ tobacco exposure     Objective:    Today's Vitals   02/17/24 1314  BP: 138/74  Pulse: 60  Weight: 170 lb (77.1 kg)  Height: 5' 5 (1.651 m)   Body mass index is 28.29 kg/m.     02/17/2024    1:19 PM 02/15/2023    9:52 AM 02/10/2022    9:53 AM 11/26/2020    2:10 PM 10/26/2019    1:46 PM 11/15/2015   10:37 AM 10/31/2015   10:06 AM  Advanced Directives  Does Patient Have a Medical Advance Directive? No Yes No No No No  No   Type of Furniture conservator/restorer;Living will       Copy of Healthcare Power of Attorney in Chart?  No - copy requested       Would patient like  information on creating a medical advance directive?   No - Patient declined Yes (MAU/Ambulatory/Procedural Areas - Information given) Yes (MAU/Ambulatory/Procedural Areas - Information given) No - patient declined information  No - patient declined information      Data saved with a previous flowsheet row definition    Current Medications (verified) Outpatient Encounter Medications as of 02/17/2024  Medication Sig   anastrozole (ARIMIDEX) 1 MG tablet Take 1 mg by mouth daily.   aspirin EC 81 MG tablet Take 81 mg by mouth daily.   CALCIUM  PO Take by mouth.   escitalopram  (LEXAPRO ) 10 MG tablet Take 1 tablet (10 mg total) by mouth daily.   levocetirizine (XYZAL) 5 MG tablet Take 5 mg by mouth every evening.   metoprolol  succinate (TOPROL -XL) 25 MG 24 hr tablet Take 1 tablet (25 mg total) by mouth daily.   rOPINIRole  (REQUIP ) 3 MG tablet Take 1 tablet (3 mg total) by mouth at bedtime.   rosuvastatin  (CRESTOR ) 10 MG tablet Take 1 tablet (10 mg total) by mouth daily.   VITAMIN D  PO Take by mouth.   No facility-administered encounter medications on file as of 02/17/2024.    Allergies (verified) Patient has no known allergies.   History: Past Medical History:  Diagnosis Date   Allergy    Breast cancer (HCC) 2023   Colon polyps 2018   Hyperlipidemia    Hypertension    Obesity    Osteopenia    PVC (premature ventricular contraction)  Past Surgical History:  Procedure Laterality Date   ABDOMINAL HYSTERECTOMY     APPENDECTOMY     BREAST BIOPSY Bilateral 10/24/2020   Fibroadenoma bilaterally   cyst removed from left arm pit  03/15/2017   FOOT SURGERY     TEMPOROMANDIBULAR JOINT SURGERY Bilateral    TMJ ARTHROPLASTY     Family History  Problem Relation Age of Onset   Hypertension Mother    Kidney disease Mother    Dementia Mother    Heart disease Mother    Breast cancer Mother    Prostate cancer Father    Kidney disease Sister    Parkinson's disease Sister    Diabetes  Sister    Heart disease Sister    Dementia Sister    Prostate cancer Brother    Heart disease Paternal Grandfather    Stomach cancer Paternal Uncle    Colon cancer Neg Hx    Social History   Socioeconomic History   Marital status: Married    Spouse name: Deward Boom   Number of children: 2   Years of education: 12   Highest education level: Some college, no degree  Occupational History   Occupation: Retired  Tobacco Use   Smoking status: Never   Smokeless tobacco: Never  Vaping Use   Vaping status: Never Used  Substance and Sexual Activity   Alcohol use: No    Alcohol/week: 0.0 standard drinks of alcohol   Drug use: No   Sexual activity: Not Currently  Other Topics Concern   Not on file  Social History Narrative   Married, lives with husband, very active in church, outdoors, gardening   Her washer and dryer and sewing machine are in the basement - she uses the stairs several times per day   Social Drivers of Health   Financial Resource Strain: Low Risk  (02/17/2024)   Overall Financial Resource Strain (CARDIA)    Difficulty of Paying Living Expenses: Not hard at all  Food Insecurity: No Food Insecurity (02/17/2024)   Hunger Vital Sign    Worried About Running Out of Food in the Last Year: Never true    Ran Out of Food in the Last Year: Never true  Transportation Needs: No Transportation Needs (02/17/2024)   PRAPARE - Administrator, Civil Service (Medical): No    Lack of Transportation (Non-Medical): No  Physical Activity: Insufficiently Active (02/17/2024)   Exercise Vital Sign    Days of Exercise per Week: 7 days    Minutes of Exercise per Session: 20 min  Stress: No Stress Concern Present (02/17/2024)   [employer] of Occupational Health - Occupational Stress Questionnaire    Feeling of Stress: Only a little  Social Connections: Socially Integrated (02/17/2024)   Social Connection and Isolation Panel    Frequency of Communication with Friends and  Family: More than three times a week    Frequency of Social Gatherings with Friends and Family: More than three times a week    Attends Religious Services: More than 4 times per year    Active Member of [employer] or Organizations: Yes    Attends Engineer, structural: More than 4 times per year    Marital Status: Married    Tobacco Counseling Counseling given: Yes    Clinical Intake:  Pre-visit preparation completed: Yes  Pain : No/denies pain     BMI - recorded: 28.29 Nutritional Status: BMI 25 -29 Overweight Nutritional Risks: None Diabetes: No  No results found for:  HGBA1C   How often do you need to have someone help you when you read instructions, pamphlets, or other written materials from your doctor or pharmacy?: 1 - Never  Interpreter Needed?: No  Information entered by :: alia t/cma   Activities of Daily Living     02/17/2024    1:17 PM  In your present state of health, do you have any difficulty performing the following activities:  Hearing? 0  Vision? 0  Difficulty concentrating or making decisions? 0  Walking or climbing stairs? 0  Dressing or bathing? 0  Doing errands, shopping? 0  Preparing Food and eating ? N  Using the Toilet? N  In the past six months, have you accidently leaked urine? N  Do you have problems with loss of bowel control? Y  Managing your Medications? N  Managing your Finances? N  Housekeeping or managing your Housekeeping? N    Patient Care Team: Gladis Mustard, FNP as PCP - General (Nurse Practitioner)  I have updated your Care Teams any recent Medical Services you may have received from other providers in the past year.     Assessment:   This is a routine wellness examination for Syndi.  Hearing/Vision screen Hearing Screening - Comments:: Pt denies hearing dif Vision Screening - Comments:: Pt wear glasses/pt goes to [facility] [address] [facility] [place], [place]/last ov 10/25   Goals Addressed             This Visit's  Progress    Patient Stated   On track    Wants to get her strength and energy back so she can do more       Depression Screen     02/17/2024    1:26 PM 05/17/2023   12:13 PM 02/15/2023    9:51 AM 10/27/2022   10:07 AM 04/27/2022   10:05 AM 02/10/2022    9:52 AM 10/30/2021    9:47 AM  PHQ 2/9 Scores  PHQ - 2 Score 0 0 0 3 1 0 0  PHQ- 9 Score  1  9 1  3     Fall Risk     02/17/2024    1:16 PM 05/17/2023   12:13 PM 02/15/2023    9:49 AM 02/14/2023    7:05 PM 10/27/2022   10:07 AM  Fall Risk   Falls in the past year? 1 0 0 0 0  Number falls in past yr: 0  0    Injury with Fall? 0  0 0   Risk for fall due to : Impaired balance/gait;Impaired mobility  No Fall Risks    Follow up   Falls prevention discussed      MEDICARE RISK AT HOME:  Medicare Risk at Home Any stairs in or around the home?: Yes If so, are there any without handrails?: Yes Home free of loose throw rugs in walkways, pet beds, electrical cords, etc?: Yes Adequate lighting in your home to reduce risk of falls?: Yes Life alert?: No Use of a cane, walker or w/c?: No Grab bars in the bathroom?: Yes Shower chair or bench in shower?: Yes Elevated toilet seat or a handicapped toilet?: Yes  TIMED UP AND GO:  Was the test performed?  no  Cognitive Function: 6CIT completed        02/17/2024    1:20 PM 02/15/2023    9:53 AM 02/10/2022    9:54 AM  6CIT Screen  What Year? 0 points 0 points 0 points  What month? 0 points 0 points  0 points  What time? 0 points 0 points 0 points  Count back from 20 0 points 0 points 0 points  Months in reverse 0 points 0 points 0 points  Repeat phrase 0 points 0 points 0 points  Total Score 0 points 0 points 0 points    Immunizations Immunization History  Administered Date(s) Administered   Fluad Quad(high Dose 65+) 04/26/2019, 04/29/2020, 04/30/2021, 04/27/2022   Fluad Trivalent(High Dose 65+) 05/17/2023   Influenza Split 04/20/2013   Influenza, High Dose Seasonal PF 04/30/2016,  04/21/2017, 04/22/2018   Influenza,inj,Quad PF,6+ Mos 04/17/2014, 05/07/2015   Moderna Covid-19 Fall Seasonal Vaccine 25yrs & older 12/30/2023   Moderna Covid-19 Seasonal Vaccine 6 months thru 79years of age 19/30/2024   [facility] SARS-COV2 Booster Vaccination 05/23/2020   Moderna Sars-Covid-2 Vaccination 08/19/2019, 09/21/2019, 06/16/2022   Pneumococcal Conjugate-13 03/29/2015   Pneumococcal Polysaccharide-23 06/27/2011, 04/26/2019   Tdap 10/30/2021   Zoster Recombinant(Shingrix ) 06/27/2019, 04/30/2021   Zoster, Live 04/24/2011    Screening Tests Health Maintenance  Topic Date Due   MAMMOGRAM  05/16/2024 (Originally 04/22/2022)   DEXA SCAN  05/16/2024 (Originally 05/01/2023)   Colonoscopy  11/14/2024 (Originally 11/15/2022)   INFLUENZA VACCINE  02/25/2024   COVID-19 Vaccine (5 - Moderna risk 2024-25 season) 06/30/2024   Medicare Annual Wellness (AWV)  02/16/2025   DTaP/Tdap/Td (2 - Td or Tdap) 10/31/2031   Pneumococcal Vaccine: 50+ Years  Completed   Hepatitis C Screening  Completed   Zoster Vaccines- Shingrix   Completed   Hepatitis B Vaccines  Aged Out   HPV VACCINES  Aged Out   Meningococcal B Vaccine  Aged Out    Health Maintenance  There are no preventive care reminders to display for this patient.  Health Maintenance Items Addressed: See Nurse Notes at the end of this note  Additional Screening:  Vision Screening: Recommended annual ophthalmology exams for early detection of glaucoma and other disorders of the eye. Would you like a referral to an eye doctor? No    Dental Screening: Recommended annual dental exams for proper oral hygiene  Community Resource Referral / Chronic Care Management: CRR required this visit?  No   CCM required this visit?  No   Plan:    I have personally reviewed and noted the following in the patient's chart:   Medical and social history Use of alcohol, tobacco or illicit drugs  Current medications and supplements including opioid  prescriptions. Patient is not currently taking opioid prescriptions. Functional ability and status Nutritional status Physical activity Advanced directives List of other physicians Hospitalizations, surgeries, and ER visits in previous 12 months Vitals Screenings to include cognitive, depression, and falls Referrals and appointments  In addition, I have reviewed and discussed with patient certain preventive protocols, quality metrics, and best practice recommendations. A written personalized care plan for preventive services as well as general preventive health recommendations were provided to patient.   Ozie Ned, CMA   02/17/2024   After Visit Summary: (MyChart) Due to this being a telephonic visit, the after visit summary with patients personalized plan was offered to patient via MyChart   Notes: Nothing significant to report at this time.  I have reviewed and agree with the above AWV documentation.   Mary-Margaret Gladis, FNP

## 2024-03-03 ENCOUNTER — Encounter: Payer: Self-pay | Admitting: Gastroenterology

## 2024-03-03 ENCOUNTER — Ambulatory Visit: Admitting: Gastroenterology

## 2024-03-03 VITALS — BP 114/66 | HR 62 | Temp 97.3°F | Resp 11 | Ht 65.0 in | Wt 170.0 lb

## 2024-03-03 DIAGNOSIS — K5730 Diverticulosis of large intestine without perforation or abscess without bleeding: Secondary | ICD-10-CM | POA: Diagnosis not present

## 2024-03-03 DIAGNOSIS — D120 Benign neoplasm of cecum: Secondary | ICD-10-CM

## 2024-03-03 DIAGNOSIS — D125 Benign neoplasm of sigmoid colon: Secondary | ICD-10-CM

## 2024-03-03 DIAGNOSIS — E669 Obesity, unspecified: Secondary | ICD-10-CM | POA: Diagnosis not present

## 2024-03-03 DIAGNOSIS — Z860100 Personal history of colon polyps, unspecified: Secondary | ICD-10-CM

## 2024-03-03 DIAGNOSIS — K635 Polyp of colon: Secondary | ICD-10-CM

## 2024-03-03 DIAGNOSIS — I10 Essential (primary) hypertension: Secondary | ICD-10-CM | POA: Diagnosis not present

## 2024-03-03 DIAGNOSIS — R197 Diarrhea, unspecified: Secondary | ICD-10-CM

## 2024-03-03 DIAGNOSIS — I493 Ventricular premature depolarization: Secondary | ICD-10-CM | POA: Diagnosis not present

## 2024-03-03 DIAGNOSIS — Z1211 Encounter for screening for malignant neoplasm of colon: Secondary | ICD-10-CM | POA: Diagnosis not present

## 2024-03-03 DIAGNOSIS — D123 Benign neoplasm of transverse colon: Secondary | ICD-10-CM

## 2024-03-03 DIAGNOSIS — Z860101 Personal history of adenomatous and serrated colon polyps: Secondary | ICD-10-CM

## 2024-03-03 MED ORDER — SODIUM CHLORIDE 0.9 % IV SOLN: 500.0000 mL | Freq: Once | INTRAVENOUS | Status: DC

## 2024-03-03 NOTE — Patient Instructions (Signed)
   Handouts provided about diverticulosis and polyps.  Resume previous diet.  Continue present medications.  Would recommend against any further colon cancer screening/polyp surveillance based on patient's age and lack of high risk polyps.  Recommend daily fiber supplement to reduce risk of diverticular complications and to improve bulk/consistency.  YOU HAD AN ENDOSCOPIC PROCEDURE TODAY AT THE [facility]:   Refer to the procedure report that was given to you for any specific questions about what was found during the examination.  If the procedure report does not answer your questions, please call your gastroenterologist to clarify.  If you requested that your care partner not be given the details of your procedure findings, then the procedure report has been included in a sealed envelope for you to review at your convenience later.  YOU SHOULD EXPECT: Some feelings of bloating in the abdomen. Passage of more gas than usual.  Walking can help get rid of the air that was put into your GI tract during the procedure and reduce the bloating. If you had a lower endoscopy (such as a colonoscopy or flexible sigmoidoscopy) you may notice spotting of blood in your stool or on the toilet paper. If you underwent a bowel prep for your procedure, you may not have a normal bowel movement for a few days.  Please Note:  You might notice some irritation and congestion in your nose or some drainage.  This is from the oxygen used during your procedure.  There is no need for concern and it should clear up in a day or so.  SYMPTOMS TO REPORT IMMEDIATELY:  Following lower endoscopy (colonoscopy or flexible sigmoidoscopy):  Excessive amounts of blood in the stool  Significant tenderness or worsening of abdominal pains  Swelling of the abdomen that is new, acute  Fever of 100F or higher  For urgent or emergent issues, a gastroenterologist can be reached at any hour by calling [phone]. Do not  use MyChart messaging for urgent concerns.    DIET:  We do recommend a small meal at first, but then you may proceed to your regular diet.  Drink plenty of fluids but you should avoid alcoholic beverages for 24 hours.  ACTIVITY:  You should plan to take it easy for the rest of today and you should NOT DRIVE or use heavy machinery until tomorrow (because of the sedation medicines used during the test).    FOLLOW UP: Our staff will call the number listed on your records the next business day following your procedure.  We will call around 7:15- 8:00 am to check on you and address any questions or concerns that you may have regarding the information given to you following your procedure. If we do not reach you, we will leave a message.     If any biopsies were taken you will be contacted by phone or by letter within the next 1-3 weeks.  Please call us  at [phone] if you have not heard about the biopsies in 3 weeks.    SIGNATURES/CONFIDENTIALITY: You and/or your care partner have signed paperwork which will be entered into your electronic medical record.  These signatures attest to the fact that that the information above on your After Visit Summary has been reviewed and is understood.  Full responsibility of the confidentiality of this discharge information lies with you and/or your care-partner.

## 2024-03-03 NOTE — Op Note (Signed)
 [facility] Patient Name: Amanda Park Procedure Date: 03/03/2024 1:34 PM MRN: [id number] Endoscopist: Glendia E. Stacia , MD, [id number] Age: 78 Referring MD:  Date of Birth: 08-Feb-1946 Gender: Female Account #: [id number] Procedure:                Colonoscopy Indications:              Surveillance: Personal history of adenomatous                            polyps on last colonoscopy > 5 years ago,                            Incidental diarrhea noted Medicines:                Monitored Anesthesia Care Procedure:                Pre-Anesthesia Assessment:                           - Prior to the procedure, a History and Physical                            was performed, and patient medications and                            allergies were reviewed. The patient's tolerance of                            previous anesthesia was also reviewed. The risks                            and benefits of the procedure and the sedation                            options and risks were discussed with the patient.                            All questions were answered, and informed consent                            was obtained. Prior Anticoagulants: The patient has                            taken no anticoagulant or antiplatelet agents. ASA                            Grade Assessment: III - A patient with severe                            systemic disease. After reviewing the risks and                            benefits, the patient was deemed in satisfactory  condition to undergo the procedure.                           After obtaining informed consent, the colonoscope                            was passed under direct vision. Throughout the                            procedure, the patient's blood pressure, pulse, and                            oxygen saturations were monitored continuously. The                            Colonoscope was introduced through  the anus and                            advanced to the the terminal ileum, with                            identification of the appendiceal orifice and IC                            valve. The colonoscopy was performed without                            difficulty. The patient tolerated the procedure                            well. The quality of the bowel preparation was                            good. The terminal ileum, ileocecal valve,                            appendiceal orifice, and rectum were photographed.                            The bowel preparation used was SUPREP via split                            dose instruction. Scope In: 1:41:24 PM Scope Out: 2:00:51 PM Scope Withdrawal Time: 0 hours 14 minutes 10 seconds  Total Procedure Duration: 0 hours 19 minutes 27 seconds  Findings:                 The perianal and digital rectal examinations were                            normal. Pertinent negatives include normal                            sphincter tone and no palpable rectal lesions.  An 8 mm polyp was found in the sigmoid colon. The                            polyp was flat. The polyp was removed with a cold                            snare. Resection and retrieval were complete.                            Estimated blood loss was minimal.                           A 4 mm polyp was found in the cecum. The polyp was                            sessile. The polyp was removed with a cold snare.                            Resection and retrieval were complete. Estimated                            blood loss was minimal.                           A 1 mm polyp was found in the cecum. The polyp was                            sessile. The polyp was removed with a cold biopsy                            forceps. Resection and retrieval were complete.                            Estimated blood loss was minimal.                           A 3 mm polyp was  found in the transverse colon. The                            polyp was sessile. The polyp was removed with a                            cold snare. Resection and retrieval were complete.                            Estimated blood loss was minimal.                           Normal mucosa was found in the entire colon.                            Biopsies for histology were taken with a cold  forceps from the ascending colon, transverse colon,                            descending colon and sigmoid colon for evaluation                            of microscopic colitis. Estimated blood loss was                            minimal.                           Many medium-mouthed and small-mouthed diverticula                            were found in the sigmoid colon and descending                            colon.                           The exam was otherwise normal throughout the                            examined colon.                           The terminal ileum appeared normal.                           The retroflexed view of the distal rectum and anal                            verge was normal and showed no anal or rectal                            abnormalities. Complications:            No immediate complications. Estimated Blood Loss:     Estimated blood loss was minimal. Impression:               - One 8 mm polyp in the sigmoid colon, removed with                            a cold snare. Resected and retrieved.                           - One 4 mm polyp in the cecum, removed with a cold                            snare. Resected and retrieved.                           - One 1 mm polyp in the cecum, removed with a cold  biopsy forceps. Resected and retrieved.                           - One 3 mm polyp in the transverse colon, removed                            with a cold snare. Resected and retrieved.                            - Normal mucosa in the entire examined colon.                            Biopsied.                           - Moderate diverticulosis in the sigmoid colon and                            in the descending colon.                           - The examined portion of the ileum was normal.                           - The distal rectum and anal verge are normal on                            retroflexion view. Recommendation:           - Patient has a contact number available for                            emergencies. The signs and symptoms of potential                            delayed complications were discussed with the                            patient. Return to normal activities tomorrow.                            Written discharge instructions were provided to the                            patient.                           - Resume previous diet.                           - Continue present medications.                           - Await pathology results.                           -  Would recommend against any further colon cancer                            screening/polyp surveillance based on patient's age                            and lack of high risk polyps.                           - Recommend daily fiber supplement to reduce risk                            of diverticular complications and to improve stool                            bulk/consistency. Geneva Pallas E. Stacia, MD 03/03/2024 2:10:30 PM This report has been signed electronically.

## 2024-03-03 NOTE — Progress Notes (Signed)
 Called to room to assist during endoscopic procedure.  Patient ID and intended procedure confirmed with present staff. Received instructions for my participation in the procedure from the performing physician.

## 2024-03-03 NOTE — Progress Notes (Signed)
 [facility] History and Physical   Primary Care Physician:  Gladis Mustard, FNP   Reason for Procedure:   History of colon polyps/colon cancer screening  Plan:    Colonoscopy     HPI: Amanda Park is a 78 y.o. female undergoing surveillance colonoscopy and to evaluate persistent diarrhea for the past 2 years. She has no family history of colon cancer and no chronic GI symptoms.  Her last colonoscopy was in 2017 and a single small tubular adenoma was removed.   Past Medical History:  Diagnosis Date   Allergy    Breast cancer (HCC) 2023   Colon polyps 2018   Hyperlipidemia    Hypertension    Obesity    Osteopenia    PVC (premature ventricular contraction)     Past Surgical History:  Procedure Laterality Date   ABDOMINAL HYSTERECTOMY     APPENDECTOMY     BREAST BIOPSY Bilateral 10/24/2020   Fibroadenoma bilaterally   cyst removed from left arm pit  03/15/2017   FOOT SURGERY     TEMPOROMANDIBULAR JOINT SURGERY Bilateral    TMJ ARTHROPLASTY      Prior to Admission medications   Medication Sig Start Date End Date Taking? Authorizing Provider  anastrozole (ARIMIDEX) 1 MG tablet Take 1 mg by mouth daily. 01/14/22  Yes [provider]  aspirin EC 81 MG tablet Take 81 mg by mouth daily.   Yes [provider]  CALCIUM  PO Take by mouth.   Yes [provider]  escitalopram  (LEXAPRO ) 10 MG tablet Take 1 tablet (10 mg total) by mouth daily. 11/15/23  Yes Martin, Mary-Margaret, FNP  levocetirizine (XYZAL) 5 MG tablet Take 5 mg by mouth every evening.   Yes [provider]  metoprolol  succinate (TOPROL -XL) 25 MG 24 hr tablet Take 1 tablet (25 mg total) by mouth daily. 11/15/23  Yes Martin, Mary-Margaret, FNP  rOPINIRole  (REQUIP ) 3 MG tablet Take 1 tablet (3 mg total) by mouth at bedtime. 11/15/23  Yes Martin, Mary-Margaret, FNP  rosuvastatin  (CRESTOR ) 10 MG tablet Take 1 tablet (10 mg total) by mouth daily. 11/15/23  Yes Gladis,  Mary-Margaret, FNP  VITAMIN D  PO Take by mouth.   Yes [provider]    Current Outpatient Medications  Medication Sig Dispense Refill   anastrozole (ARIMIDEX) 1 MG tablet Take 1 mg by mouth daily.     aspirin EC 81 MG tablet Take 81 mg by mouth daily.     CALCIUM  PO Take by mouth.     escitalopram  (LEXAPRO ) 10 MG tablet Take 1 tablet (10 mg total) by mouth daily. 90 tablet 1   levocetirizine (XYZAL) 5 MG tablet Take 5 mg by mouth every evening.     metoprolol  succinate (TOPROL -XL) 25 MG 24 hr tablet Take 1 tablet (25 mg total) by mouth daily. 90 tablet 1   rOPINIRole  (REQUIP ) 3 MG tablet Take 1 tablet (3 mg total) by mouth at bedtime. 90 tablet 1   rosuvastatin  (CRESTOR ) 10 MG tablet Take 1 tablet (10 mg total) by mouth daily. 90 tablet 1   VITAMIN D  PO Take by mouth.     Current Facility-Administered Medications  Medication Dose Route Frequency Provider Last Rate Last Admin   0.9 %  sodium chloride  infusion  500 mL Intravenous Once Larah Kuntzman E, MD        Allergies as of 03/03/2024   (No Known Allergies)    Family History  Problem Relation Age of Onset   Hypertension Mother  Kidney disease Mother    Dementia Mother    Heart disease Mother    Breast cancer Mother    Prostate cancer Father    Kidney disease Sister    Parkinson's disease Sister    Diabetes Sister    Heart disease Sister    Dementia Sister    Prostate cancer Brother    Stomach cancer Paternal Uncle    Heart disease Paternal Grandfather    Colon cancer Neg Hx    Esophageal cancer Neg Hx    Rectal cancer Neg Hx     Social History   Socioeconomic History   Marital status: Married    Spouse name: Deward Boom   Number of children: 2   Years of education: 12   Highest education level: Some college, no degree  Occupational History   Occupation: Retired  Tobacco Use   Smoking status: Never   Smokeless tobacco: Never  Vaping Use   Vaping status: Never Used  Substance and Sexual  Activity   Alcohol use: No    Alcohol/week: 0.0 standard drinks of alcohol   Drug use: No   Sexual activity: Not Currently  Other Topics Concern   Not on file  Social History Narrative   Married, lives with husband, very active in church, outdoors, gardening   Her washer and dryer and sewing machine are in the basement - she uses the stairs several times per day   Social Drivers of Health   Financial Resource Strain: Low Risk  (02/17/2024)   Overall Financial Resource Strain (CARDIA)    Difficulty of Paying Living Expenses: Not hard at all  Food Insecurity: No Food Insecurity (02/17/2024)   Hunger Vital Sign    Worried About Running Out of Food in the Last Year: Never true    Ran Out of Food in the Last Year: Never true  Transportation Needs: No Transportation Needs (02/17/2024)   PRAPARE - Administrator, Civil Service (Medical): No    Lack of Transportation (Non-Medical): No  Physical Activity: Insufficiently Active (02/17/2024)   Exercise Vital Sign    Days of Exercise per Week: 7 days    Minutes of Exercise per Session: 20 min  Stress: No Stress Concern Present (02/17/2024)   [employer] of Occupational Health - Occupational Stress Questionnaire    Feeling of Stress: Only a little  Social Connections: Socially Integrated (02/17/2024)   Social Connection and Isolation Panel    Frequency of Communication with Friends and Family: More than three times a week    Frequency of Social Gatherings with Friends and Family: More than three times a week    Attends Religious Services: More than 4 times per year    Active Member of [employer] or Organizations: Yes    Attends Engineer, structural: More than 4 times per year    Marital Status: Married  Catering manager Violence: Not At Risk (02/17/2024)   Humiliation, Afraid, Rape, and Kick questionnaire    Fear of Current or Ex-Partner: No    Emotionally Abused: No    Physically Abused: No    Sexually Abused: No     Review of Systems:  All other review of systems negative except as mentioned in the HPI.  Physical Exam: Vital signs BP (!) 132/58   Pulse (!) 47   Temp (!) 97.3 F (36.3 C) (Temporal)   Ht 5' 5 (1.651 m)   Wt 170 lb (77.1 kg)   SpO2 96%   BMI 28.29  kg/m   General:   Alert,  Well-developed, well-nourished, pleasant and cooperative in NAD Airway:  Mallampati 2 Lungs:  Clear throughout to auscultation.   Heart:  Regular rate and rhythm; no murmurs, clicks, rubs,  or gallops. Abdomen:  Soft, nontender and nondistended. Normal bowel sounds.   Neuro/Psych:  Normal mood and affect. A and O x 3   Hensley Aziz E. Stacia, MD [facility]

## 2024-03-03 NOTE — Progress Notes (Signed)
 Report to PACU, RN, vss, BBS= Clear.

## 2024-03-06 ENCOUNTER — Telehealth: Payer: Self-pay | Admitting: *Deleted

## 2024-03-06 NOTE — Telephone Encounter (Signed)
 Left message on f/u call

## 2024-03-08 LAB — SURGICAL PATHOLOGY

## 2024-03-12 ENCOUNTER — Ambulatory Visit: Payer: Self-pay | Admitting: Gastroenterology

## 2024-03-12 NOTE — Progress Notes (Signed)
 Amanda Park,  Three polyps that I removed during your recent procedure were completely benign but were proven to be pre-cancerous polyps that MAY have grown into cancers if they had not been removed.  The largest polyp in the sigmoid colon was not precancerous.  Studies shows that at least 20% of women over age 79 and 30% of men over age 42 have pre-cancerous polyps.  Based on current nationally recognized surveillance guidelines, it would be recommended that you have a repeat colonoscopy in 3 years.   However, because colon cancer screening after age 20 is done on a case-by-case basis, taking into account the patient's risk factors for colon cancer, as well as comorbidities and life expectancy, I would recommend you make an appointment with me in 3 years to discuss the risks/benefits of further colon cancer screening, if that is something you would want to consider at that time.  The biopsies taken from your colon were normal and there was no evidence of microscopic colitis or chronic inflammatory changes to explain your chronic diarrhea.

## 2024-03-31 ENCOUNTER — Ambulatory Visit: Admitting: Physician Assistant

## 2024-04-20 ENCOUNTER — Ambulatory Visit: Payer: Self-pay

## 2024-04-20 ENCOUNTER — Encounter: Payer: Self-pay | Admitting: Family

## 2024-04-20 ENCOUNTER — Ambulatory Visit (INDEPENDENT_AMBULATORY_CARE_PROVIDER_SITE_OTHER): Admitting: Family

## 2024-04-20 VITALS — BP 147/72 | HR 53 | Temp 97.1°F | Ht 65.0 in | Wt 174.0 lb

## 2024-04-20 DIAGNOSIS — M5442 Lumbago with sciatica, left side: Secondary | ICD-10-CM

## 2024-04-20 DIAGNOSIS — Z23 Encounter for immunization: Secondary | ICD-10-CM | POA: Diagnosis not present

## 2024-04-20 MED ORDER — METHYLPREDNISOLONE ACETATE 80 MG/ML IJ SUSP
80.0000 mg | Freq: Once | INTRAMUSCULAR | Status: AC
  Administered 2024-04-20: 80 mg via INTRAMUSCULAR

## 2024-04-20 MED ORDER — BACLOFEN 10 MG PO TABS: 10.0000 mg | ORAL_TABLET | Freq: Three times a day (TID) | ORAL | 0 refills | Status: AC

## 2024-04-20 MED ORDER — KETOROLAC TROMETHAMINE 60 MG/2ML IM SOLN
60.0000 mg | Freq: Once | INTRAMUSCULAR | Status: AC
  Administered 2024-04-20: 60 mg via INTRAMUSCULAR

## 2024-04-20 NOTE — Telephone Encounter (Signed)
  FYI Only or Action Required?: Action required by provider: request for appointment.  Patient was last seen in primary care on 11/15/2023 by Gladis Mustard, FNP.  Called Nurse Triage reporting Back Pain.  Symptoms began today.  Interventions attempted: Prescription medications:  SABRA  Symptoms are: unchanged.Low back pain that radiates down leg.  Triage Disposition: No disposition on file.  Patient/caregiver understands and will follow disposition?:     Copied from CRM #[id number]. Topic: Clinical - Red Word Triage >> Apr 20, 2024  8:09 AM Susanna ORN wrote: Red Word that prompted transfer to Nurse Triage: Patient states she can't walk, can't sit down nor hardly walk with her back pain. States it so bad. Wants to be seen today, if possible. Answer Assessment - Initial Assessment Questions 1. ONSET: When did the pain begin? (e.g., minutes, hours, days)     today 2. LOCATION: Where does it hurt? (upper, mid or lower back)     Low and down leg left 3. SEVERITY: How bad is the pain?  (e.g., Scale 1-10; mild, moderate, or severe)     10 4. PATTERN: Is the pain constant? (e.g., yes, no; constant, intermittent)      constant 5. RADIATION: Does the pain shoot into your legs or somewhere else?     yes 6. CAUSE:  What do you think is causing the back pain?       7. BACK OVERUSE:  Any recent lifting of heavy objects, strenuous work or exercise?     yes 8. MEDICINES: What have you taken so far for the pain? (e.g., nothing, acetaminophen, NSAIDS)     Tramadol  9. NEUROLOGIC SYMPTOMS: Do you have any weakness, numbness, or problems with bowel/bladder control?     no 10. OTHER SYMPTOMS: Do you have any other symptoms? (e.g., fever, abdomen pain, burning with urination, blood in urine)       no 11. PREGNANCY: Is there any chance you are pregnant? When was your last menstrual period?       no  Protocols used: Back Pain-A-AH

## 2024-04-20 NOTE — Progress Notes (Signed)
 Subjective:    Patient ID: Amanda Park, female    DOB: Sep 16, 1945, 78 y.o.   MRN: [id number]  Chief Complaint  Patient presents with   Back Pain    Thinks she pulled muscle. Started in left leg and moved up. Started yesterday.   Pt presents to the office today with back pain and left leg that started yesterday.  Back Pain This is a new problem. The current episode started yesterday. The problem occurs constantly. The problem has been gradually worsening since onset. The pain is present in the gluteal and lumbar spine. The quality of the pain is described as aching. The pain radiates to the left thigh. The pain is moderate. Associated symptoms include leg pain and weakness. Pertinent negatives include no tingling. She has tried bed rest (ultram ) for the symptoms. The treatment provided mild relief.      Review of Systems  Musculoskeletal:  Positive for back pain.  Neurological:  Positive for weakness. Negative for tingling.  All other systems reviewed and are negative.   Social History   Socioeconomic History   Marital status: Married    Spouse name: Deward Boom   Number of children: 2   Years of education: 12   Highest education level: Some college, no degree  Occupational History   Occupation: Retired  Tobacco Use   Smoking status: Never   Smokeless tobacco: Never  Vaping Use   Vaping status: Never Used  Substance and Sexual Activity   Alcohol use: No    Alcohol/week: 0.0 standard drinks of alcohol   Drug use: No   Sexual activity: Not Currently  Other Topics Concern   Not on file  Social History Narrative   Married, lives with husband, very active in church, outdoors, gardening   Her washer and dryer and sewing machine are in the basement - she uses the stairs several times per day   Social Drivers of Health   Financial Resource Strain: Low Risk  (02/17/2024)   Overall Financial Resource Strain (CARDIA)    Difficulty of Paying Living Expenses: Not hard at all   Food Insecurity: No Food Insecurity (02/17/2024)   Hunger Vital Sign    Worried About Running Out of Food in the Last Year: Never true    Ran Out of Food in the Last Year: Never true  Transportation Needs: No Transportation Needs (02/17/2024)   PRAPARE - Administrator, Civil Service (Medical): No    Lack of Transportation (Non-Medical): No  Physical Activity: Insufficiently Active (02/17/2024)   Exercise Vital Sign    Days of Exercise per Week: 7 days    Minutes of Exercise per Session: 20 min  Stress: No Stress Concern Present (02/17/2024)   [employer] of Occupational Health - Occupational Stress Questionnaire    Feeling of Stress: Only a little  Social Connections: Socially Integrated (02/17/2024)   Social Connection and Isolation Panel    Frequency of Communication with Friends and Family: More than three times a week    Frequency of Social Gatherings with Friends and Family: More than three times a week    Attends Religious Services: More than 4 times per year    Active Member of [employer] or Organizations: Yes    Attends Engineer, structural: More than 4 times per year    Marital Status: Married   Family History  Problem Relation Age of Onset   Hypertension Mother    Kidney disease Mother    Dementia Mother  Heart disease Mother    Breast cancer Mother    Prostate cancer Father    Kidney disease Sister    Parkinson's disease Sister    Diabetes Sister    Heart disease Sister    Dementia Sister    Prostate cancer Brother    Stomach cancer Paternal Uncle    Heart disease Paternal Grandfather    Colon cancer Neg Hx    Esophageal cancer Neg Hx    Rectal cancer Neg Hx         Objective:   Physical Exam Vitals reviewed.  Constitutional:      General: She is not in acute distress.    Appearance: She is well-developed.  HENT:     Head: Normocephalic and atraumatic.  Eyes:     Pupils: Pupils are equal, round, and reactive to light.  Neck:      Thyroid : No thyromegaly.  Cardiovascular:     Rate and Rhythm: Normal rate and regular rhythm.     Heart sounds: Normal heart sounds. No murmur heard. Pulmonary:     Effort: Pulmonary effort is normal. No respiratory distress.     Breath sounds: Normal breath sounds. No wheezing.  Abdominal:     General: Bowel sounds are normal. There is no distension.     Palpations: Abdomen is soft.     Tenderness: There is no abdominal tenderness.  Musculoskeletal:        General: No tenderness.     Cervical back: Normal range of motion and neck supple.     Comments: Pain in left lower lumbar with flexion and extension. Negative homan sign.   Skin:    General: Skin is warm and dry.  Neurological:     Mental Status: She is alert and oriented to person, place, and time.     Cranial Nerves: No cranial nerve deficit.     Deep Tendon Reflexes: Reflexes are normal and symmetric.  Psychiatric:        Behavior: Behavior normal.        Thought Content: Thought content normal.        Judgment: Judgment normal.       BP (!) 147/72   Pulse (!) 53   Temp (!) 97.1 F (36.2 C) (Temporal)   Ht 5' 5 (1.651 m)   Wt 174 lb (78.9 kg)   BMI 28.96 kg/m      Assessment & Plan:  Jamie-Lee Galdamez comes in today with chief complaint of Back Pain (Thinks she pulled muscle. Started in left leg and moved up. Started yesterday.)   Diagnosis and orders addressed:  1. Encounter for immunization (Primary) - Flu vaccine HIGH DOSE PF(Fluzone Trivalent)  2. Acute left-sided low back pain with left-sided sciatica Rest Ice  ROM exercises  Can take Motrin TID prn, with food Baclofen  as needed, sedation precautions discussed Follow up if symptoms worsen or do not improve  - methylPREDNISolone  acetate (DEPO-MEDROL ) injection 80 mg - ketorolac  (TORADOL ) injection 60 mg - baclofen  (LIORESAL ) 10 MG tablet; Take 1 tablet (10 mg total) by mouth 3 (three) times daily.  Dispense: 45 each; Refill: 0    Bari Learn,  FNP

## 2024-04-20 NOTE — Patient Instructions (Signed)
 Acute Back Pain, Adult Acute back pain is sudden and usually short-lived. It is often caused by an injury to the muscles and tissues in the back. The injury may result from: A muscle, tendon, or ligament getting overstretched or torn. Ligaments are tissues that connect bones to each other. Lifting something improperly can cause a back strain. Wear and tear (degeneration) of the spinal disks. Spinal disks are circular tissue that provide cushioning between the bones of the spine (vertebrae). Twisting motions, such as while playing sports or doing yard work. A hit to the back. Arthritis. You may have a physical exam, lab tests, and imaging tests to find the cause of your pain. Acute back pain usually goes away with rest and home care. Follow these instructions at home: Managing pain, stiffness, and swelling Take over-the-counter and prescription medicines only as told by your health care provider. Treatment may include medicines for pain and inflammation that are taken by mouth or applied to the skin, or muscle relaxants. Your health care provider may recommend applying ice during the first 24-48 hours after your pain starts. To do this: Put ice in a plastic bag. Place a towel between your skin and the bag. Leave the ice on for 20 minutes, 2-3 times a day. Remove the ice if your skin turns bright red. This is very important. If you cannot feel pain, heat, or cold, you have a greater risk of damage to the area. If directed, apply heat to the affected area as often as told by your health care provider. Use the heat source that your health care provider recommends, such as a moist heat pack or a heating pad. Place a towel between your skin and the heat source. Leave the heat on for 20-30 minutes. Remove the heat if your skin turns bright red. This is especially important if you are unable to feel pain, heat, or cold. You have a greater risk of getting burned. Activity  Do not stay in bed. Staying in  bed for more than 1-2 days can delay your recovery. Sit up and stand up straight. Avoid leaning forward when you sit or hunching over when you stand. If you work at a desk, sit close to it so you do not need to lean over. Keep your chin tucked in. Keep your neck drawn back, and keep your elbows bent at a 90-degree angle (right angle). Sit high and close to the steering wheel when you drive. Add lower back (lumbar) support to your car seat, if needed. Take short walks on even surfaces as soon as you are able. Try to increase the length of time you walk each day. Do not sit, drive, or stand in one place for more than 30 minutes at a time. Sitting or standing for long periods of time can put stress on your back. Do not drive or use heavy machinery while taking prescription pain medicine. Use proper lifting techniques. When you bend and lift, use positions that put less stress on your back: [place] your knees. Keep the load close to your body. Avoid twisting. Exercise regularly as told by your health care provider. Exercising helps your back heal faster and helps prevent back injuries by keeping muscles strong and flexible. Work with a physical therapist to make a safe exercise program, as recommended by your health care provider. Do any exercises as told by your physical therapist. Lifestyle Maintain a healthy weight. Extra weight puts stress on your back and makes it difficult to have good  posture. Avoid activities or situations that make you feel anxious or stressed. Stress and anxiety increase muscle tension and can make back pain worse. Learn ways to manage anxiety and stress, such as through exercise. General instructions Sleep on a firm mattress in a comfortable position. Try lying on your side with your knees slightly bent. If you lie on your back, put a pillow under your knees. Keep your head and neck in a straight line with your spine (neutral position) when using electronic equipment like  smartphones or pads. To do this: Raise your smartphone or pad to look at it instead of bending your head or neck to look down. Put the smartphone or pad at the level of your face while looking at the screen. Follow your treatment plan as told by your health care provider. This may include: Cognitive or behavioral therapy. Acupuncture or massage therapy. Meditation or yoga. Contact a health care provider if: You have pain that is not relieved with rest or medicine. You have increasing pain going down into your legs or buttocks. Your pain does not improve after 2 weeks. You have pain at night. You lose weight without trying. You have a fever or chills. You develop nausea or vomiting. You develop abdominal pain. Get help right away if: You develop new bowel or bladder control problems. You have unusual weakness or numbness in your arms or legs. You feel faint. These symptoms may represent a serious problem that is an emergency. Do not wait to see if the symptoms will go away. Get medical help right away. Call your local emergency services (911 in the [place] Do not drive yourself to the hospital. Summary Acute back pain is sudden and usually short-lived. Use proper lifting techniques. When you bend and lift, use positions that put less stress on your back. Take over-the-counter and prescription medicines only as told by your health care provider, and apply heat or ice as told. This information is not intended to replace advice given to you by your health care provider. Make sure you discuss any questions you have with your health care provider. Document Revised: 10/04/2020 Document Reviewed: 10/04/2020 Elsevier Patient Education  2024 [employer].

## 2024-04-20 NOTE — Telephone Encounter (Signed)
 Pt has appt

## 2024-04-24 ENCOUNTER — Other Ambulatory Visit: Payer: Self-pay | Admitting: Nurse Practitioner

## 2024-04-24 DIAGNOSIS — F411 Generalized anxiety disorder: Secondary | ICD-10-CM

## 2024-04-24 DIAGNOSIS — I10 Essential (primary) hypertension: Secondary | ICD-10-CM

## 2024-05-08 ENCOUNTER — Ambulatory Visit: Admitting: Nurse Practitioner

## 2024-05-08 ENCOUNTER — Encounter: Payer: Self-pay | Admitting: Nurse Practitioner

## 2024-05-08 ENCOUNTER — Other Ambulatory Visit

## 2024-05-08 VITALS — BP 122/66 | HR 50 | Temp 96.6°F | Ht 65.0 in | Wt 171.0 lb

## 2024-05-08 DIAGNOSIS — E785 Hyperlipidemia, unspecified: Secondary | ICD-10-CM

## 2024-05-08 DIAGNOSIS — G2581 Restless legs syndrome: Secondary | ICD-10-CM

## 2024-05-08 DIAGNOSIS — K219 Gastro-esophageal reflux disease without esophagitis: Secondary | ICD-10-CM | POA: Diagnosis not present

## 2024-05-08 DIAGNOSIS — I10 Essential (primary) hypertension: Secondary | ICD-10-CM

## 2024-05-08 DIAGNOSIS — F411 Generalized anxiety disorder: Secondary | ICD-10-CM

## 2024-05-08 LAB — LIPID PANEL

## 2024-05-08 MED ORDER — ROPINIROLE HCL 3 MG PO TABS: 3.0000 mg | ORAL_TABLET | Freq: Every day | ORAL | 1 refills | Status: AC

## 2024-05-08 MED ORDER — METOPROLOL SUCCINATE ER 25 MG PO TB24: 25.0000 mg | ORAL_TABLET | Freq: Every day | ORAL | 1 refills | Status: AC

## 2024-05-08 MED ORDER — ROSUVASTATIN CALCIUM 10 MG PO TABS: 10.0000 mg | ORAL_TABLET | Freq: Every day | ORAL | 1 refills | Status: AC

## 2024-05-08 MED ORDER — ESCITALOPRAM OXALATE 10 MG PO TABS: 10.0000 mg | ORAL_TABLET | Freq: Every day | ORAL | 1 refills | Status: AC

## 2024-05-08 NOTE — Patient Instructions (Signed)
 Fall Prevention in the Home, Adult Falls can cause injuries and can happen to people of all ages. There are many things you can do to make your home safer and to help prevent falls. What actions can I take to prevent falls? General information Use good lighting in all rooms. Make sure to: Replace any light bulbs that burn out. Turn on the lights in dark areas and use night-lights. Keep items that you use often in easy-to-reach places. Lower the shelves around your home if needed. Move furniture so that there are clear paths around it. Do not use throw rugs or other things on the floor that can make you trip. If any of your floors are uneven, fix them. Add color or contrast paint or tape to clearly mark and help you see: Grab bars or handrails. First and last steps of staircases. Where the edge of each step is. If you use a ladder or stepladder: Make sure that it is fully opened. Do not climb a closed ladder. Make sure the sides of the ladder are locked in place. Have someone hold the ladder while you use it. Know where your pets are as you move through your home. What can I do in the bathroom?     Keep the floor dry. Clean up any water on the floor right away. Remove soap buildup in the bathtub or shower. Buildup makes bathtubs and showers slippery. Use non-skid mats or decals on the floor of the bathtub or shower. Attach bath mats securely with double-sided, non-slip rug tape. If you need to sit down in the shower, use a non-slip stool. Install grab bars by the toilet and in the bathtub and shower. Do not use towel bars as grab bars. What can I do in the bedroom? Make sure that you have a light by your bed that is easy to reach. Do not use any sheets or blankets on your bed that hang to the floor. Have a firm chair or bench with side arms that you can use for support when you get dressed. What can I do in the kitchen? Clean up any spills right away. If you need to reach something  above you, use a step stool with a grab bar. Keep electrical cords out of the way. Do not use floor polish or wax that makes floors slippery. What can I do with my stairs? Do not leave anything on the stairs. Make sure that you have a light switch at the top and the bottom of the stairs. Make sure that there are handrails on both sides of the stairs. Fix handrails that are broken or loose. Install non-slip stair treads on all your stairs if they do not have carpet. Avoid having throw rugs at the top or bottom of the stairs. Choose a carpet that does not hide the edge of the steps on the stairs. Make sure that the carpet is firmly attached to the stairs. Fix carpet that is loose or worn. What can I do on the outside of my home? Use bright outdoor lighting. Fix the edges of walkways and driveways and fix any cracks. Clear paths of anything that can make you trip, such as tools or rocks. Add color or contrast paint or tape to clearly mark and help you see anything that might make you trip as you walk through a door, such as a raised step or threshold. Trim any bushes or trees on paths to your home. Check to see if handrails are loose  or broken and that both sides of all steps have handrails. Install guardrails along the edges of any raised decks and porches. Have leaves, snow, or ice cleared regularly. Use sand, salt, or ice melter on paths if you live where there is ice and snow during the winter. Clean up any spills in your garage right away. This includes grease or oil spills. What other actions can I take? Review your medicines with your doctor. Some medicines can cause dizziness or changes in blood pressure, which increase your risk of falling. Wear shoes that: Have a low heel. Do not wear high heels. Have rubber bottoms and are closed at the toe. Feel good on your feet and fit well. Use tools that help you move around if needed. These include: Canes. Walkers. Scooters. Crutches. Ask  your doctor what else you can do to help prevent falls. This may include seeing a physical therapist to learn to do exercises to move better and get stronger. Where to find more information Centers for Disease Control and Prevention, [employer]: TonerPromos.no [employer] on Aging: BaseRingTones.pl National Institute on Aging: BaseRingTones.pl Contact a doctor if: You are afraid of falling at home. You feel weak, drowsy, or dizzy at home. You fall at home. Get help right away if you: Lose consciousness or have trouble moving after a fall. Have a fall that causes a head injury. These symptoms may be an emergency. Get help right away. Call 911. Do not wait to see if the symptoms will go away. Do not drive yourself to the hospital. This information is not intended to replace advice given to you by your health care provider. Make sure you discuss any questions you have with your health care provider. Document Revised: 03/16/2022 Document Reviewed: 03/16/2022 Elsevier Patient Education  2024 [employer].

## 2024-05-08 NOTE — Progress Notes (Signed)
 Subjective:    Patient ID: Amanda Park, female    DOB: February 21, 1946, 78 y.o.   MRN: [id number]   Chief Complaint: medical management of chronic issues     HPI:  Amanda Park is a 78 y.o. who identifies as a female who was assigned female at birth.   Social history: Lives with: husband Work history: retired   Water engineer in today for follow up of the following chronic medical issues:  1. Essential hypertension, benign No c/o chest pain, sob or headache. Does not check blood pressure at home. BP Readings from Last 3 Encounters:  04/20/24 (!) 147/72  03/03/24 114/66  02/17/24 138/74     2. Hyperlipidemia with target LDL less than 100 Does try to watch diet and stays very active. Lab Results  Component Value Date   CHOL 153 11/15/2023   HDL 48 11/15/2023   LDLCALC 81 11/15/2023   TRIG 135 11/15/2023   CHOLHDL 3.2 11/15/2023     3. Gastroesophageal reflux disease without esophagitis Only uses OTC meds as needed  4. RLS (restless legs syndrome) Is on mirapex  and that works well for her.  5. GAD (generalized anxiety disorder) Is on lexapro  daily    05/08/2024    9:51 AM 05/17/2023   12:13 PM 10/27/2022   10:07 AM 04/27/2022   10:06 AM  GAD 7 : Generalized Anxiety Score  Nervous, Anxious, on Edge 0 0 0 0  Control/stop worrying 0 0 0 0  Worry too much - different things 0 0 0 0  Trouble relaxing 0 0 0 0  Restless 0 0 0 0  Easily annoyed or irritable 0 0 0 0  Afraid - awful might happen 0 0 0 0  Total GAD 7 Score 0 0 0 0  Anxiety Difficulty Not difficult at all Not difficult at all Not difficult at all Not difficult at all     6. Infiltrating ductal carcinoma of left breast (HCC) Has completed all treatments. Is on arimidex and is doing well. Has yearly follow up with oncology. Due for repeat mammogram next month  and follow up with oncologist December 1.  7. BMI 29.0-29.9,adult Weight is up4 lbs Wt Readings from Last 3 Encounters:  04/20/24 174 lb (78.9 kg)   03/03/24 170 lb (77.1 kg)  02/17/24 170 lb (77.1 kg)   BMI Readings from Last 3 Encounters:  04/20/24 28.96 kg/m  03/03/24 28.29 kg/m  02/17/24 28.29 kg/m      New complaints: None today  No Known Allergies Outpatient Encounter Medications as of 05/08/2024  Medication Sig   anastrozole (ARIMIDEX) 1 MG tablet Take 1 mg by mouth daily.   aspirin EC 81 MG tablet Take 81 mg by mouth daily.   baclofen  (LIORESAL ) 10 MG tablet Take 1 tablet (10 mg total) by mouth 3 (three) times daily.   CALCIUM  PO Take by mouth.   escitalopram  (LEXAPRO ) 10 MG tablet TAKE 1 TABLET EVERY DAY   levocetirizine (XYZAL) 5 MG tablet Take 5 mg by mouth every evening.   metoprolol  succinate (TOPROL -XL) 25 MG 24 hr tablet TAKE 1 TABLET EVERY DAY   rOPINIRole  (REQUIP ) 3 MG tablet Take 1 tablet (3 mg total) by mouth at bedtime.   rosuvastatin  (CRESTOR ) 10 MG tablet Take 1 tablet (10 mg total) by mouth daily.   VITAMIN D  PO Take by mouth.   No facility-administered encounter medications on file as of 05/08/2024.    Past Surgical History:  Procedure Laterality Date   ABDOMINAL HYSTERECTOMY  APPENDECTOMY     BREAST BIOPSY Bilateral 10/24/2020   Fibroadenoma bilaterally   cyst removed from left arm pit  03/15/2017   FOOT SURGERY     TEMPOROMANDIBULAR JOINT SURGERY Bilateral    TMJ ARTHROPLASTY      Family History  Problem Relation Age of Onset   Hypertension Mother    Kidney disease Mother    Dementia Mother    Heart disease Mother    Breast cancer Mother    Prostate cancer Father    Kidney disease Sister    Parkinson's disease Sister    Diabetes Sister    Heart disease Sister    Dementia Sister    Prostate cancer Brother    Stomach cancer Paternal Uncle    Heart disease Paternal Grandfather    Colon cancer Neg Hx    Esophageal cancer Neg Hx    Rectal cancer Neg Hx       Controlled substance contract: n/a     Review of Systems  Constitutional:  Negative for diaphoresis.   Eyes:  Negative for pain.  Respiratory:  Negative for shortness of breath.   Cardiovascular:  Negative for chest pain, palpitations and leg swelling.  Gastrointestinal:  Negative for abdominal pain.  Endocrine: Negative for polydipsia.  Skin:  Negative for rash.  Neurological:  Negative for dizziness, weakness and headaches.  Hematological:  Does not bruise/bleed easily.  All other systems reviewed and are negative.      Objective:   Physical Exam Vitals and nursing note reviewed.  Constitutional:      General: She is not in acute distress.    Appearance: Normal appearance. She is well-developed.  HENT:     Head: Normocephalic.     Right Ear: Tympanic membrane normal.     Left Ear: Tympanic membrane normal.     Nose: Nose normal.     Mouth/Throat:     Mouth: Mucous membranes are moist.  Eyes:     Pupils: Pupils are equal, round, and reactive to light.  Neck:     Vascular: No carotid bruit or JVD.  Cardiovascular:     Rate and Rhythm: Normal rate and regular rhythm.     Heart sounds: Normal heart sounds.  Pulmonary:     Effort: Pulmonary effort is normal. No respiratory distress.     Breath sounds: Normal breath sounds. No wheezing or rales.  Chest:     Chest wall: No tenderness.  Abdominal:     General: Bowel sounds are normal. There is no distension or abdominal bruit.     Palpations: Abdomen is soft. There is no hepatomegaly, splenomegaly, mass or pulsatile mass.     Tenderness: There is no abdominal tenderness.  Musculoskeletal:        General: Normal range of motion.     Cervical back: Normal range of motion and neck supple.  Lymphadenopathy:     Cervical: No cervical adenopathy.  Skin:    General: Skin is warm and dry.  Neurological:     Mental Status: She is alert and oriented to person, place, and time.     Deep Tendon Reflexes: Reflexes are normal and symmetric.  Psychiatric:        Behavior: Behavior normal.        Thought Content: Thought content  normal.        Judgment: Judgment normal.    BP 122/66   Pulse (!) 50   Temp (!) 96.6 F (35.9 C) (Temporal)   Ht 5'  5 (1.651 m)   Wt 171 lb (77.6 kg)   SpO2 96%   BMI 28.46 kg/m           Assessment & Plan:   [name] comes in today with chief complaint of medical management of chronic issues    Diagnosis and orders addressed:  1. Essential hypertension, benign Low sodium diet - metoprolol  succinate (TOPROL -XL) 25 MG 24 hr tablet; Take 1 tablet (25 mg total) by mouth daily.  Dispense: 90 tablet; Refill: 1 - CBC with Differential/Platelet - CMP14+EGFR  2. Hyperlipidemia with target LDL less than 100 Low fat diet - rosuvastatin  (CRESTOR ) 10 MG tablet; Take 1 tablet (10 mg total) by mouth daily.  Dispense: 90 tablet; Refill: 1 - Lipid panel  3. Gastroesophageal reflux disease without esophagitis Avoid spicy foods Do not eat 2 hours prior to bedtime   4. RLS (restless legs syndrome) Keep legs warm at night - pramipexole  (MIRAPEX ) 0.25 MG tablet; Take 1 tablet (0.25 mg total) by mouth at bedtime.  Dispense: 90 tablet; Refill: 1  5. GAD (generalized anxiety disorder) Stress management - escitalopram  (LEXAPRO ) 10 MG tablet; Take 1 tablet (10 mg total) by mouth daily.  Dispense: 90 tablet; Refill: 1  6. Infiltrating ductal carcinoma of left breast [facility]) Keep oncology appointment  7. BMI 29.0-29.9,adult Discussed diet and exercise for person with BMI >25 Will recheck weight in 3-6 months    Labs pending Health Maintenance reviewed Diet and exercise encouraged  Follow up plan: 6 months   Mary-Margaret Gladis, FNP

## 2024-05-09 ENCOUNTER — Ambulatory Visit: Payer: Self-pay | Admitting: Nurse Practitioner

## 2024-05-09 LAB — CMP14+EGFR
ALT: 13 IU/L (ref 0–32)
AST: 13 IU/L (ref 0–40)
Albumin: 4.3 g/dL (ref 3.8–4.8)
Alkaline Phosphatase: 67 IU/L (ref 49–135)
BUN/Creatinine Ratio: 20 (ref 12–28)
BUN: 21 mg/dL (ref 8–27)
Bilirubin Total: 0.5 mg/dL (ref 0.0–1.2)
CO2: 20 mmol/L (ref 20–29)
Calcium: 9.5 mg/dL (ref 8.7–10.3)
Chloride: 106 mmol/L (ref 96–106)
Creatinine, Ser: 1.05 mg/dL — ABNORMAL HIGH (ref 0.57–1.00)
Globulin, Total: 2.1 g/dL (ref 1.5–4.5)
Glucose: 91 mg/dL (ref 70–99)
Potassium: 4.4 mmol/L (ref 3.5–5.2)
Sodium: 139 mmol/L (ref 134–144)
Total Protein: 6.4 g/dL (ref 6.0–8.5)
eGFR: 54 mL/min/1.73 — ABNORMAL LOW (ref >59)

## 2024-05-09 LAB — CBC WITH DIFFERENTIAL/PLATELET
Basophils Absolute: 0 x10E3/uL (ref 0.0–0.2)
Basos: 1 %
EOS (ABSOLUTE): 0.1 x10E3/uL (ref 0.0–0.4)
Eos: 1 %
Hematocrit: 40.2 % (ref 34.0–46.6)
Hemoglobin: 13.1 g/dL (ref 11.1–15.9)
Immature Grans (Abs): 0 x10E3/uL (ref 0.0–0.1)
Immature Granulocytes: 0 %
Lymphocytes Absolute: 1.6 x10E3/uL (ref 0.7–3.1)
Lymphs: 25 %
MCH: 29.5 pg (ref 26.6–33.0)
MCHC: 32.6 g/dL (ref 31.5–35.7)
MCV: 91 fL (ref 79–97)
Monocytes Absolute: 0.4 x10E3/uL (ref 0.1–0.9)
Monocytes: 6 %
Neutrophils Absolute: 4.2 x10E3/uL (ref 1.4–7.0)
Neutrophils: 67 %
Platelets: 194 x10E3/uL (ref 150–450)
RBC: 4.44 x10E6/uL (ref 3.77–5.28)
RDW: 13.3 % (ref 11.7–15.4)
WBC: 6.3 x10E3/uL (ref 3.4–10.8)

## 2024-05-09 LAB — LIPID PANEL
Cholesterol, Total: 157 mg/dL (ref 100–199)
HDL: 54 mg/dL (ref >39)
LDL CALC COMMENT:: 2.9 ratio (ref 0.0–4.4)
LDL Chol Calc (NIH): 85 mg/dL (ref 0–99)
Triglycerides: 95 mg/dL (ref 0–149)
VLDL Cholesterol Cal: 18 mg/dL (ref 5–40)

## 2024-06-13 DIAGNOSIS — C50912 Malignant neoplasm of unspecified site of left female breast: Secondary | ICD-10-CM | POA: Diagnosis not present

## 2024-06-13 DIAGNOSIS — N6489 Other specified disorders of breast: Secondary | ICD-10-CM | POA: Diagnosis not present

## 2024-06-13 DIAGNOSIS — R92333 Mammographic heterogeneous density, bilateral breasts: Secondary | ICD-10-CM | POA: Diagnosis not present

## 2024-06-28 DIAGNOSIS — Z79899 Other long term (current) drug therapy: Secondary | ICD-10-CM | POA: Diagnosis not present

## 2024-06-28 DIAGNOSIS — Z780 Asymptomatic menopausal state: Secondary | ICD-10-CM | POA: Diagnosis not present

## 2024-06-28 DIAGNOSIS — Z79811 Long term (current) use of aromatase inhibitors: Secondary | ICD-10-CM | POA: Diagnosis not present

## 2024-06-28 DIAGNOSIS — S300XXA Contusion of lower back and pelvis, initial encounter: Secondary | ICD-10-CM | POA: Diagnosis not present

## 2024-06-28 DIAGNOSIS — M25551 Pain in right hip: Secondary | ICD-10-CM | POA: Diagnosis not present

## 2024-06-28 DIAGNOSIS — W19XXXA Unspecified fall, initial encounter: Secondary | ICD-10-CM | POA: Diagnosis not present

## 2024-06-28 DIAGNOSIS — R4182 Altered mental status, unspecified: Secondary | ICD-10-CM | POA: Diagnosis not present

## 2024-06-28 DIAGNOSIS — E785 Hyperlipidemia, unspecified: Secondary | ICD-10-CM | POA: Diagnosis not present

## 2024-06-28 DIAGNOSIS — M8580 Other specified disorders of bone density and structure, unspecified site: Secondary | ICD-10-CM | POA: Diagnosis not present

## 2024-06-28 DIAGNOSIS — Z5181 Encounter for therapeutic drug level monitoring: Secondary | ICD-10-CM | POA: Diagnosis not present

## 2024-06-28 DIAGNOSIS — M4854XA Collapsed vertebra, not elsewhere classified, thoracic region, initial encounter for fracture: Secondary | ICD-10-CM | POA: Diagnosis not present

## 2024-06-28 DIAGNOSIS — Z7982 Long term (current) use of aspirin: Secondary | ICD-10-CM | POA: Diagnosis not present

## 2024-06-28 DIAGNOSIS — S0990XA Unspecified injury of head, initial encounter: Secondary | ICD-10-CM | POA: Diagnosis not present

## 2024-06-28 DIAGNOSIS — S22080A Wedge compression fracture of T11-T12 vertebra, initial encounter for closed fracture: Secondary | ICD-10-CM | POA: Diagnosis not present

## 2024-07-10 ENCOUNTER — Ambulatory Visit: Admitting: Nurse Practitioner

## 2024-07-10 ENCOUNTER — Encounter: Payer: Self-pay | Admitting: Nurse Practitioner

## 2024-07-10 VITALS — BP 170/87 | HR 66 | Temp 96.5°F | Ht 65.0 in | Wt 170.0 lb

## 2024-07-10 DIAGNOSIS — S14114A Complete lesion at C4 level of cervical spinal cord, initial encounter: Secondary | ICD-10-CM

## 2024-07-10 DIAGNOSIS — Z09 Encounter for follow-up examination after completed treatment for conditions other than malignant neoplasm: Secondary | ICD-10-CM

## 2024-07-10 DIAGNOSIS — M546 Pain in thoracic spine: Secondary | ICD-10-CM

## 2024-07-10 MED ORDER — HYDROCODONE-ACETAMINOPHEN 10-325 MG PO TABS: 1.0000 | ORAL_TABLET | Freq: Three times a day (TID) | ORAL | 0 refills | Status: AC | PRN

## 2024-07-10 MED ORDER — KETOROLAC TROMETHAMINE 60 MG/2ML IM SOLN
60.0000 mg | Freq: Once | INTRAMUSCULAR | Status: AC
  Administered 2024-07-10: 15:00:00 60 mg via INTRAMUSCULAR

## 2024-07-10 MED ORDER — METHYLPREDNISOLONE ACETATE 80 MG/ML IJ SUSP
80.0000 mg | Freq: Once | INTRAMUSCULAR | Status: AC
  Administered 2024-07-10: 15:00:00 80 mg via INTRAMUSCULAR

## 2024-07-10 NOTE — Patient Instructions (Signed)
 Acute Back Pain, Adult Acute back pain is sudden and usually short-lived. It is often caused by an injury to the muscles and tissues in the back. The injury may result from: A muscle, tendon, or ligament getting overstretched or torn. Ligaments are tissues that connect bones to each other. Lifting something improperly can cause a back strain. Wear and tear (degeneration) of the spinal disks. Spinal disks are circular tissue that provide cushioning between the bones of the spine (vertebrae). Twisting motions, such as while playing sports or doing yard work. A hit to the back. Arthritis. You may have a physical exam, lab tests, and imaging tests to find the cause of your pain. Acute back pain usually goes away with rest and home care. Follow these instructions at home: Managing pain, stiffness, and swelling Take over-the-counter and prescription medicines only as told by your health care provider. Treatment may include medicines for pain and inflammation that are taken by mouth or applied to the skin, or muscle relaxants. Your health care provider may recommend applying ice during the first 24-48 hours after your pain starts. To do this: Put ice in a plastic bag. Place a towel between your skin and the bag. Leave the ice on for 20 minutes, 2-3 times a day. Remove the ice if your skin turns bright red. This is very important. If you cannot feel pain, heat, or cold, you have a greater risk of damage to the area. If directed, apply heat to the affected area as often as told by your health care provider. Use the heat source that your health care provider recommends, such as a moist heat pack or a heating pad. Place a towel between your skin and the heat source. Leave the heat on for 20-30 minutes. Remove the heat if your skin turns bright red. This is especially important if you are unable to feel pain, heat, or cold. You have a greater risk of getting burned. Activity  Do not stay in bed. Staying in  bed for more than 1-2 days can delay your recovery. Sit up and stand up straight. Avoid leaning forward when you sit or hunching over when you stand. If you work at a desk, sit close to it so you do not need to lean over. Keep your chin tucked in. Keep your neck drawn back, and keep your elbows bent at a 90-degree angle (right angle). Sit high and close to the steering wheel when you drive. Add lower back (lumbar) support to your car seat, if needed. Take short walks on even surfaces as soon as you are able. Try to increase the length of time you walk each day. Do not sit, drive, or stand in one place for more than 30 minutes at a time. Sitting or standing for long periods of time can put stress on your back. Do not drive or use heavy machinery while taking prescription pain medicine. Use proper lifting techniques. When you bend and lift, use positions that put less stress on your back: [place] your knees. Keep the load close to your body. Avoid twisting. Exercise regularly as told by your health care provider. Exercising helps your back heal faster and helps prevent back injuries by keeping muscles strong and flexible. Work with a physical therapist to make a safe exercise program, as recommended by your health care provider. Do any exercises as told by your physical therapist. Lifestyle Maintain a healthy weight. Extra weight puts stress on your back and makes it difficult to have good  posture. Avoid activities or situations that make you feel anxious or stressed. Stress and anxiety increase muscle tension and can make back pain worse. Learn ways to manage anxiety and stress, such as through exercise. General instructions Sleep on a firm mattress in a comfortable position. Try lying on your side with your knees slightly bent. If you lie on your back, put a pillow under your knees. Keep your head and neck in a straight line with your spine (neutral position) when using electronic equipment like  smartphones or pads. To do this: Raise your smartphone or pad to look at it instead of bending your head or neck to look down. Put the smartphone or pad at the level of your face while looking at the screen. Follow your treatment plan as told by your health care provider. This may include: Cognitive or behavioral therapy. Acupuncture or massage therapy. Meditation or yoga. Contact a health care provider if: You have pain that is not relieved with rest or medicine. You have increasing pain going down into your legs or buttocks. Your pain does not improve after 2 weeks. You have pain at night. You lose weight without trying. You have a fever or chills. You develop nausea or vomiting. You develop abdominal pain. Get help right away if: You develop new bowel or bladder control problems. You have unusual weakness or numbness in your arms or legs. You feel faint. These symptoms may represent a serious problem that is an emergency. Do not wait to see if the symptoms will go away. Get medical help right away. Call your local emergency services (911 in the [place] Do not drive yourself to the hospital. Summary Acute back pain is sudden and usually short-lived. Use proper lifting techniques. When you bend and lift, use positions that put less stress on your back. Take over-the-counter and prescription medicines only as told by your health care provider, and apply heat or ice as told. This information is not intended to replace advice given to you by your health care provider. Make sure you discuss any questions you have with your health care provider. Document Revised: 10/04/2020 Document Reviewed: 10/04/2020 Elsevier Patient Education  2024 [employer].

## 2024-07-10 NOTE — Progress Notes (Signed)
 Subjective:    Patient ID: Amanda Park, female    DOB: Oct 14, 1945, 78 y.o.   MRN: [id number]   Chief Complaint: hospital follow up  HPI  Patient went to the ED on 06/28/24 with the following complaint:Patient states around midnight she was having really bad reflux and felt as if she needed to vomit she sat up on the side of the bed but then vomited on the floor and when she stood up she slipped and the vomit fell straight back onto her buttocks and her back. She also states she hit her head. Denies any loss of consciousness. Patient is complaining of severe pain in her lower back and buttocks. Denies any injury to her chest or her abdomen. Denies any upper back pain. She denies any neck pain. She denies any paresthesias or numbness. She has no tenderness in her hips.  CT scan of head neck and back were done. T12 compression fracture was noted , but not sure related to fall. She was discharged home with pain medication. Since discharge her back is still hurting. Rates back pain 10/10. She was only given pain meds for 3 days.   Incidental finding- C4 vertebral body sclerotic bone lesion is indeterminate. Metastatic disease should be excluded given history of breast cancer. Patient says her sister had tumor on her C4 vetrebrae and passed away.    Patient Active Problem List   Diagnosis Date Noted   GAD (generalized anxiety disorder) 04/27/2022   Osteopenia 01/14/2022   Infiltrating ductal carcinoma of left breast (HCC) 11/24/2021   Postmenopausal 11/24/2021   BMI 29.0-29.9,adult 09/13/2015   RLS (restless legs syndrome) 12/13/2013   GERD (gastroesophageal reflux disease) 12/13/2013   Essential hypertension, benign 10/28/2012   Hyperlipidemia with target LDL less than 100 10/28/2012   Allergic rhinitis 10/28/2012       Review of Systems  Musculoskeletal:  Positive for back pain.       Objective:   Physical Exam Constitutional:      Appearance: Normal appearance.   Cardiovascular:     Rate and Rhythm: Normal rate.     Heart sounds: Normal heart sounds.  Pulmonary:     Breath sounds: Normal breath sounds.  Musculoskeletal:     Comments: Rises slowly from sitting to standing Gait slow and steady Limited ROM of back due to pain with any movement  Neurological:     General: No focal deficit present.     Mental Status: She is alert and oriented to person, place, and time.  Psychiatric:        Mood and Affect: Mood normal.        Behavior: Behavior normal.     BP (!) 170/87   Pulse 66   Temp (!) 96.5 F (35.8 C) (Temporal)   Ht 5' 5 (1.651 m)   Wt 170 lb (77.1 kg)   SpO2 99%   BMI 28.29 kg/m        Assessment & Plan:   Amanda Park in today with chief complaint of Hospitalization Follow-up   1. Complete lesion at C4 level of cervical spinal cord, initial encounter (HCC) (Primary) Referral back to oncology - Ambulatory referral to Hematology / Oncology  2. Acute midline thoracic back pain Moist heat' rest - HYDROcodone -acetaminophen  (NORCO) 10-325 MG tablet; Take 1 tablet by mouth every 8 (eight) hours as needed.  Dispense: 30 tablet; Refill: 0 - methylPREDNISolone  acetate (DEPO-MEDROL ) injection 80 mg - ketorolac  (TORADOL ) injection 60 mg  3. Hospital follow up [facility]  records reviewed  The above assessment and management plan was discussed with the patient. The patient verbalized understanding of and has agreed to the management plan. Patient is aware to call the clinic if symptoms persist or worsen. Patient is aware when to return to the clinic for a follow-up visit. Patient educated on when it is appropriate to go to the emergency department.   Mary-Margaret Gladis, FNP

## 2024-11-03 ENCOUNTER — Ambulatory Visit: Payer: Self-pay | Admitting: Nurse Practitioner

## 2025-02-19 ENCOUNTER — Ambulatory Visit: Payer: Self-pay
